# Patient Record
Sex: Female | Born: 1960 | Race: Black or African American | Hispanic: No | Marital: Married | Smoking: Never smoker
Health system: Southern US, Community
[De-identification: ages and names within clinical notes are randomized; demographics above are authoritative.]

## PROBLEM LIST (undated history)

## (undated) DIAGNOSIS — E119 Type 2 diabetes mellitus without complications: Secondary | ICD-10-CM

## (undated) DIAGNOSIS — R001 Bradycardia, unspecified: Secondary | ICD-10-CM

## (undated) DIAGNOSIS — E7800 Pure hypercholesterolemia, unspecified: Secondary | ICD-10-CM

## (undated) DIAGNOSIS — I10 Essential (primary) hypertension: Secondary | ICD-10-CM

## (undated) DIAGNOSIS — R011 Cardiac murmur, unspecified: Secondary | ICD-10-CM

## (undated) HISTORY — PX: CHOLECYSTECTOMY: SHX55

## (undated) HISTORY — PX: FOOT SURGERY: SHX648

## (undated) HISTORY — PX: ABDOMINAL HYSTERECTOMY: SHX81

---

## 2007-04-17 ENCOUNTER — Emergency Department (HOSPITAL_COMMUNITY): Admission: EM | Admit: 2007-04-17 | Discharge: 2007-04-17 | Payer: Self-pay | Admitting: Emergency Medicine

## 2007-09-07 ENCOUNTER — Encounter: Admission: RE | Admit: 2007-09-07 | Discharge: 2007-09-07 | Payer: Self-pay | Admitting: Family Medicine

## 2008-08-04 ENCOUNTER — Emergency Department (HOSPITAL_COMMUNITY): Admission: EM | Admit: 2008-08-04 | Discharge: 2008-08-04 | Payer: Self-pay | Admitting: Emergency Medicine

## 2008-09-11 ENCOUNTER — Encounter: Admission: RE | Admit: 2008-09-11 | Discharge: 2008-09-11 | Payer: Self-pay | Admitting: Family Medicine

## 2009-02-10 ENCOUNTER — Emergency Department (HOSPITAL_COMMUNITY): Admission: EM | Admit: 2009-02-10 | Discharge: 2009-02-10 | Payer: Self-pay | Admitting: Emergency Medicine

## 2009-02-21 ENCOUNTER — Ambulatory Visit (HOSPITAL_COMMUNITY): Admission: RE | Admit: 2009-02-21 | Discharge: 2009-02-21 | Payer: Self-pay | Admitting: Gastroenterology

## 2009-04-18 ENCOUNTER — Emergency Department (HOSPITAL_COMMUNITY): Admission: EM | Admit: 2009-04-18 | Discharge: 2009-04-19 | Payer: Self-pay | Admitting: Emergency Medicine

## 2009-04-30 ENCOUNTER — Ambulatory Visit: Admission: RE | Admit: 2009-04-30 | Discharge: 2009-04-30 | Payer: Self-pay | Admitting: Emergency Medicine

## 2009-04-30 ENCOUNTER — Ambulatory Visit: Payer: Self-pay | Admitting: Surgery

## 2009-04-30 ENCOUNTER — Encounter (INDEPENDENT_AMBULATORY_CARE_PROVIDER_SITE_OTHER): Payer: Self-pay | Admitting: Emergency Medicine

## 2009-11-19 ENCOUNTER — Emergency Department (HOSPITAL_COMMUNITY): Admission: EM | Admit: 2009-11-19 | Discharge: 2009-11-19 | Payer: Self-pay | Admitting: Emergency Medicine

## 2010-01-08 ENCOUNTER — Encounter: Admission: RE | Admit: 2010-01-08 | Discharge: 2010-01-08 | Payer: Self-pay | Admitting: Family Medicine

## 2010-08-13 ENCOUNTER — Inpatient Hospital Stay (HOSPITAL_COMMUNITY)
Admission: AD | Admit: 2010-08-13 | Discharge: 2010-08-15 | Payer: Self-pay | Source: Home / Self Care | Attending: Family Medicine | Admitting: Family Medicine

## 2010-08-13 LAB — CBC
HCT: 35.4 % — ABNORMAL LOW (ref 36.0–46.0)
Hemoglobin: 11.7 g/dL — ABNORMAL LOW (ref 12.0–15.0)
MCH: 30.5 pg (ref 26.0–34.0)
MCHC: 33.1 g/dL (ref 30.0–36.0)
MCV: 92.2 fL (ref 78.0–100.0)
Platelets: 218 10*3/uL (ref 150–400)
RBC: 3.84 MIL/uL — ABNORMAL LOW (ref 3.87–5.11)
RDW: 13 % (ref 11.5–15.5)
WBC: 10.4 10*3/uL (ref 4.0–10.5)

## 2010-08-13 LAB — DIFFERENTIAL
Basophils Absolute: 0 10*3/uL (ref 0.0–0.1)
Basophils Relative: 0 % (ref 0–1)
Eosinophils Absolute: 0.1 10*3/uL (ref 0.0–0.7)
Eosinophils Relative: 1 % (ref 0–5)
Lymphocytes Relative: 22 % (ref 12–46)
Lymphs Abs: 2.3 10*3/uL (ref 0.7–4.0)
Monocytes Absolute: 0.5 10*3/uL (ref 0.1–1.0)
Monocytes Relative: 5 % (ref 3–12)
Neutro Abs: 7.5 10*3/uL (ref 1.7–7.7)
Neutrophils Relative %: 72 % (ref 43–77)

## 2010-08-14 LAB — DIFFERENTIAL
Basophils Absolute: 0 10*3/uL (ref 0.0–0.1)
Basophils Relative: 0 % (ref 0–1)
Eosinophils Absolute: 0.1 10*3/uL (ref 0.0–0.7)
Eosinophils Relative: 2 % (ref 0–5)
Lymphocytes Relative: 24 % (ref 12–46)
Lymphs Abs: 2.2 10*3/uL (ref 0.7–4.0)
Monocytes Absolute: 0.7 10*3/uL (ref 0.1–1.0)
Monocytes Relative: 8 % (ref 3–12)
Neutro Abs: 5.9 10*3/uL (ref 1.7–7.7)
Neutrophils Relative %: 66 % (ref 43–77)

## 2010-08-14 LAB — CBC
HCT: 36.7 % (ref 36.0–46.0)
Hemoglobin: 12 g/dL (ref 12.0–15.0)
MCH: 30.6 pg (ref 26.0–34.0)
MCHC: 32.7 g/dL (ref 30.0–36.0)
MCV: 93.6 fL (ref 78.0–100.0)
Platelets: 219 10*3/uL (ref 150–400)
RBC: 3.92 MIL/uL (ref 3.87–5.11)
RDW: 13.2 % (ref 11.5–15.5)
WBC: 9 10*3/uL (ref 4.0–10.5)

## 2010-08-14 LAB — BASIC METABOLIC PANEL
BUN: 6 mg/dL (ref 6–23)
CO2: 26 mEq/L (ref 19–32)
Calcium: 9 mg/dL (ref 8.4–10.5)
Chloride: 107 mEq/L (ref 96–112)
Creatinine, Ser: 0.68 mg/dL (ref 0.4–1.2)
GFR calc Af Amer: >60 mL/min (ref >60)
GFR calc non Af Amer: >60 mL/min (ref >60)
Glucose, Bld: 113 mg/dL — ABNORMAL HIGH (ref 70–99)
Potassium: 3.8 mEq/L (ref 3.5–5.1)
Sodium: 140 mEq/L (ref 135–145)

## 2010-08-14 LAB — TSH: TSH: 1.328 u[IU]/mL (ref 0.350–4.500)

## 2010-08-15 LAB — DIFFERENTIAL
Basophils Absolute: 0 10*3/uL (ref 0.0–0.1)
Basophils Relative: 0 % (ref 0–1)
Eosinophils Absolute: 0.1 10*3/uL (ref 0.0–0.7)
Eosinophils Relative: 3 % (ref 0–5)
Lymphocytes Relative: 41 % (ref 12–46)
Lymphs Abs: 2.3 10*3/uL (ref 0.7–4.0)
Monocytes Absolute: 0.4 10*3/uL (ref 0.1–1.0)
Monocytes Relative: 7 % (ref 3–12)
Neutro Abs: 2.7 10*3/uL (ref 1.7–7.7)
Neutrophils Relative %: 49 % (ref 43–77)

## 2010-08-15 LAB — CBC
HCT: 36.4 % (ref 36.0–46.0)
Hemoglobin: 11.9 g/dL — ABNORMAL LOW (ref 12.0–15.0)
MCH: 30.8 pg (ref 26.0–34.0)
MCHC: 32.7 g/dL (ref 30.0–36.0)
MCV: 94.3 fL (ref 78.0–100.0)
Platelets: 231 10*3/uL (ref 150–400)
RBC: 3.86 MIL/uL — ABNORMAL LOW (ref 3.87–5.11)
RDW: 13.3 % (ref 11.5–15.5)
WBC: 5.6 10*3/uL (ref 4.0–10.5)

## 2010-08-17 ENCOUNTER — Emergency Department (HOSPITAL_COMMUNITY)
Admission: EM | Admit: 2010-08-17 | Discharge: 2010-08-17 | Payer: Self-pay | Source: Home / Self Care | Admitting: Emergency Medicine

## 2010-08-25 LAB — COMPREHENSIVE METABOLIC PANEL
ALT: 25 U/L (ref 0–35)
AST: 30 U/L (ref 0–37)
Albumin: 3.7 g/dL (ref 3.5–5.2)
Alkaline Phosphatase: 85 U/L (ref 39–117)
BUN: 11 mg/dL (ref 6–23)
CO2: 26 mEq/L (ref 19–32)
Calcium: 9.7 mg/dL (ref 8.4–10.5)
Chloride: 105 mEq/L (ref 96–112)
Creatinine, Ser: 0.84 mg/dL (ref 0.4–1.2)
GFR calc Af Amer: >60 mL/min (ref >60)
GFR calc non Af Amer: >60 mL/min (ref >60)
Glucose, Bld: 141 mg/dL — ABNORMAL HIGH (ref 70–99)
Potassium: 3.4 mEq/L — ABNORMAL LOW (ref 3.5–5.1)
Sodium: 140 mEq/L (ref 135–145)
Total Bilirubin: 0.3 mg/dL (ref 0.3–1.2)
Total Protein: 7.5 g/dL (ref 6.0–8.3)

## 2010-08-25 LAB — DIFFERENTIAL
Basophils Absolute: 0 10*3/uL (ref 0.0–0.1)
Basophils Relative: 1 % (ref 0–1)
Eosinophils Absolute: 0.1 10*3/uL (ref 0.0–0.7)
Eosinophils Relative: 2 % (ref 0–5)
Lymphocytes Relative: 41 % (ref 12–46)
Lymphs Abs: 2.6 10*3/uL (ref 0.7–4.0)
Monocytes Absolute: 0.4 10*3/uL (ref 0.1–1.0)
Monocytes Relative: 6 % (ref 3–12)
Neutro Abs: 3.2 10*3/uL (ref 1.7–7.7)
Neutrophils Relative %: 50 % (ref 43–77)

## 2010-08-25 LAB — POCT CARDIAC MARKERS
CKMB, poc: 1.4 ng/mL (ref 1.0–8.0)
Myoglobin, poc: 63 ng/mL (ref 12–200)
Troponin i, poc: <0.05 ng/mL (ref 0.00–0.09)

## 2010-08-25 LAB — CBC
HCT: 41.2 % (ref 36.0–46.0)
Hemoglobin: 13.7 g/dL (ref 12.0–15.0)
MCH: 31.6 pg (ref 26.0–34.0)
MCHC: 33.3 g/dL (ref 30.0–36.0)
MCV: 94.9 fL (ref 78.0–100.0)
Platelets: 253 10*3/uL (ref 150–400)
RBC: 4.34 MIL/uL (ref 3.87–5.11)
RDW: 12.9 % (ref 11.5–15.5)
WBC: 6.3 10*3/uL (ref 4.0–10.5)

## 2010-08-25 LAB — WOUND CULTURE

## 2010-08-25 LAB — LIPASE, BLOOD: Lipase: 30 U/L (ref 11–59)

## 2010-09-01 ENCOUNTER — Encounter: Payer: Self-pay | Admitting: Family Medicine

## 2010-09-08 NOTE — H&P (Signed)
Erin Craig, Erin Craig NO.:  [id number]  MEDICAL RECORD NO.:  [id number]          PATIENT TYPE:  INP  LOCATION:  4739                         FACILITY:  [facility]  PHYSICIAN:  Erin Compton, MD        DATE OF BIRTH:  1961/05/26  DATE OF ADMISSION:  08/13/2010 DATE OF DISCHARGE:                             HISTORY & PHYSICAL   PCP:  Erin Craig.  CHIEF COMPLAINT:  Cellulitis.  HISTORY OF PRESENT ILLNESS:  Erin Craig is 50 year old with a history of asymptomatic bradycardia who presents from urgent care with 3-day history of unresolving cellulitis with abscess on her right inner thigh and groin.  The patient first noticed small right tender swelling on her right thigh on Monday.  She presented to urgent care where her abscess was I and D, and she was prescribed doxycycline and Vicodin.  She felt like swelling and redness got worse and the redness got larger when she went home.  She started doxycycline and Vicodin last night, and has had 2 doses of doxycycline.  She returned to urgent care today and was then admitted to the hospital.  She has never had cellulitis before.  No other contacts with similar symptoms and no history of diabetes.  She also denied any symptoms of fever or chills.  PAST MEDICAL HISTORY:  Bradycardia - asymptomatic, followed by her cardiologist, Dr. Edwyna Craig.  PAST SURGICAL HISTORY:  Hysterectomy in 1997, emergent cholecystectomy in 1989.  SOCIAL HISTORY:  She lives with her husband and 3 daughters.   Denies tobacco and alcohol use, and other drug use.  FAMILY HISTORY:  Mother had breast cancer, and she endorses multiple other cases of cancer in her family.  ALLERGIES:  No known drug allergies.  MEDICATIONS:  None.  REVIEW OF SYSTEMS:  She denies fevers, chills, and sweats. CARDIOVASCULAR:  She denies chest pain.  RESPIRATORY:  Denies shortness of breath.  GI:  Denies nausea, vomiting, diarrhea, or abdominal pain. GU:  Denies dysuria,  hematuria, or abnormal vaginal discharge.  SKIN: Has tender, swelling, and groin upper thigh as per HPI.  PHYSICAL EXAMINATION:  VITAL SIGNS:  Temperature 98.1 Fahrenheit, pulse 68, respiratory rate 20, blood pressure 132/87, O2 sat 99% on room air, weight 72.4 kg. GENERAL:  A well appearing, in no acute distress. CARDIOVASCULAR:  Regular rate and rhythm.  No rubs, gallops, or murmurs. LUNGS:  Clear to auscultation bilaterally.  Normal workup breathing.  No wheezes, crackles, or rhonchi. ABDOMEN:  Soft, nontender.  Normal bowel sounds in all 4 quadrants. BACK:  No CVA tenderness. GU:  Approximately 5 x 8 cm area of tender induration with central incision that expresses some pus, induration is surrounded the large area of erythema over half of upper thigh.  Area is also very warm.  The incision is dressed and clean. EXTREMITIES:  No lower extremity edema. NEUROLOGIC:  Grossly intact. MSK:  Normal range of motion and strength.  LABS AND STUDIES:  None.  ASSESSMENT AND PLAN:  This is a 50 year old previously healthy female with history of only asymptomatic bradycardia who presents with worsening cellulitis with abscess of 3 days following  I and D at urgent care and 2 doses of doxycycline.  The patient has no systemic symptoms. No history of cellulitis or diabetes, and no other comorbidities. 1. Cellulitis.  Given rapid increase in redness and worsening     induration, the patient will need IV vancomycin to cover from MRSA,     pending culture results.  Incision was sampled for culture and     redressed.  We will check BNP for renal function and glucose to     ensure no diabetes.     a.     Basic metabolic panel, CBC in the morning, wound culture.     b.     IV vancomycin 1 g q.12 hours x7 days.     c.     Tylenol 650 mg p.o. q.6 hours p.r.n. pain. 2. Bradycardia.  The patient is currently asymptomatic.  She is     followed as an outpatient by cardiologist, Dr. Edwyna Craig.     a.      Telemetry and monitor for symptoms.     b.     TSH in the morning.     c.     EKG for baseline. 3. Prophylaxis.  Heparin 500 units subcu q.8 hours.     Erin Albee, MD   ______________________________ Erin Compton, MD    SN/MEDQ  D:  08/13/2010  T:  08/14/2010  Job:  [id number]  Electronically Signed by Erin Craig  on 09/04/2010 06:13:32 PM Electronically Signed by Erin Compton MD on 09/08/2010 08:32:15 AM

## 2010-09-08 NOTE — Discharge Summary (Signed)
Erin Craig, Erin Craig NO.:  [id number]  MEDICAL RECORD NO.:  [id number]          PATIENT TYPE:  INP  LOCATION:  4739                         FACILITY:  [facility]  PHYSICIAN:  Paula Compton, MD        DATE OF BIRTH:  [birth year]  DATE OF ADMISSION:  08/13/2010 DATE OF DISCHARGE:  08/15/2010                              DISCHARGE SUMMARY   DISCHARGE DIAGNOSES: 1. Cellulitis of the right thigh. 2. Asymptomatic bradycardia.  PROCEDURE:  None.  LABORATORY DATA: 1. CBC on August 13, 2010, with a white count 10.4, hemoglobin 11.7,     and platelet count 218. 2. BMET on August 14, 2010, sodium 140, potassium 3.8, chloride 107,     CO2 of 26, BUN 6, creatinine 0.68, glucose 113, and calcium 9.0. 3. TSH with a TSH of 1.23. 4. Wound culture on August 13, 2010, showing MRSA sensitive to     erythromycin, oxacillin, penicillin, and tetracycline.  BRIEF HOSPITAL COURSE: 1. This is a 50 year old female who was seen at urgent care     approximately 3 days prior to admission for a right groin     cellulitis to which the patient received a very short course of     oral doxycycline with subsequent increasing induration, swelling,     subjective fevers and chills as well as white count.  The patient     was admitted in-house for IV antibiotic management of cellulitis     infection.  The patient was continued on IV vancomycin while in-     house with repeat cultures with abscess culture showing MRSA.  The     patient was continued on IV antibiotics depending sensitivities of     abscess culture.  The patient was subsequently switched to     previously dosed oral doxycycline sensitivities as culture     sensitivities shows sensitivity to doxycycline.  At the time of     discharge, the patient was afebrile with a normal white count,     otherwise back at baseline with marked improvement in overall     redness and swelling of the genital area.  1. Bradycardia.  The patient is noted  to have history of asymptomatic     bradycardia being seen in the outpatient cardiologist, Dr. Edwyna Shell.     The patient was placed on telemetry with no noted symptomatic     bradycardic events with TSH also being within normal limits and her     baseline EKG showing sinus bradycardia.  No medications were     administered during the hospitalization for correction of     bradycardia.  The patient was instructed to follow up with     outpatient cardiologist as needed at discharge.  1. Follow up.  The patient instructed to follow up with urgent care in     the next 1-2 weeks for followup of cellulitis.  The patient is also     instructed to follow up with cardiologist as needed for     asymptomatic bradycardia.     Doree Albee, MD   ______________________________ Paula Compton, MD  SN/MEDQ  D:  09/07/2010  T:  09/07/2010  Job:  [id number]  Electronically Signed by Doree Albee  on 09/08/2010 05:34:13 AM Electronically Signed by Paula Compton MD on 09/08/2010 08:32:25 AM

## 2010-10-09 ENCOUNTER — Ambulatory Visit
Admission: RE | Admit: 2010-10-09 | Discharge: 2010-10-09 | Disposition: A | Discharge status: Home / Self Care | Payer: BC Managed Care – PPO | Source: Ambulatory Visit | Attending: Family Medicine | Admitting: Family Medicine

## 2010-10-09 ENCOUNTER — Other Ambulatory Visit: Payer: Self-pay | Admitting: Family Medicine

## 2010-10-09 DIAGNOSIS — Z0000 Encounter for general adult medical examination without abnormal findings: Secondary | ICD-10-CM

## 2010-10-09 DIAGNOSIS — Z1231 Encounter for screening mammogram for malignant neoplasm of breast: Secondary | ICD-10-CM

## 2010-10-15 ENCOUNTER — Emergency Department (HOSPITAL_COMMUNITY): Payer: BC Managed Care – PPO

## 2010-10-15 ENCOUNTER — Other Ambulatory Visit (INDEPENDENT_AMBULATORY_CARE_PROVIDER_SITE_OTHER): Payer: BC Managed Care – PPO

## 2010-10-15 ENCOUNTER — Emergency Department (HOSPITAL_COMMUNITY)
Admission: EM | Admit: 2010-10-15 | Discharge: 2010-10-15 | Disposition: A | Discharge status: Home / Self Care | Payer: BC Managed Care – PPO | Attending: Emergency Medicine | Admitting: Emergency Medicine

## 2010-10-15 DIAGNOSIS — R5383 Other fatigue: Secondary | ICD-10-CM

## 2010-10-15 DIAGNOSIS — R5381 Other malaise: Secondary | ICD-10-CM | POA: Insufficient documentation

## 2010-10-15 DIAGNOSIS — R002 Palpitations: Secondary | ICD-10-CM

## 2010-10-15 DIAGNOSIS — R0789 Other chest pain: Secondary | ICD-10-CM | POA: Insufficient documentation

## 2010-10-15 DIAGNOSIS — I498 Other specified cardiac arrhythmias: Secondary | ICD-10-CM | POA: Insufficient documentation

## 2010-10-15 DIAGNOSIS — R42 Dizziness and giddiness: Secondary | ICD-10-CM | POA: Insufficient documentation

## 2010-10-15 DIAGNOSIS — R110 Nausea: Secondary | ICD-10-CM | POA: Insufficient documentation

## 2010-10-15 DIAGNOSIS — R55 Syncope and collapse: Secondary | ICD-10-CM | POA: Insufficient documentation

## 2010-10-15 DIAGNOSIS — M25519 Pain in unspecified shoulder: Secondary | ICD-10-CM | POA: Insufficient documentation

## 2010-10-15 LAB — POCT I-STAT, CHEM 8
BUN: 17 mg/dL (ref 6–23)
Calcium, Ion: 1.13 mmol/L (ref 1.12–1.32)
Chloride: 104 mEq/L (ref 96–112)
Creatinine, Ser: 0.9 mg/dL (ref 0.4–1.2)
Glucose, Bld: 115 mg/dL — ABNORMAL HIGH (ref 70–99)
HCT: 39 % (ref 36.0–46.0)
Hemoglobin: 13.3 g/dL (ref 12.0–15.0)
Potassium: 3.5 mEq/L (ref 3.5–5.1)
Sodium: 139 mEq/L (ref 135–145)
TCO2: 26 mmol/L (ref 0–100)

## 2010-10-15 LAB — POCT CARDIAC MARKERS
CKMB, poc: 2.2 ng/mL (ref 1.0–8.0)
Myoglobin, poc: 62 ng/mL (ref 12–200)
Troponin i, poc: <0.05 ng/mL (ref 0.00–0.09)

## 2010-10-15 LAB — CBC
HCT: 38.7 % (ref 36.0–46.0)
Hemoglobin: 12.7 g/dL (ref 12.0–15.0)
MCH: 30.3 pg (ref 26.0–34.0)
MCHC: 32.8 g/dL (ref 30.0–36.0)
MCV: 92.4 fL (ref 78.0–100.0)
Platelets: 203 10*3/uL (ref 150–400)
RBC: 4.19 MIL/uL (ref 3.87–5.11)
RDW: 13.3 % (ref 11.5–15.5)
WBC: 7.9 10*3/uL (ref 4.0–10.5)

## 2010-10-15 LAB — DIFFERENTIAL
Basophils Absolute: 0 10*3/uL (ref 0.0–0.1)
Basophils Relative: 0 % (ref 0–1)
Eosinophils Absolute: 0.1 10*3/uL (ref 0.0–0.7)
Eosinophils Relative: 1 % (ref 0–5)
Lymphocytes Relative: 35 % (ref 12–46)
Lymphs Abs: 2.7 10*3/uL (ref 0.7–4.0)
Monocytes Absolute: 0.5 10*3/uL (ref 0.1–1.0)
Monocytes Relative: 7 % (ref 3–12)
Neutro Abs: 4.5 10*3/uL (ref 1.7–7.7)
Neutrophils Relative %: 58 % (ref 43–77)

## 2010-10-22 ENCOUNTER — Ambulatory Visit: Payer: BC Managed Care – PPO | Admitting: Cardiology

## 2010-10-22 ENCOUNTER — Ambulatory Visit (INDEPENDENT_AMBULATORY_CARE_PROVIDER_SITE_OTHER): Payer: BC Managed Care – PPO | Admitting: Nurse Practitioner

## 2010-10-22 DIAGNOSIS — I495 Sick sinus syndrome: Secondary | ICD-10-CM

## 2010-10-22 DIAGNOSIS — R42 Dizziness and giddiness: Secondary | ICD-10-CM

## 2010-10-29 ENCOUNTER — Other Ambulatory Visit (HOSPITAL_COMMUNITY): Payer: Self-pay | Admitting: Radiology

## 2010-10-29 DIAGNOSIS — R001 Bradycardia, unspecified: Secondary | ICD-10-CM

## 2010-10-29 NOTE — Consult Note (Signed)
NAME:  Erin Craig, FASS NO.:  [id number]  MEDICAL RECORD NO.:  [id number]           PATIENT TYPE:  E  LOCATION:  [facility]                         FACILITY:  [facility]  PHYSICIAN:  Colleen Can. Deborah Chalk, M.D.DATE OF BIRTH:  [birth year]  DATE OF CONSULTATION:  10/15/2010 DATE OF DISCHARGE:  10/15/2010                                CONSULTATION   PRIMARY CARDIOLOGIST:  Peter M. Swaziland, MD  PRIMARY CARE PROVIDER:  Cephas Darby. Daphine Deutscher, MD  GASTROENTEROLOGIST:  Jordan Hawks. Elnoria Howard, MD  CHIEF COMPLAINT:  Generalized weakness.  HISTORY OF PRESENT ILLNESS:  This is a 50 year old African American female with history of asymptomatic bradycardia who presented to the emergency department with complaints of generalized weakness.  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  She states that she awoke around 2:00 a.m. this morning and just did not feel right.  She proceeded to work and continued to feel weak.  A coworker stated that the patient looked pale, and the patient stated that she felt lightheaded and nauseated at this time.  The patient denied any associated shortness of breath, syncope, diaphoresis, vomiting, or chest pain.  She did state she had shoulder pain on her left side this morning when she was reaching for boxes above her head. This subsided when her arms were returned to her side position.  The patient's daughter brought her to the emergency department for evaluation.  In the emergency department, she was found to have a rate in the mid 40s to low 50s.  Her pressures have been stable.  She currently states she feels better, and her symptoms had resolved prior to reaching [facility].  Of note, the patient was hospitalized in January 2012 for cellulitis, at this time the patient was on telemetry and was noted to be bradycardic but again asymptomatic.  The only change  in medication has been with the addition of hyoscyamine for GI complaints, this was added by Dr. Elnoria Howard last week.  The patient denies any other change in medication or bowel habits.  She states she is still having cramping.  PAST MEDICAL HISTORY: 1. Asymptomatic bradycardia. 2. Eczema status post steroid injection last week. 3. Status post partial hysterectomy in 1997. 4. Status post emergent cholecystectomy in 1989.  SOCIAL HISTORY:  The patient lives in [place] with her husband.  She has three daughters and a son.  She is a Production designer, theatre/television/film at [employer].  She denies any tobacco, alcohol, or illicit drug use.  She exercises regularly.  She tries to eat diet full of fruits and vegetables and stay away from fried foods.  FAMILY HISTORY:  Her mother is alive at the age of 52 with breast cancer.  Her father passed away in his 4s from cancer.  She has a brother with hypertension.  She has sisters with hypertension and diabetes mellitus.  She denies any early coronary artery disease.  ALLERGIES:  No known drug allergies.  HOME MEDICATIONS: 1. Hyoscyamine 0.125 mg one tablet four times a day. 2. Hydroxyzine 10  mg one to three tablets p.o. q.4-6 h. as needed for     itching.  REVIEW OF SYSTEMS:  All pertinent positives and negatives as stated in HPI.  All other systems have been reviewed and are negative.  PHYSICAL EXAMINATION:  VITAL SIGNS:  Temperature 98.4, pulse 47, respirations 20, blood pressure 139/69, O2 saturation 100% on room air. GENERAL:  This is a pleasant-appearing female, she appears younger than her stated age.  She is in no acute distress. HEENT:  Normal. NECK:  Supple without bruit or JVD. HEART:  Regular rate and rhythm with S1 and S2.  No murmur, rub, or gallop noted.  Pulses are 2+ and equal bilaterally. LUNGS:  Clear to auscultation bilaterally without wheezes, rales, or rhonchi. ABDOMEN:  Soft, nontender. EXTREMITIES:  No clubbing, cyanosis, or  edema. MUSCULOSKELETAL:  No joint deformities or effusions. NEUROLOGIC:  Alert and oriented x3, cranial nerves II through XII grossly intact.  Chest x-ray showing no active cardiopulmonary disease. EKG showing sinus bradycardia at a rate of 44 beats per minute.  There are no acute ST or T-wave changes.  No Q-waves.  LABORATORY DATA:  WBC is 7.9, hemoglobin 13.3, hematocrit 39, platelets 203.  Sodium 139, potassium 3.5, chloride 104, bicarb 26, BUN 17, creatinine 0.9.  Point-of-care markers negative x1.  TSH on August 21, 2010, is 1.23.  ASSESSMENT AND PLAN:  This is a 50 year old female with known history of asymptomatic bradycardia who presents with questionable symptomatic bradycardia.  EKG showing sinus bradycardia without acute changes.  The patient's lab work is unremarkable.  The patient's symptoms are currently resolved.  Question if symptoms are secondary to bradycardia that could be brought on from a vagal response with the patient's cramping versus physiological etiology.  At this time, we recommend that the patient can be discharged from the emergency department and plan for a Holter monitor evaluation.  The patient will pick up Holter monitor today from Dr. Elvis Coil office.  The patient also has an exercise stress test to check for chronotropic competence on October 30, 2010, at 9:45 a.m.  At this time, she will also have an echo to evaluate the structure of her heart at 8:30.  The patient will follow up with Dr. Swaziland on October 22, 2010, at 8:30 for further outpatient monitoring.  Further treatment will be dependent upon the above results.  Thank you for this evaluation.     Leonette Monarch, PA-C   ______________________________ Colleen Can Deborah Chalk, M.D.    NB/MEDQ  D:  10/15/2010  T:  10/16/2010  Job:  [id number]  cc:   Jordan Hawks. Elnoria Howard, MD Cephas Darby. Daphine Deutscher, M.D. Peter M. Swaziland, M.D.  Electronically Signed by Alen Blew P.A. on 10/16/2010 05:57:55  PM Electronically Signed by Roger Shelter M.D. on 10/29/2010 04:16:15 PM

## 2010-10-30 ENCOUNTER — Ambulatory Visit (HOSPITAL_COMMUNITY): Payer: BC Managed Care – PPO | Attending: Cardiology | Admitting: Radiology

## 2010-10-30 ENCOUNTER — Ambulatory Visit (HOSPITAL_BASED_OUTPATIENT_CLINIC_OR_DEPARTMENT_OTHER): Payer: BC Managed Care – PPO | Admitting: Radiology

## 2010-10-30 DIAGNOSIS — R0609 Other forms of dyspnea: Secondary | ICD-10-CM

## 2010-10-30 DIAGNOSIS — I495 Sick sinus syndrome: Secondary | ICD-10-CM

## 2010-10-30 DIAGNOSIS — R0602 Shortness of breath: Secondary | ICD-10-CM

## 2010-10-30 DIAGNOSIS — I498 Other specified cardiac arrhythmias: Secondary | ICD-10-CM | POA: Insufficient documentation

## 2010-10-30 DIAGNOSIS — I4949 Other premature depolarization: Secondary | ICD-10-CM

## 2010-10-30 DIAGNOSIS — I491 Atrial premature depolarization: Secondary | ICD-10-CM

## 2010-10-30 DIAGNOSIS — R0989 Other specified symptoms and signs involving the circulatory and respiratory systems: Secondary | ICD-10-CM

## 2010-10-30 DIAGNOSIS — R001 Bradycardia, unspecified: Secondary | ICD-10-CM

## 2010-10-30 MED ORDER — REGADENOSON 0.4 MG/5ML IV SOLN
0.4000 mg | Freq: Once | INTRAVENOUS | Status: AC
  Administered 2010-10-30: 0.4 mg via INTRAVENOUS

## 2010-10-30 MED ORDER — TECHNETIUM TC 99M TETROFOSMIN IV KIT
11.0000 | PACK | Freq: Once | INTRAVENOUS | Status: AC | PRN
  Administered 2010-10-30: 11 via INTRAVENOUS

## 2010-10-30 MED ORDER — REGADENOSON 0.4 MG/5ML IV SOLN: 0.4000 mg | Freq: Once | INTRAVENOUS | Status: DC

## 2010-10-30 MED ORDER — TECHNETIUM TC 99M TETROFOSMIN IV KIT
33.0000 | PACK | Freq: Once | INTRAVENOUS | Status: AC | PRN
  Administered 2010-10-30: 33 via INTRAVENOUS

## 2010-10-30 NOTE — Progress Notes (Signed)
[facility] SITE 3 NUCLEAR MED [address] [place] [address] [phone]  Cardiology Nuclear Med Erin Craig Cargo female March 07, 1961   Nuclear Med Background Indication for Stress Test:  Evaluation for Ischemia and Post Hospital on 10/15/10 near syncope,and nausea, (-) enzymes. History:  Bradycardia Cardiac Risk Factors: Family History - CAD  Symptoms:  Dizziness, DOE, Fatigue, Fatigue with Exertion, Light-Headedness, Nausea, Near Syncope, Left shoulder and arm pain.   Nuclear Pre-Procedure Caffeine/Decaff Intake:  None NPO After: 8:00pm   Lungs:  Clear IV 0.9% NS with Angio Cath:  22g  IV Site: R Hand  IV Started by:  Irean Hong, RN  Chest Size (in):  38 Cup Size:   C  Height: 5\' 4"  (1.626 m)  Weight:  163 lb (73.936 kg)  BMI:  Body mass index is 27.98 kg/(m^2). Tech Comments:  none    Nuclear Med Study 1 or 2 day study: 1 day  Stress Test Type:  Treadmill/Lexiscan  Reading MD: Olga Millers, MD  Order Authorizing Provider: Roger Shelter, MD  Resting Radionuclide: Technetium 49m Tetrofosmin  Resting Radionuclide Dose: 11 mCi   Stress Radionuclide:  Technetium 23m Tetrofosmin  Stress Radionuclide Dose: 33 mCi           Stress Protocol Rest HR: 38 Stress HR: 142  Rest BP: 99/70 Stress BP: 148/68  Exercise Time:  12:00 min METS: 12.1  Predicted HR: 171 % of Maximum: 83.04    Predicted Max HR: 171 bpm % Max HR: 83.04 bpm Rate Pressure Product: 95621    Dose of Adenosine:  NA mg Dose of Lexiscan:  0.4 mg  Dose of Atropine:  NA mg Dose of Dobutamine:  NA mcg/kg/min (at max HR)  Stress Test Technologist: Irean Hong, RN  Nuclear Technologist:  Domenic Polite, CNMT     Rest Procedure:  Myocardial perfusion imaging was performed at rest 45 minutes following the intravenous administration of Technetium 38m Tetrofosmin. Rest ECG: Marked SB rate 38, reviewed EKG  by [facility] with OK to walk the patient on treadmill, and change to  lexiscan if unable to reach target heart rate.  Stress Procedure:The patient attempted to walk treadmill utilizing the Bruce protocol for 8:30, but was unable to reach her target heart rate.  The patient received IV Lexiscan 0.4 mg over 15-seconds with concurrent low level exercise and then Technetium 62m Tetrofosmin was injected at 30-seconds.  There were nonspecific changes with Lexiscan, rare PVC.PAC.  Quantitative spect images were obtained after a 45-minute delay. Stress ECG: No significant change from baseline ECG  QPS Raw Data Images:  Mild breast attenuation.  Normal left ventricular size. Stress Images:  There is decreased uptake in the anterior wall. Rest Images:  There is decreased uptake in the anterior wall. Subtraction (SDS):  There is a fixed anteriour defect that is most consistent with breast attenuation.  Findings Risk Category:  Normal nuclear study. Clinically Abnormal:  No Ischemia:  No Fixed Defect:  Anterior (septal apical) LV Dysfunction:  No Transient Ischemic Dilatation (Normal <1.22): .94 Lung/Heart Ratio (Normal <0.45):  .33  Quantitative Gated Spect Images QGS EDV:  105 ml QGS ESV:  35 ml QGS cine images:  Normal wall motion. QGS EF:  66 %  Impression Exercise Capacity:  Lexiscan with no exercise. BP Response:  Normal blood pressure response. Clinical Symptoms:  No chest pain. ECG Impression:  No significant ST segment change suggestive of ischemia. Comparison with Prior Nuclear Study: No previous nuclear study performed  Overall Impression:  Normal lexiscan nuclear study with breast attenuation but no ischemia.

## 2010-11-03 ENCOUNTER — Telehealth: Payer: Self-pay | Admitting: *Deleted

## 2010-11-03 NOTE — Telephone Encounter (Signed)
Notified of echo results.

## 2010-11-14 LAB — POCT I-STAT, CHEM 8
BUN: 12 mg/dL (ref 6–23)
Calcium, Ion: 1.15 mmol/L (ref 1.12–1.32)
Chloride: 103 mEq/L (ref 96–112)
Creatinine, Ser: 0.7 mg/dL (ref 0.4–1.2)
Glucose, Bld: 144 mg/dL — ABNORMAL HIGH (ref 70–99)
HCT: 39 % (ref 36.0–46.0)
Hemoglobin: 13.3 g/dL (ref 12.0–15.0)
Potassium: 3.5 mEq/L (ref 3.5–5.1)
Sodium: 138 mEq/L (ref 135–145)
TCO2: 25 mmol/L (ref 0–100)

## 2010-11-14 LAB — D-DIMER, QUANTITATIVE: D-Dimer, Quant: 0.25 ug/mL-FEU (ref 0.00–0.48)

## 2010-11-16 LAB — URINE CULTURE: Colony Count: 4000

## 2010-11-16 LAB — DIFFERENTIAL
Basophils Absolute: 0 10*3/uL (ref 0.0–0.1)
Basophils Relative: 1 % (ref 0–1)
Eosinophils Absolute: 0.1 10*3/uL (ref 0.0–0.7)
Eosinophils Relative: 1 % (ref 0–5)
Lymphocytes Relative: 41 % (ref 12–46)
Lymphs Abs: 2.4 10*3/uL (ref 0.7–4.0)
Monocytes Absolute: 0.4 10*3/uL (ref 0.1–1.0)
Monocytes Relative: 6 % (ref 3–12)
Neutro Abs: 3 10*3/uL (ref 1.7–7.7)
Neutrophils Relative %: 52 % (ref 43–77)

## 2010-11-16 LAB — URINALYSIS, ROUTINE W REFLEX MICROSCOPIC
Bilirubin Urine: NEGATIVE
Glucose, UA: NEGATIVE mg/dL
Ketones, ur: NEGATIVE mg/dL
Nitrite: NEGATIVE
Protein, ur: NEGATIVE mg/dL
Specific Gravity, Urine: 1.01 (ref 1.005–1.030)
Urobilinogen, UA: 0.2 mg/dL (ref 0.0–1.0)
pH: 6 (ref 5.0–8.0)

## 2010-11-16 LAB — CBC
HCT: 38.5 % (ref 36.0–46.0)
Hemoglobin: 12.7 g/dL (ref 12.0–15.0)
MCHC: 33 g/dL (ref 30.0–36.0)
MCV: 94.8 fL (ref 78.0–100.0)
Platelets: 200 10*3/uL (ref 150–400)
RBC: 4.06 MIL/uL (ref 3.87–5.11)
RDW: 13.1 % (ref 11.5–15.5)
WBC: 5.9 10*3/uL (ref 4.0–10.5)

## 2010-11-16 LAB — LACTIC ACID, PLASMA: Lactic Acid, Venous: 0.7 mmol/L (ref 0.5–2.2)

## 2010-11-16 LAB — POCT CARDIAC MARKERS
CKMB, poc: <1 ng/mL — ABNORMAL LOW (ref 1.0–8.0)
CKMB, poc: <1 ng/mL — ABNORMAL LOW (ref 1.0–8.0)
Myoglobin, poc: 58.3 ng/mL (ref 12–200)
Myoglobin, poc: 67.1 ng/mL (ref 12–200)
Troponin i, poc: <0.05 ng/mL (ref 0.00–0.09)
Troponin i, poc: <0.05 ng/mL (ref 0.00–0.09)

## 2010-11-16 LAB — COMPREHENSIVE METABOLIC PANEL
ALT: 16 U/L (ref 0–35)
AST: 16 U/L (ref 0–37)
Albumin: 3.6 g/dL (ref 3.5–5.2)
Alkaline Phosphatase: 65 U/L (ref 39–117)
BUN: 9 mg/dL (ref 6–23)
CO2: 27 mEq/L (ref 19–32)
Calcium: 8.6 mg/dL (ref 8.4–10.5)
Chloride: 108 mEq/L (ref 96–112)
Creatinine, Ser: 0.68 mg/dL (ref 0.4–1.2)
GFR calc Af Amer: >60 mL/min (ref >60)
GFR calc non Af Amer: >60 mL/min (ref >60)
Glucose, Bld: 98 mg/dL (ref 70–99)
Potassium: 3.6 mEq/L (ref 3.5–5.1)
Sodium: 138 mEq/L (ref 135–145)
Total Bilirubin: 0.6 mg/dL (ref 0.3–1.2)
Total Protein: 6.8 g/dL (ref 6.0–8.3)

## 2010-11-16 LAB — POCT I-STAT, CHEM 8
BUN: 10 mg/dL (ref 6–23)
Calcium, Ion: 1.16 mmol/L (ref 1.12–1.32)
Chloride: 106 mEq/L (ref 96–112)
Creatinine, Ser: 0.7 mg/dL (ref 0.4–1.2)
Glucose, Bld: 94 mg/dL (ref 70–99)
HCT: 40 % (ref 36.0–46.0)
Hemoglobin: 13.6 g/dL (ref 12.0–15.0)
Potassium: 3.7 mEq/L (ref 3.5–5.1)
Sodium: 140 mEq/L (ref 135–145)
TCO2: 25 mmol/L (ref 0–100)

## 2010-11-16 LAB — URINE MICROSCOPIC-ADD ON

## 2010-11-16 LAB — LIPASE, BLOOD: Lipase: 22 U/L (ref 11–59)

## 2011-02-06 ENCOUNTER — Encounter: Payer: Self-pay | Admitting: Cardiology

## 2011-02-12 ENCOUNTER — Telehealth: Payer: Self-pay | Admitting: Cardiology

## 2011-02-12 NOTE — Telephone Encounter (Signed)
Called because her foot randomly swells and it only goes down when she elevates her foot. Please call back. I have pulled her chart.

## 2011-02-12 NOTE — Telephone Encounter (Signed)
Called stating for past 2 wks legs have been swelling. They go down at night. Asked if she is watching her salt. She states she does not add salt and watches in foods. Went to urgent care about 4 wks ago w/ same thing. They did CBC which was normal. States she does stand 12-13 hrs a day. Dr. Swaziland advised to watch salt intact and may need to wear support hose. Is hesitant to give Lasix because when came in in March was c/o dizziness,lightheadness.

## 2011-04-08 ENCOUNTER — Emergency Department (HOSPITAL_COMMUNITY)
Admission: EM | Admit: 2011-04-08 | Discharge: 2011-04-09 | Disposition: A | Discharge status: Home / Self Care | Payer: BC Managed Care – PPO | Attending: Emergency Medicine | Admitting: Emergency Medicine

## 2011-04-08 ENCOUNTER — Emergency Department (HOSPITAL_COMMUNITY): Payer: BC Managed Care – PPO

## 2011-04-08 DIAGNOSIS — E876 Hypokalemia: Secondary | ICD-10-CM | POA: Insufficient documentation

## 2011-04-08 DIAGNOSIS — R002 Palpitations: Secondary | ICD-10-CM | POA: Insufficient documentation

## 2011-04-08 DIAGNOSIS — R079 Chest pain, unspecified: Secondary | ICD-10-CM | POA: Insufficient documentation

## 2011-04-09 LAB — DIFFERENTIAL
Basophils Absolute: 0 10*3/uL (ref 0.0–0.1)
Basophils Relative: 0 % (ref 0–1)
Eosinophils Absolute: 0.1 10*3/uL (ref 0.0–0.7)
Eosinophils Relative: 2 % (ref 0–5)
Lymphocytes Relative: 40 % (ref 12–46)
Lymphs Abs: 2.4 10*3/uL (ref 0.7–4.0)
Monocytes Absolute: 0.5 10*3/uL (ref 0.1–1.0)
Monocytes Relative: 9 % (ref 3–12)
Neutro Abs: 3 10*3/uL (ref 1.7–7.7)
Neutrophils Relative %: 49 % (ref 43–77)

## 2011-04-09 LAB — CBC
HCT: 39.5 % (ref 36.0–46.0)
Hemoglobin: 13 g/dL (ref 12.0–15.0)
MCH: 30.7 pg (ref 26.0–34.0)
MCHC: 32.9 g/dL (ref 30.0–36.0)
MCV: 93.2 fL (ref 78.0–100.0)
Platelets: 213 10*3/uL (ref 150–400)
RBC: 4.24 MIL/uL (ref 3.87–5.11)
RDW: 13 % (ref 11.5–15.5)
WBC: 6.1 10*3/uL (ref 4.0–10.5)

## 2011-04-09 LAB — CK TOTAL AND CKMB (NOT AT ARMC)
CK, MB: 3.3 ng/mL (ref 0.3–4.0)
Relative Index: 1.9 (ref 0.0–2.5)
Total CK: 173 U/L (ref 7–177)

## 2011-04-09 LAB — BASIC METABOLIC PANEL
BUN: 13 mg/dL (ref 6–23)
CO2: 26 mEq/L (ref 19–32)
Calcium: 9.4 mg/dL (ref 8.4–10.5)
Chloride: 103 mEq/L (ref 96–112)
Creatinine, Ser: 0.69 mg/dL (ref 0.50–1.10)
GFR calc Af Amer: >60 mL/min (ref >60)
GFR calc non Af Amer: >60 mL/min (ref >60)
Glucose, Bld: 108 mg/dL — ABNORMAL HIGH (ref 70–99)
Potassium: 3.3 mEq/L — ABNORMAL LOW (ref 3.5–5.1)
Sodium: 138 mEq/L (ref 135–145)

## 2011-04-09 LAB — TROPONIN I: Troponin I: <0.3 ng/mL (ref <0.30)

## 2011-05-11 ENCOUNTER — Emergency Department (HOSPITAL_COMMUNITY)
Admission: EM | Admit: 2011-05-11 | Discharge: 2011-05-11 | Disposition: A | Discharge status: Home / Self Care | Payer: BC Managed Care – PPO | Attending: Emergency Medicine | Admitting: Emergency Medicine

## 2011-05-11 DIAGNOSIS — R109 Unspecified abdominal pain: Secondary | ICD-10-CM | POA: Insufficient documentation

## 2011-05-11 DIAGNOSIS — Z8614 Personal history of Methicillin resistant Staphylococcus aureus infection: Secondary | ICD-10-CM | POA: Insufficient documentation

## 2011-05-11 DIAGNOSIS — R110 Nausea: Secondary | ICD-10-CM | POA: Insufficient documentation

## 2011-05-11 DIAGNOSIS — Z90710 Acquired absence of both cervix and uterus: Secondary | ICD-10-CM | POA: Insufficient documentation

## 2011-05-11 DIAGNOSIS — Z9089 Acquired absence of other organs: Secondary | ICD-10-CM | POA: Insufficient documentation

## 2011-05-11 DIAGNOSIS — K922 Gastrointestinal hemorrhage, unspecified: Secondary | ICD-10-CM | POA: Insufficient documentation

## 2011-05-11 LAB — BASIC METABOLIC PANEL
BUN: 12 mg/dL (ref 6–23)
CO2: 26 mEq/L (ref 19–32)
Calcium: 9 mg/dL (ref 8.4–10.5)
Chloride: 105 mEq/L (ref 96–112)
Creatinine, Ser: 0.54 mg/dL (ref 0.50–1.10)
GFR calc Af Amer: >90 mL/min (ref >90)
GFR calc non Af Amer: >90 mL/min (ref >90)
Glucose, Bld: 115 mg/dL — ABNORMAL HIGH (ref 70–99)
Potassium: 3.6 mEq/L (ref 3.5–5.1)
Sodium: 138 mEq/L (ref 135–145)

## 2011-05-11 LAB — CBC
HCT: 37.6 % (ref 36.0–46.0)
Hemoglobin: 12.2 g/dL (ref 12.0–15.0)
MCH: 30.1 pg (ref 26.0–34.0)
MCHC: 32.4 g/dL (ref 30.0–36.0)
MCV: 92.8 fL (ref 78.0–100.0)
Platelets: 208 10*3/uL (ref 150–400)
RBC: 4.05 MIL/uL (ref 3.87–5.11)
RDW: 13 % (ref 11.5–15.5)
WBC: 5.5 10*3/uL (ref 4.0–10.5)

## 2011-05-11 LAB — OCCULT BLOOD, POC DEVICE: Fecal Occult Bld: NEGATIVE

## 2011-05-15 LAB — DIFFERENTIAL
Basophils Absolute: 0 10*3/uL (ref 0.0–0.1)
Basophils Relative: 0 % (ref 0–1)
Eosinophils Absolute: 0.1 10*3/uL (ref 0.0–0.7)
Eosinophils Relative: 2 % (ref 0–5)
Lymphocytes Relative: 36 % (ref 12–46)
Lymphs Abs: 2.1 10*3/uL (ref 0.7–4.0)
Monocytes Absolute: 0.4 10*3/uL (ref 0.1–1.0)
Monocytes Relative: 7 % (ref 3–12)
Neutro Abs: 3.2 10*3/uL (ref 1.7–7.7)
Neutrophils Relative %: 55 % (ref 43–77)

## 2011-05-15 LAB — COMPREHENSIVE METABOLIC PANEL
ALT: 18 U/L (ref 0–35)
AST: 19 U/L (ref 0–37)
Albumin: 3.7 g/dL (ref 3.5–5.2)
Alkaline Phosphatase: 63 U/L (ref 39–117)
BUN: 9 mg/dL (ref 6–23)
CO2: 26 mEq/L (ref 19–32)
Calcium: 9.2 mg/dL (ref 8.4–10.5)
Chloride: 105 mEq/L (ref 96–112)
Creatinine, Ser: 0.68 mg/dL (ref 0.4–1.2)
GFR calc Af Amer: >60 mL/min (ref >60)
GFR calc non Af Amer: >60 mL/min (ref >60)
Glucose, Bld: 122 mg/dL — ABNORMAL HIGH (ref 70–99)
Potassium: 4 mEq/L (ref 3.5–5.1)
Sodium: 140 mEq/L (ref 135–145)
Total Bilirubin: 0.7 mg/dL (ref 0.3–1.2)
Total Protein: 7.1 g/dL (ref 6.0–8.3)

## 2011-05-15 LAB — CBC
HCT: 39.2 % (ref 36.0–46.0)
Hemoglobin: 13.1 g/dL (ref 12.0–15.0)
MCHC: 33.5 g/dL (ref 30.0–36.0)
MCV: 93 fL (ref 78.0–100.0)
Platelets: 218 10*3/uL (ref 150–400)
RBC: 4.21 MIL/uL (ref 3.87–5.11)
RDW: 13.3 % (ref 11.5–15.5)
WBC: 5.8 10*3/uL (ref 4.0–10.5)

## 2011-05-15 LAB — LIPASE, BLOOD: Lipase: 28 U/L (ref 11–59)

## 2011-05-22 LAB — URINALYSIS, ROUTINE W REFLEX MICROSCOPIC
Bilirubin Urine: NEGATIVE
Glucose, UA: NEGATIVE
Ketones, ur: NEGATIVE
Nitrite: POSITIVE — AB
Protein, ur: 100 — AB
Specific Gravity, Urine: 1.023
Urobilinogen, UA: 0.2
pH: 5.5

## 2011-05-22 LAB — URINE MICROSCOPIC-ADD ON

## 2011-07-19 ENCOUNTER — Emergency Department (HOSPITAL_COMMUNITY)
Admission: EM | Admit: 2011-07-19 | Discharge: 2011-07-19 | Disposition: A | Discharge status: Home / Self Care | Payer: BC Managed Care – PPO | Attending: Emergency Medicine | Admitting: Emergency Medicine

## 2011-07-19 ENCOUNTER — Other Ambulatory Visit: Payer: Self-pay

## 2011-07-19 ENCOUNTER — Encounter: Payer: Self-pay | Admitting: Cardiology

## 2011-07-19 ENCOUNTER — Emergency Department (HOSPITAL_COMMUNITY): Payer: BC Managed Care – PPO

## 2011-07-19 DIAGNOSIS — R079 Chest pain, unspecified: Secondary | ICD-10-CM | POA: Insufficient documentation

## 2011-07-19 DIAGNOSIS — R55 Syncope and collapse: Secondary | ICD-10-CM | POA: Insufficient documentation

## 2011-07-19 DIAGNOSIS — R42 Dizziness and giddiness: Secondary | ICD-10-CM | POA: Insufficient documentation

## 2011-07-19 HISTORY — DX: Cardiac murmur, unspecified: R01.1

## 2011-07-19 HISTORY — DX: Bradycardia, unspecified: R00.1

## 2011-07-19 LAB — BASIC METABOLIC PANEL
BUN: 11 mg/dL (ref 6–23)
CO2: 25 mEq/L (ref 19–32)
Calcium: 9.2 mg/dL (ref 8.4–10.5)
Chloride: 104 mEq/L (ref 96–112)
Creatinine, Ser: 0.61 mg/dL (ref 0.50–1.10)
GFR calc Af Amer: >90 mL/min (ref >90)
GFR calc non Af Amer: >90 mL/min (ref >90)
Glucose, Bld: 137 mg/dL — ABNORMAL HIGH (ref 70–99)
Potassium: 3.2 mEq/L — ABNORMAL LOW (ref 3.5–5.1)
Sodium: 137 mEq/L (ref 135–145)

## 2011-07-19 LAB — CBC
HCT: 37.3 % (ref 36.0–46.0)
Hemoglobin: 12.6 g/dL (ref 12.0–15.0)
MCH: 30.9 pg (ref 26.0–34.0)
MCHC: 33.8 g/dL (ref 30.0–36.0)
MCV: 91.4 fL (ref 78.0–100.0)
Platelets: 203 10*3/uL (ref 150–400)
RBC: 4.08 MIL/uL (ref 3.87–5.11)
RDW: 13 % (ref 11.5–15.5)
WBC: 5.6 10*3/uL (ref 4.0–10.5)

## 2011-07-19 LAB — URINALYSIS, ROUTINE W REFLEX MICROSCOPIC
Bilirubin Urine: NEGATIVE
Glucose, UA: NEGATIVE mg/dL
Hgb urine dipstick: NEGATIVE
Ketones, ur: NEGATIVE mg/dL
Leukocytes, UA: NEGATIVE
Nitrite: NEGATIVE
Protein, ur: NEGATIVE mg/dL
Specific Gravity, Urine: 1.009 (ref 1.005–1.030)
Urobilinogen, UA: 0.2 mg/dL (ref 0.0–1.0)
pH: 7.5 (ref 5.0–8.0)

## 2011-07-19 LAB — D-DIMER, QUANTITATIVE: D-Dimer, Quant: 0.56 ug/mL-FEU — ABNORMAL HIGH (ref 0.00–0.48)

## 2011-07-19 LAB — CARDIAC PANEL(CRET KIN+CKTOT+MB+TROPI)
CK, MB: 3.1 ng/mL (ref 0.3–4.0)
Relative Index: 2 (ref 0.0–2.5)
Total CK: 155 U/L (ref 7–177)
Troponin I: <0.3 ng/mL (ref <0.30)

## 2011-07-19 MED ORDER — ACETAMINOPHEN 500 MG PO TABS
1000.0000 mg | ORAL_TABLET | ORAL | Status: AC
  Administered 2011-07-19: 975 mg via ORAL
  Filled 2011-07-19: qty 2

## 2011-07-19 MED ORDER — ACETAMINOPHEN 325 MG PO TABS
ORAL_TABLET | ORAL | Status: AC
  Filled 2011-07-19: qty 3

## 2011-07-19 MED ORDER — IBUPROFEN 800 MG PO TABS: 800.0000 mg | ORAL_TABLET | Freq: Three times a day (TID) | ORAL | Status: AC | PRN

## 2011-07-19 MED ORDER — SODIUM CHLORIDE 0.9 % IV BOLUS (SEPSIS)
1000.0000 mL | Freq: Once | INTRAVENOUS | Status: AC
  Administered 2011-07-19: 1000 mL via INTRAVENOUS

## 2011-07-19 MED ORDER — IOHEXOL 300 MG/ML  SOLN
80.0000 mL | Freq: Once | INTRAMUSCULAR | Status: AC | PRN
  Administered 2011-07-19: 80 mL via INTRAVENOUS

## 2011-07-19 MED ORDER — POTASSIUM CHLORIDE CRYS ER 20 MEQ PO TBCR
40.0000 meq | EXTENDED_RELEASE_TABLET | Freq: Once | ORAL | Status: AC
  Administered 2011-07-19: 40 meq via ORAL
  Filled 2011-07-19: qty 2

## 2011-07-19 NOTE — ED Provider Notes (Signed)
Medical screening examination/treatment/procedure(s) were performed by non-physician practitioner and as supervising physician I was immediately available for consultation/collaboration. Devoria Albe, MD, Armando Gang   Ward Givens, MD 07/19/11 [phone]

## 2011-07-19 NOTE — ED Notes (Signed)
Pt reports that she was at work when she had a near syncopal episode, also had some chest discomfort and weakness. Pt is bradycardiac normally. Bp- 122/70 Hr- 50. CBG-144

## 2011-07-19 NOTE — ED Provider Notes (Signed)
History     CSN: [id number] Arrival date & time: 07/19/2011 10:30 AM   First MD Initiated Contact with Patient 07/19/11 1042      Chief Complaint  Patient presents with  . Near Syncope  . Chest Pain    (Consider location/radiation/quality/duration/timing/severity/associated sxs/prior treatment) HPI The patient works at [employer] and today she stated that she was working and got lightheaded and dizzy feeling.  Patient had some chest discomfort and remained constant along with some weakness.  Patient states that she is seen by Dr. Peter Swaziland for bradycardia that does not cause her symptoms.  Patient states that she is not short of breath, no abdominal pain, nausea, vomiting, diarrhea, headache, fever or numbness.  She states that she has been seen for this similar symptoms in the past.  Patient states that she did not try any treatments to help alleviate her condition.        Past Medical History  Diagnosis Date  . Bradycardia   . Heart murmur     History reviewed. No pertinent past surgical history.  History reviewed. No pertinent family history.  History  Substance Use Topics  . Smoking status: Not on file  . Smokeless tobacco: Not on file  . Alcohol Use:     OB History    Grav Para Term Preterm Abortions TAB SAB Ect Mult Living                  Review of Systems All pertinent positives and negatives in the history of present illness  Allergies  Review of patient's allergies indicates no known allergies.  Home Medications   Current Outpatient Rx  Name Route Sig Dispense Refill  . ACETAMINOPHEN 325 MG PO TABS Oral Take 325-650 mg by mouth every 6 (six) hours as needed. Headache/pain       BP 120/78  Pulse 45  Temp(Src) 98.4 F (36.9 C) (Oral)  Resp 17  SpO2 100%  Physical Exam  Constitutional: She is oriented to person, place, and time. She appears well-developed and well-nourished. No distress.  HENT:  Head: Normocephalic and atraumatic.  Eyes:  Pupils are equal, round, and reactive to light.  Neck: Normal range of motion. Neck supple.  Cardiovascular: Normal rate, regular rhythm and normal heart sounds.   Pulmonary/Chest: Effort normal and breath sounds normal. No respiratory distress. She has no wheezes. She has no rales.  Abdominal: Soft. Bowel sounds are normal. She exhibits no distension. There is no tenderness. There is no rebound and no guarding.  Neurological: She is alert and oriented to person, place, and time. Coordination normal.  Skin: Skin is warm and dry. No rash noted.    ED Course  Procedures (including critical care time)  Labs Reviewed  BASIC METABOLIC PANEL - Abnormal; Notable for the following:    Potassium 3.2 (*)    Glucose, Bld 137 (*)    All other components within normal limits  D-DIMER, QUANTITATIVE - Abnormal; Notable for the following:    D-Dimer, Quant 0.56 (*)    All other components within normal limits  CARDIAC PANEL(CRET KIN+CKTOT+MB+TROPI)  CBC  URINALYSIS, ROUTINE W REFLEX MICROSCOPIC     In review of her previous charting from her doctor and here in the emergency department as well as admissions to the hospital patient appears to have dizziness and lightheadedness at times and it has been also reported while at work in the past.  The patient is low risk for PE based on Wells criteria is perc  negative but based on her complaints And a positive d-dimer get a CT scan of her chest to rule out PE or other etiologies.  This discomfort has been constant in her left chest.  She is feeling better.  Patient's chest pain has been constant thus seems unlikely to be cardiac in nature.     MDM    ` Date: 07/19/2011  Rate: 55   Rhythm: normal sinus rhythm  QRS Axis: normal  Intervals: normal  ST/T Wave abnormalities: nonspecific T wave changes  Conduction Disutrbances:none  Narrative Interpretation:   Old EKG Reviewed: unchanged        Carlyle Dolly, PA-C 07/19/11  1447  Carlyle Dolly, PA-C 07/19/11 1523

## 2011-07-19 NOTE — ED Notes (Signed)
IV team paged for second IV for testing.

## 2011-08-17 ENCOUNTER — Encounter (HOSPITAL_COMMUNITY): Payer: Self-pay

## 2011-08-17 ENCOUNTER — Emergency Department (HOSPITAL_COMMUNITY)
Admission: EM | Admit: 2011-08-17 | Discharge: 2011-08-17 | Disposition: A | Discharge status: Home / Self Care | Payer: BC Managed Care – PPO | Attending: Emergency Medicine | Admitting: Emergency Medicine

## 2011-08-17 DIAGNOSIS — R10816 Epigastric abdominal tenderness: Secondary | ICD-10-CM | POA: Insufficient documentation

## 2011-08-17 DIAGNOSIS — R110 Nausea: Secondary | ICD-10-CM | POA: Insufficient documentation

## 2011-08-17 DIAGNOSIS — R1013 Epigastric pain: Secondary | ICD-10-CM | POA: Insufficient documentation

## 2011-08-17 LAB — CBC
HCT: 38.4 % (ref 36.0–46.0)
Hemoglobin: 12.7 g/dL (ref 12.0–15.0)
MCH: 30.2 pg (ref 26.0–34.0)
MCHC: 33.1 g/dL (ref 30.0–36.0)
MCV: 91.4 fL (ref 78.0–100.0)
Platelets: 211 10*3/uL (ref 150–400)
RBC: 4.2 MIL/uL (ref 3.87–5.11)
RDW: 13.1 % (ref 11.5–15.5)
WBC: 7.1 10*3/uL (ref 4.0–10.5)

## 2011-08-17 LAB — COMPREHENSIVE METABOLIC PANEL
ALT: 14 U/L (ref 0–35)
AST: 20 U/L (ref 0–37)
Albumin: 3.8 g/dL (ref 3.5–5.2)
Alkaline Phosphatase: 72 U/L (ref 39–117)
BUN: 10 mg/dL (ref 6–23)
CO2: 26 mEq/L (ref 19–32)
Calcium: 9.4 mg/dL (ref 8.4–10.5)
Chloride: 103 mEq/L (ref 96–112)
Creatinine, Ser: 0.55 mg/dL (ref 0.50–1.10)
GFR calc Af Amer: >90 mL/min (ref >90)
GFR calc non Af Amer: >90 mL/min (ref >90)
Glucose, Bld: 104 mg/dL — ABNORMAL HIGH (ref 70–99)
Potassium: 4.6 mEq/L (ref 3.5–5.1)
Sodium: 136 mEq/L (ref 135–145)
Total Bilirubin: 0.4 mg/dL (ref 0.3–1.2)
Total Protein: 7.9 g/dL (ref 6.0–8.3)

## 2011-08-17 LAB — URINALYSIS, ROUTINE W REFLEX MICROSCOPIC
Bilirubin Urine: NEGATIVE
Glucose, UA: NEGATIVE mg/dL
Ketones, ur: NEGATIVE mg/dL
Nitrite: NEGATIVE
Protein, ur: NEGATIVE mg/dL
Specific Gravity, Urine: 1.013 (ref 1.005–1.030)
Urobilinogen, UA: 0.2 mg/dL (ref 0.0–1.0)
pH: 6.5 (ref 5.0–8.0)

## 2011-08-17 LAB — URINE MICROSCOPIC-ADD ON

## 2011-08-17 LAB — DIFFERENTIAL
Basophils Absolute: 0 10*3/uL (ref 0.0–0.1)
Basophils Relative: 0 % (ref 0–1)
Eosinophils Absolute: 0.1 10*3/uL (ref 0.0–0.7)
Eosinophils Relative: 1 % (ref 0–5)
Lymphocytes Relative: 32 % (ref 12–46)
Lymphs Abs: 2.2 10*3/uL (ref 0.7–4.0)
Monocytes Absolute: 0.4 10*3/uL (ref 0.1–1.0)
Monocytes Relative: 5 % (ref 3–12)
Neutro Abs: 4.4 10*3/uL (ref 1.7–7.7)
Neutrophils Relative %: 62 % (ref 43–77)

## 2011-08-17 MED ORDER — ONDANSETRON 4 MG PO TBDP
4.0000 mg | ORAL_TABLET | Freq: Once | ORAL | Status: AC
  Administered 2011-08-17: 4 mg via ORAL
  Filled 2011-08-17: qty 1

## 2011-08-17 MED ORDER — METOCLOPRAMIDE HCL 10 MG PO TABS: 10.0000 mg | ORAL_TABLET | Freq: Four times a day (QID) | ORAL | Status: AC

## 2011-08-17 MED ORDER — ONDANSETRON 4 MG PO TBDP: 4.0000 mg | ORAL_TABLET | Freq: Three times a day (TID) | ORAL | Status: AC | PRN

## 2011-08-17 MED ORDER — METOCLOPRAMIDE HCL 10 MG PO TABS
10.0000 mg | ORAL_TABLET | Freq: Once | ORAL | Status: AC
  Administered 2011-08-17: 10 mg via ORAL
  Filled 2011-08-17: qty 1

## 2011-08-17 NOTE — ED Provider Notes (Signed)
History     CSN: [id number]  Arrival date & time 08/17/11  1809   First MD Initiated Contact with Patient 08/17/11 2016      No chief complaint on file.   (Consider location/radiation/quality/duration/timing/severity/associated sxs/prior treatment) HPI Comments: Patient states that for years.  She has the feeling that her food is not passing through adequately.  She has seen numerous physicians for this and has had numerous tests without any results.  She has as appointment with Dr. Elnoria Howard in 2 days, but today is having increased nausea and pain with food consumption.  She has lost no weight in the last year.  She has not taken any over-the-counter medication, although this does help.she states she wants to know why she is having pain.   The history is provided by the patient.    Past Medical History  Diagnosis Date  . Bradycardia   . Heart murmur     History reviewed. No pertinent past surgical history.  History reviewed. No pertinent family history.  History  Substance Use Topics  . Smoking status: Never Smoker   . Smokeless tobacco: Not on file  . Alcohol Use: No    OB History    Grav Para Term Preterm Abortions TAB SAB Ect Mult Living                  Review of Systems  Constitutional: Positive for appetite change. Negative for fever, chills and activity change.  Respiratory: Negative for cough, chest tightness and shortness of breath.   Cardiovascular: Negative for chest pain.  Gastrointestinal: Positive for nausea and abdominal pain. Negative for vomiting.  Musculoskeletal: Negative.   Skin: Negative.   Neurological: Negative for dizziness and weakness.  Psychiatric/Behavioral: Negative.     Allergies  Review of patient's allergies indicates no known allergies.  Home Medications   Current Outpatient Rx  Name Route Sig Dispense Refill  . ACETAMINOPHEN 325 MG PO TABS Oral Take 325 mg by mouth every 6 (six) hours as needed. Headache/pain    . ESOMEPRAZOLE  MAGNESIUM 20 MG PO CPDR Oral Take 20 mg by mouth daily before breakfast.        BP 145/68  Pulse 49  Temp(Src) 98 F (36.7 C) (Oral)  Resp 18  Ht 5\' 4"  (1.626 m)  Wt 162 lb (73.483 kg)  BMI 27.81 kg/m2  SpO2 100%  Physical Exam  Constitutional: She is oriented to person, place, and time. She appears well-developed and well-nourished.  HENT:  Head: Normocephalic.  Eyes: Pupils are equal, round, and reactive to light.  Neck: Normal range of motion.  Cardiovascular: Normal rate.   Pulmonary/Chest: Effort normal and breath sounds normal.  Abdominal: Soft. Bowel sounds are normal. She exhibits no distension. There is tenderness in the epigastric area. There is negative Murphy's sign.  Musculoskeletal: Normal range of motion.  Neurological: She is alert and oriented to person, place, and time.  Skin: Skin is warm and dry.  Psychiatric: She has a normal mood and affect.    ED Course  Procedures (including critical care time)   Labs Reviewed  URINALYSIS, ROUTINE W REFLEX MICROSCOPIC  CBC  DIFFERENTIAL  COMPREHENSIVE METABOLIC PANEL   No results found.   No diagnosis found.  Labs reviewed and are normal.  Discussed this with patient.  She did see a great deal of relief from her nausea and her discomfort after the Zofran and Reglan.  Will discharge her home with prescriptions for both and allow her to follow  up with Dr. Elnoria Howard is scheduled in 2 days  MDM  Discussed at length.  The limitations of the emergency department in diagnosing chronic problem.  We will check CBC [facility] acute abdomen series provide antiemetic and pain control.  If these are all normal.  Will allow patient to followup with Dr. Elnoria Howard as scheduled in 2 days        Arman Filter, NP 08/17/11 2037  Arman Filter, NP 08/17/11 4098  Arman Filter, NP 08/17/11 2147

## 2011-08-17 NOTE — Discharge Instructions (Signed)
As discussed follow up with Dr. Elnoria Howard is, paramount.  You've been given prescriptions for Zofran and Reglan to help your symptoms

## 2011-08-17 NOTE — ED Notes (Signed)
Pt c/o intermittent upper abd pain and vomiting "for months." Per spouse they have been to the dr several times for the same w/o any answers. Denies eating any fatty food, last meal this am, last bm this am, vomited x2 today. Reports nothing relieves the pain, eating makes the pain worse, pt describes the pain as a sharp pain 10/10

## 2011-08-19 NOTE — ED Provider Notes (Signed)
Medical screening examination/treatment/procedure(s) were performed by non-physician practitioner and as supervising physician I was immediately available for consultation/collaboration.  Flint Melter, MD 08/19/11 1050

## 2011-09-15 ENCOUNTER — Other Ambulatory Visit: Payer: Self-pay | Admitting: Family Medicine

## 2011-09-15 DIAGNOSIS — Z1231 Encounter for screening mammogram for malignant neoplasm of breast: Secondary | ICD-10-CM

## 2011-10-12 ENCOUNTER — Ambulatory Visit
Admission: RE | Admit: 2011-10-12 | Discharge: 2011-10-12 | Disposition: A | Discharge status: Home / Self Care | Payer: BC Managed Care – PPO | Source: Ambulatory Visit | Attending: Family Medicine | Admitting: Family Medicine

## 2011-10-12 DIAGNOSIS — Z1231 Encounter for screening mammogram for malignant neoplasm of breast: Secondary | ICD-10-CM

## 2011-11-12 ENCOUNTER — Encounter: Payer: Self-pay | Admitting: *Deleted

## 2012-01-19 ENCOUNTER — Encounter: Payer: Self-pay | Admitting: Cardiology

## 2012-01-26 ENCOUNTER — Ambulatory Visit (INDEPENDENT_AMBULATORY_CARE_PROVIDER_SITE_OTHER): Payer: BC Managed Care – PPO | Admitting: Internal Medicine

## 2012-01-26 ENCOUNTER — Encounter: Payer: Self-pay | Admitting: Internal Medicine

## 2012-01-26 VITALS — BP 109/69 | HR 50 | Temp 98.0°F | Resp 16 | Ht 63.5 in | Wt 182.0 lb

## 2012-01-26 DIAGNOSIS — L03119 Cellulitis of unspecified part of limb: Secondary | ICD-10-CM

## 2012-01-26 DIAGNOSIS — L02619 Cutaneous abscess of unspecified foot: Secondary | ICD-10-CM

## 2012-01-26 DIAGNOSIS — R059 Cough, unspecified: Secondary | ICD-10-CM

## 2012-01-26 DIAGNOSIS — R05 Cough: Secondary | ICD-10-CM

## 2012-01-26 MED ORDER — HYDROCODONE-HOMATROPINE 5-1.5 MG/5ML PO SYRP: 5.0000 mL | ORAL_SOLUTION | Freq: Three times a day (TID) | ORAL | Status: AC | PRN

## 2012-01-26 MED ORDER — DOXYCYCLINE HYCLATE 100 MG PO TABS: 100.0000 mg | ORAL_TABLET | Freq: Two times a day (BID) | ORAL | Status: AC

## 2012-01-26 NOTE — Progress Notes (Signed)
  Subjective:    Patient ID: Erin Craig, female    DOB: 1961/04/25, 51 y.o.   MRN: [id number]  HPI Just back from trip to [place]//swollen and painful around the first MTP/had bunion surgery there 3 weeks ago and now the area is very swollen and tender/no fever or other systemic symptoms  While in [place] caught a bad cold and is still coughing and can't sleep/cough nonproductive/no fever   Review of Systems     Objective:   Physical Exam Vital signs stable Lungs clear to auscultation Right foot with cellulitis around the first MTP where there is a surgical wound Tender-Swollen/chronic scaling also Slight pus expressed and cultured First MTP motion intact without significant tenderness to suggest bony infection       Assessment & Plan:  Problem #1 Cellulitis foot status post surgery Culture Doxycycline 100 twice a day #20 Followup the [facility] in 3-5 days  Problem #2  coughHycodan

## 2012-01-28 ENCOUNTER — Telehealth: Payer: Self-pay

## 2012-01-28 NOTE — Telephone Encounter (Signed)
PT WOULD LIKE TO KNOW HER LAB RESULTS. PLEASE CALL IF THEY ARE READY.

## 2012-01-29 LAB — WOUND CULTURE
Gram Stain: NONE SEEN
Gram Stain: NONE SEEN

## 2012-01-29 NOTE — Telephone Encounter (Signed)
It looks like lab results are ready, but Dr Merla Riches has not reviewed them yet. Can someone review and comment so that we may call the Erin Craig?

## 2012-01-30 NOTE — Telephone Encounter (Signed)
Called pt, advised labs. Pt understood

## 2012-01-30 NOTE — Telephone Encounter (Signed)
The culture shows Staph but not MRSA at this time and she is on the correct abx

## 2012-02-03 ENCOUNTER — Encounter: Payer: Self-pay | Admitting: Internal Medicine

## 2012-02-03 ENCOUNTER — Telehealth: Payer: Self-pay

## 2012-02-03 NOTE — Telephone Encounter (Signed)
Brandy from [facility] is needing pt lab results on pt left foot she is in office now, Gearldine Bienenstock is faxing med release form to our office with pt signature please fax info back to [phone].Marland Kitchen __PT IN OFFICE NOW_ thanks:)

## 2012-02-03 NOTE — Telephone Encounter (Signed)
Office notes including labs sent thru [facility]

## 2012-11-10 DIAGNOSIS — Z800 Family history of malignant neoplasm of digestive organs: Secondary | ICD-10-CM | POA: Insufficient documentation

## 2012-11-10 DIAGNOSIS — K529 Noninfective gastroenteritis and colitis, unspecified: Secondary | ICD-10-CM | POA: Insufficient documentation

## 2012-11-10 DIAGNOSIS — Z1211 Encounter for screening for malignant neoplasm of colon: Secondary | ICD-10-CM | POA: Insufficient documentation

## 2014-06-27 ENCOUNTER — Encounter: Payer: Self-pay | Admitting: Cardiology

## 2016-02-17 ENCOUNTER — Emergency Department (HOSPITAL_COMMUNITY): Payer: PRIVATE HEALTH INSURANCE

## 2016-02-17 ENCOUNTER — Encounter (HOSPITAL_COMMUNITY): Payer: Self-pay | Admitting: *Deleted

## 2016-02-17 ENCOUNTER — Emergency Department (HOSPITAL_COMMUNITY)
Admission: EM | Admit: 2016-02-17 | Discharge: 2016-02-17 | Disposition: A | Discharge status: Home / Self Care | Payer: PRIVATE HEALTH INSURANCE | Attending: Emergency Medicine | Admitting: Emergency Medicine

## 2016-02-17 DIAGNOSIS — E119 Type 2 diabetes mellitus without complications: Secondary | ICD-10-CM | POA: Diagnosis not present

## 2016-02-17 DIAGNOSIS — R0789 Other chest pain: Secondary | ICD-10-CM | POA: Insufficient documentation

## 2016-02-17 DIAGNOSIS — R079 Chest pain, unspecified: Secondary | ICD-10-CM | POA: Diagnosis present

## 2016-02-17 HISTORY — DX: Type 2 diabetes mellitus without complications: E11.9

## 2016-02-17 LAB — BASIC METABOLIC PANEL
Anion gap: 7 (ref 5–15)
BUN: 14 mg/dL (ref 6–20)
CO2: 23 mmol/L (ref 22–32)
Calcium: 9 mg/dL (ref 8.9–10.3)
Chloride: 105 mmol/L (ref 101–111)
Creatinine, Ser: 0.71 mg/dL (ref 0.44–1.00)
GFR calc Af Amer: >60 mL/min (ref >60)
GFR calc non Af Amer: >60 mL/min (ref >60)
Glucose, Bld: 161 mg/dL — ABNORMAL HIGH (ref 65–99)
Potassium: 3.6 mmol/L (ref 3.5–5.1)
Sodium: 135 mmol/L (ref 135–145)

## 2016-02-17 LAB — CBC
HCT: 38.9 % (ref 36.0–46.0)
Hemoglobin: 13.2 g/dL (ref 12.0–15.0)
MCH: 31.4 pg (ref 26.0–34.0)
MCHC: 33.9 g/dL (ref 30.0–36.0)
MCV: 92.4 fL (ref 78.0–100.0)
Platelets: 219 10*3/uL (ref 150–400)
RBC: 4.21 MIL/uL (ref 3.87–5.11)
RDW: 13.5 % (ref 11.5–15.5)
WBC: 6.7 10*3/uL (ref 4.0–10.5)

## 2016-02-17 LAB — I-STAT TROPONIN, ED: Troponin i, poc: 0 ng/mL (ref 0.00–0.08)

## 2016-02-17 MED ORDER — NAPROXEN 500 MG PO TABS: 500.0000 mg | ORAL_TABLET | Freq: Two times a day (BID) | ORAL | Status: DC

## 2016-02-17 MED ORDER — KETOROLAC TROMETHAMINE 30 MG/ML IJ SOLN
30.0000 mg | Freq: Once | INTRAMUSCULAR | Status: AC
  Administered 2016-02-17: 30 mg via INTRAVENOUS
  Filled 2016-02-17: qty 1

## 2016-02-17 MED ORDER — TRAMADOL HCL 50 MG PO TABS: 50.0000 mg | ORAL_TABLET | Freq: Four times a day (QID) | ORAL | Status: DC | PRN

## 2016-02-17 MED ORDER — ASPIRIN 81 MG PO CHEW
324.0000 mg | CHEWABLE_TABLET | Freq: Once | ORAL | Status: AC
  Administered 2016-02-17: 324 mg via ORAL
  Filled 2016-02-17: qty 4

## 2016-02-17 MED ORDER — IOPAMIDOL (ISOVUE-370) INJECTION 76%
100.0000 mL | Freq: Once | INTRAVENOUS | Status: AC | PRN
  Administered 2016-02-17: 100 mL via INTRAVENOUS

## 2016-02-17 MED ORDER — MORPHINE SULFATE (PF) 4 MG/ML IV SOLN
4.0000 mg | Freq: Once | INTRAVENOUS | Status: AC
  Administered 2016-02-17: 4 mg via INTRAVENOUS
  Filled 2016-02-17: qty 1

## 2016-02-17 NOTE — ED Provider Notes (Signed)
CSN: [id number]     Arrival date & time 02/17/16  0449 History   First MD Initiated Contact with Patient 02/17/16 [phone]     Chief Complaint  Patient presents with  . Chest Pain     (Consider location/radiation/quality/duration/timing/severity/associated sxs/prior Treatment) Patient is a 55 y.o. female presenting with chest pain. The history is provided by the patient.  Chest Pain She complains of waking up with tingling and achy pain in the left side of her chest. She is somewhat inconsistent historian. She denied.nausea to me but told triage nurse that she was nauseous. She rated the pain at 10/10. Nothing made it better nothing made it worse. She says that she didn't take anything for it because she took a dose of metformin last night. She denies dyspnea and diaphoresis. She thinks she had a similar episode of pain several years ago but cannot remember what was diagnosed. She does have history of diabetes but does not have any history of hypertension or hyperlipidemia. No history of heart problems. Of note, she did drive up here from [place] last month. She has noted that her left leg is more swollen than the right.  Past Medical History  Diagnosis Date  . Bradycardia   . Heart murmur   . Diabetes mellitus without complication [facility])    Past Surgical History  Procedure Laterality Date  . Cholecystectomy    . Cesarean section    . Foot surgery    . Abdominal hysterectomy     Family History  Problem Relation Age of Onset  . Alzheimer's disease    . Coronary artery disease    . Breast cancer    . Arthritis    . Hypertension    . Diabetes     Social History  Substance Use Topics  . Smoking status: Never Smoker   . Smokeless tobacco: None  . Alcohol Use: No   OB History    No data available     Review of Systems  Cardiovascular: Positive for chest pain.  All other systems reviewed and are negative.     Allergies  Review of patient's allergies indicates no known  allergies.  Home Medications   Prior to Admission medications   Medication Sig Start Date End Date Taking? Authorizing Provider  acetaminophen (TYLENOL) 325 MG tablet Take 325 mg by mouth every 6 (six) hours as needed. Headache/pain    Historical Provider, MD  esomeprazole (NEXIUM) 20 MG capsule Take 20 mg by mouth daily before breakfast.      Historical Provider, MD   BP 129/77 mmHg  Pulse 69  Temp(Src) 98.4 F (36.9 C) (Oral)  Resp 14  Ht  (1.626 m)  Wt 175 lb (79.379 kg)  BMI 30.02 kg/m2  SpO2 100% Physical Exam  Nursing note and vitals reviewed.  55 year old female, resting comfortably and in no acute distress. Vital signs are normal. Oxygen saturation is 100%, which is normal. Head is normocephalic and atraumatic. PERRLA, EOMI. Oropharynx is clear. Neck is nontender and supple without adenopathy or JVD. Back is nontender and there is no CVA tenderness. Lungs are clear without rales, wheezes, or rhonchi. Chest is moderately tender in the left parasternal area. Heart has regular rate and rhythm without murmur. Abdomen is soft, flat, nontender without masses or hepatosplenomegaly and peristalsis is normoactive. Extremities have trace edema, full range of motion is present. Left calf circumferences 1 cm greater than right calf circumference. There is no erythema or warmth. There is no  palpable cord. Homans sign is negative. Skin is warm and dry without rash. Neurologic: Mental status is normal, cranial nerves are intact, there are no motor or sensory deficits.  ED Course  Procedures (including critical care time) Labs Review Results for orders placed or performed during the hospital encounter of 02/17/16  Basic metabolic panel  Result Value Ref Range   Sodium 135 135 - 145 mmol/L   Potassium 3.6 3.5 - 5.1 mmol/L   Chloride 105 101 - 111 mmol/L   CO2 23 22 - 32 mmol/L   Glucose, Bld 161 (H) 65 - 99 mg/dL   BUN 14 6 - 20 mg/dL   Creatinine, Ser 3.240.71 0.44 - 1.00 mg/dL    Calcium 9.0 8.9 - 40.110.3 mg/dL   GFR calc non Af Amer >60 >60 mL/min   GFR calc Af Amer >60 >60 mL/min   Anion gap 7 5 - 15  CBC  Result Value Ref Range   WBC 6.7 4.0 - 10.5 K/uL   RBC 4.21 3.87 - 5.11 MIL/uL   Hemoglobin 13.2 12.0 - 15.0 g/dL   HCT 02.738.9 25.336.0 - 66.446.0 %   MCV 92.4 78.0 - 100.0 fL   MCH 31.4 26.0 - 34.0 pg   MCHC 33.9 30.0 - 36.0 g/dL   RDW 40.313.5 47.411.5 - 25.915.5 %   Platelets 219 150 - 400 K/uL  I-stat troponin, ED  Result Value Ref Range   Troponin i, poc 0.00 0.00 - 0.08 ng/mL   Comment 3           Imaging Review Dg Chest 2 View  02/17/2016  CLINICAL DATA:  LEFT chest pain radiating to LEFT arm. History of heart murmur, diabetes. EXAM: CHEST  2 VIEW COMPARISON:  Chest radiograph April 08, 2011 FINDINGS: Cardiomediastinal silhouette is normal. The lungs are clear without pleural effusions or focal consolidations. Bibasilar strandy densities. Trachea projects midline and there is no pneumothorax. Soft tissue planes and included osseous structures are non-suspicious. Surgical clips RIGHT upper quadrant. IMPRESSION: Bibasilar atelectasis. Electronically Signed   By: Awilda Metroourtnay  Bloomer M.D.   On: 02/17/2016 05:17   Ct Angio Chest Pe W/cm &/or Wo Cm  02/17/2016  CLINICAL DATA:  Awoke with left-sided chest pain and tingling in the left chest and left leg. Personal history of diabetes and heart murmur. EXAM: CT ANGIOGRAPHY CHEST WITH CONTRAST TECHNIQUE: Multidetector CT imaging of the chest was performed using the standard protocol during bolus administration of intravenous contrast. Multiplanar CT image reconstructions and MIPs were obtained to evaluate the vascular anatomy. CONTRAST:  100 cc Isovue 370 COMPARISON:  07/19/2011 FINDINGS: Pulmonary arterial opacification is excellent.  No pulmonary emboli. There is aortic atherosclerosis. There is mild wasting of the aorta at the ductus level with minimal dilatation at the arch just pass that. Maximal diameter measures 3.3 cm. No evidence  of dissection. No coronary artery calcification is seen. No pleural or pericardial fluid. The lung parenchyma is clear. No enlarged hilar or mediastinal lymph nodes. Ordinary mild degenerative change affects the spine. Scans in the upper abdomen are unremarkable. Review of the MIP images confirms the above findings. IMPRESSION: No pulmonary emboli.  No active chest disease. Aortic atherosclerosis, mild. Mild dilatation of the arch with maximal diameter of 3.3 cm. Electronically Signed   By: Paulina FusiMark  Shogry M.D.   On: 02/17/2016 07:20   I have personally reviewed and evaluated these images and lab results as part of my medical decision-making.   EKG Interpretation   Date/Time:  Monday  February 17 2016 04:57:22 EDT Ventricular Rate:  61 PR Interval:    QRS Duration: 93 QT Interval:  413 QTC Calculation: 416 R Axis:   77 Text Interpretation:  Sinus rhythm Borderline T wave abnormalities  Baseline wander in lead(s) V2 When compared with ECG of 07/19/2011, No  significant change was found Confirmed by [facility]  MD, Christyn Gutkowski ([id number]) on  02/17/2016 5:01:24 AM      MDM   Final diagnoses:  Chest wall pain    Chest pain of uncertain cause. She clearly has chest wall tenderness but she is also at risk for pulmonary embolus and given her long distance car ride. ECG shows no acute changes and troponin is normal. She will be sent for CT angiogram. She's given a dose of aspirin. Old records are reviewed and she does have a prior ED evaluation for chest pain in 2012.  ED workup is negative - including negative CT angiogram. She is discharged with a prescription for naproxen and tramadol.  Dione Booze, MD 02/17/16 [phone]

## 2016-02-17 NOTE — Discharge Instructions (Signed)
Chest Wall Pain Chest wall pain is pain in or around the bones and muscles of your chest. Sometimes, an injury causes this pain. Sometimes, the cause may not be known. This pain may take several weeks or longer to get better. HOME CARE INSTRUCTIONS  Pay attention to any changes in your symptoms. Take these actions to help with your pain:   Rest as told by your health care provider.   Avoid activities that cause pain. These include any activities that use your chest muscles or your abdominal and side muscles to lift heavy items.   If directed, apply ice to the painful area:  Put ice in a plastic bag.  Place a towel between your skin and the bag.  Leave the ice on for 20 minutes, 2-3 times per day.  Take over-the-counter and prescription medicines only as told by your health care provider.  Do not use tobacco products, including cigarettes, chewing tobacco, and e-cigarettes. If you need help quitting, ask your health care provider.  Keep all follow-up visits as told by your health care provider. This is important. SEEK MEDICAL CARE IF:  You have a fever.  Your chest pain becomes worse.  You have new symptoms. SEEK IMMEDIATE MEDICAL CARE IF:  You have nausea or vomiting.  You feel sweaty or light-headed.  You have a cough with phlegm (sputum) or you cough up blood.  You develop shortness of breath.   This information is not intended to replace advice given to you by your health care provider. Make sure you discuss any questions you have with your health care provider.   Document Released: 07/27/2005 Document Revised: 04/17/2015 Document Reviewed: 10/22/2014 Elsevier Interactive Patient Education 2016 [employer]  Naproxen and naproxen sodium oral immediate-release tablets What is this medicine? NAPROXEN (na PROX en) is a non-steroidal anti-inflammatory drug (NSAID). It is used to reduce swelling and to treat pain. This medicine may be used for dental pain, headache,  or painful monthly periods. It is also used for painful joint and muscular problems such as arthritis, tendinitis, bursitis, and gout. This medicine may be used for other purposes; ask your health care provider or pharmacist if you have questions. What should I tell my health care provider before I take this medicine? They need to know if you have any of these conditions: -asthma -cigarette smoker -drink more than 3 alcohol containing drinks a day -heart disease or circulation problems such as heart failure or leg edema (fluid retention) -high blood pressure -kidney disease -liver disease -stomach bleeding or ulcers -an unusual or allergic reaction to naproxen, aspirin, other NSAIDs, other medicines, foods, dyes, or preservatives -pregnant or trying to get pregnant -breast-feeding How should I use this medicine? Take this medicine by mouth with a glass of water. Follow the directions on the prescription label. Take it with food if your stomach gets upset. Try to not lie down for at least 10 minutes after you take it. Take your medicine at regular intervals. Do not take your medicine more often than directed. Long-term, continuous use may increase the risk of heart attack or stroke. A special MedGuide will be given to you by the pharmacist with each prescription and refill. Be sure to read this information carefully each time. Talk to your pediatrician regarding the use of this medicine in children. Special care may be needed. Overdosage: If you think you have taken too much of this medicine contact a poison control center or emergency room at once. NOTE: This medicine is  only for you. Do not share this medicine with others. What if I miss a dose? If you miss a dose, take it as soon as you can. If it is almost time for your next dose, take only that dose. Do not take double or extra doses. What may interact with this  medicine? -alcohol -aspirin -cidofovir -diuretics -lithium -methotrexate -other drugs for inflammation like ketorolac or prednisone -pemetrexed -probenecid -warfarin This list may not describe all possible interactions. Give your health care provider a list of all the medicines, herbs, non-prescription drugs, or dietary supplements you use. Also tell them if you smoke, drink alcohol, or use illegal drugs. Some items may interact with your medicine. What should I watch for while using this medicine? Tell your doctor or health care professional if your pain does not get better. Talk to your doctor before taking another medicine for pain. Do not treat yourself. This medicine does not prevent heart attack or stroke. In fact, this medicine may increase the chance of a heart attack or stroke. The chance may increase with longer use of this medicine and in people who have heart disease. If you take aspirin to prevent heart attack or stroke, talk with your doctor or health care professional. Do not take other medicines that contain aspirin, ibuprofen, or naproxen with this medicine. Side effects such as stomach upset, nausea, or ulcers may be more likely to occur. Many medicines available without a prescription should not be taken with this medicine. This medicine can cause ulcers and bleeding in the stomach and intestines at any time during treatment. Do not smoke cigarettes or drink alcohol. These increase irritation to your stomach and can make it more susceptible to damage from this medicine. Ulcers and bleeding can happen without warning symptoms and can cause death. You may get drowsy or dizzy. Do not drive, use machinery, or do anything that needs mental alertness until you know how this medicine affects you. Do not stand or sit up quickly, especially if you are an older patient. This reduces the risk of dizzy or fainting spells. This medicine can cause you to bleed more easily. Try to avoid damage  to your teeth and gums when you brush or floss your teeth. What side effects may I notice from receiving this medicine? Side effects that you should report to your doctor or health care professional as soon as possible: -black or bloody stools, blood in the urine or vomit -blurred vision -chest pain -difficulty breathing or wheezing -nausea or vomiting -severe stomach pain -skin rash, skin redness, blistering or peeling skin, hives, or itching -slurred speech or weakness on one side of the body -swelling of eyelids, throat, lips -unexplained weight gain or swelling -unusually weak or tired -yellowing of eyes or skin Side effects that usually do not require medical attention (report to your doctor or health care professional if they continue or are bothersome): -constipation -headache -heartburn This list may not describe all possible side effects. Call your doctor for medical advice about side effects. You may report side effects to FDA at 1-800-FDA-1088. Where should I keep my medicine? Keep out of the reach of children. Store at room temperature between 15 and 30 degrees C (59 and 86 degrees F). Keep container tightly closed. Throw away any unused medicine after the expiration date. NOTE: This sheet is a summary. It may not cover all possible information. If you have questions about this medicine, talk to your doctor, pharmacist, or health care provider.  2016, Elsevier/Gold Standard. (2009-07-29 20:10:16)  Tramadol tablets What is this medicine? TRAMADOL (TRA ma dole) is a pain reliever. It is used to treat moderate to severe pain in adults. This medicine may be used for other purposes; ask your health care provider or pharmacist if you have questions. What should I tell my health care provider before I take this medicine? They need to know if you have any of these conditions: -brain tumor -depression -drug abuse or addiction -head injury -if you frequently drink alcohol  containing drinks -kidney disease or trouble passing urine -liver disease -lung disease, asthma, or breathing problems -seizures or epilepsy -suicidal thoughts, plans, or attempt; a previous suicide attempt by you or a family member -an unusual or allergic reaction to tramadol, codeine, other medicines, foods, dyes, or preservatives -pregnant or trying to get pregnant -breast-feeding How should I use this medicine? Take this medicine by mouth with a full glass of water. Follow the directions on the prescription label. If the medicine upsets your stomach, take it with food or milk. Do not take more medicine than you are told to take. Talk to your pediatrician regarding the use of this medicine in children. Special care may be needed. Overdosage: If you think you have taken too much of this medicine contact a poison control center or emergency room at once. NOTE: This medicine is only for you. Do not share this medicine with others. What if I miss a dose? If you miss a dose, take it as soon as you can. If it is almost time for your next dose, take only that dose. Do not take double or extra doses. What may interact with this medicine? Do not take this medicine with any of the following medications: -MAOIs like Carbex, Eldepryl, Marplan, Nardil, and Parnate This medicine may also interact with the following medications: -alcohol or medicines that contain alcohol -antihistamines -benzodiazepines -bupropion -carbamazepine or oxcarbazepine -clozapine -cyclobenzaprine -digoxin -furazolidone -linezolid -medicines for depression, anxiety, or psychotic disturbances -medicines for migraine headache like almotriptan, eletriptan, frovatriptan, naratriptan, rizatriptan, sumatriptan, zolmitriptan -medicines for pain like pentazocine, buprenorphine, butorphanol, meperidine, nalbuphine, and propoxyphene -medicines for sleep -muscle relaxants -naltrexone -phenobarbital -phenothiazines like  perphenazine, thioridazine, chlorpromazine, mesoridazine, fluphenazine, prochlorperazine, promazine, and trifluoperazine -procarbazine -warfarin This list may not describe all possible interactions. Give your health care provider a list of all the medicines, herbs, non-prescription drugs, or dietary supplements you use. Also tell them if you smoke, drink alcohol, or use illegal drugs. Some items may interact with your medicine. What should I watch for while using this medicine? Tell your doctor or health care professional if your pain does not go away, if it gets worse, or if you have new or a different type of pain. You may develop tolerance to the medicine. Tolerance means that you will need a higher dose of the medicine for pain relief. Tolerance is normal and is expected if you take this medicine for a long time. Do not suddenly stop taking your medicine because you may develop a severe reaction. Your body becomes used to the medicine. This does NOT mean you are addicted. Addiction is a behavior related to getting and using a drug for a non-medical reason. If you have pain, you have a medical reason to take pain medicine. Your doctor will tell you how much medicine to take. If your doctor wants you to stop the medicine, the dose will be slowly lowered over time to avoid any side effects. You may get drowsy or dizzy. Do  not drive, use machinery, or do anything that needs mental alertness until you know how this medicine affects you. Do not stand or sit up quickly, especially if you are an older patient. This reduces the risk of dizzy or fainting spells. Alcohol can increase or decrease the effects of this medicine. Avoid alcoholic drinks. You may have constipation. Try to have a bowel movement at least every 2 to 3 days. If you do not have a bowel movement for 3 days, call your doctor or health care professional. Your mouth may get dry. Chewing sugarless gum or sucking hard candy, and drinking plenty of  water may help. Contact your doctor if the problem does not go away or is severe. What side effects may I notice from receiving this medicine? Side effects that you should report to your doctor or health care professional as soon as possible: -allergic reactions like skin rash, itching or hives, swelling of the face, lips, or tongue -breathing difficulties, wheezing -confusion -itching -light headedness or fainting spells -redness, blistering, peeling or loosening of the skin, including inside the mouth -seizures Side effects that usually do not require medical attention (report to your doctor or health care professional if they continue or are bothersome): -constipation -dizziness -drowsiness -headache -nausea, vomiting This list may not describe all possible side effects. Call your doctor for medical advice about side effects. You may report side effects to FDA at 1-800-FDA-1088. Where should I keep my medicine? Keep out of the reach of children. This medicine may cause accidental overdose and death if it taken by other adults, children, or pets. Mix any unused medicine with a substance like cat litter or coffee grounds. Then throw the medicine away in a sealed container like a sealed bag or a coffee can with a lid. Do not use the medicine after the expiration date. Store at room temperature between 15 and 30 degrees C (59 and 86 degrees F). NOTE: This sheet is a summary. It may not cover all possible information. If you have questions about this medicine, talk to your doctor, pharmacist, or health care provider.    2016, Elsevier/Gold Standard. (2013-09-22 15:42:09)

## 2016-02-17 NOTE — ED Notes (Signed)
Pt states that she began to have left sided chest pain that woke her from sleep; pt states that she is nauseous and feels lightheaded; pt denies vomiting; pt c/o tingling to left chest and some to left leg

## 2016-03-11 DIAGNOSIS — E119 Type 2 diabetes mellitus without complications: Secondary | ICD-10-CM | POA: Insufficient documentation

## 2016-03-11 DIAGNOSIS — L301 Dyshidrosis [pompholyx]: Secondary | ICD-10-CM | POA: Insufficient documentation

## 2016-04-22 DIAGNOSIS — E538 Deficiency of other specified B group vitamins: Secondary | ICD-10-CM | POA: Insufficient documentation

## 2016-06-05 DIAGNOSIS — Z23 Encounter for immunization: Secondary | ICD-10-CM | POA: Insufficient documentation

## 2016-07-27 ENCOUNTER — Emergency Department (HOSPITAL_BASED_OUTPATIENT_CLINIC_OR_DEPARTMENT_OTHER)
Admission: EM | Admit: 2016-07-27 | Discharge: 2016-07-27 | Disposition: A | Discharge status: Home / Self Care | Payer: No Typology Code available for payment source | Attending: Emergency Medicine | Admitting: Emergency Medicine

## 2016-07-27 ENCOUNTER — Encounter (HOSPITAL_BASED_OUTPATIENT_CLINIC_OR_DEPARTMENT_OTHER): Payer: Self-pay | Admitting: Respiratory Therapy

## 2016-07-27 DIAGNOSIS — Z79899 Other long term (current) drug therapy: Secondary | ICD-10-CM | POA: Diagnosis not present

## 2016-07-27 DIAGNOSIS — Z7984 Long term (current) use of oral hypoglycemic drugs: Secondary | ICD-10-CM | POA: Diagnosis not present

## 2016-07-27 DIAGNOSIS — J069 Acute upper respiratory infection, unspecified: Secondary | ICD-10-CM | POA: Insufficient documentation

## 2016-07-27 DIAGNOSIS — E119 Type 2 diabetes mellitus without complications: Secondary | ICD-10-CM | POA: Diagnosis not present

## 2016-07-27 DIAGNOSIS — R05 Cough: Secondary | ICD-10-CM | POA: Diagnosis present

## 2016-07-27 DIAGNOSIS — B9789 Other viral agents as the cause of diseases classified elsewhere: Secondary | ICD-10-CM

## 2016-07-27 LAB — CBG MONITORING, ED: Glucose-Capillary: 120 mg/dL — ABNORMAL HIGH (ref 65–99)

## 2016-07-27 MED ORDER — ALBUTEROL SULFATE HFA 108 (90 BASE) MCG/ACT IN AERS
2.0000 | INHALATION_SPRAY | RESPIRATORY_TRACT | Status: DC | PRN
  Administered 2016-07-27: 2 via RESPIRATORY_TRACT
  Filled 2016-07-27: qty 6.7

## 2016-07-27 MED ORDER — HYDROCODONE-HOMATROPINE 5-1.5 MG/5ML PO SYRP: 5.0000 mL | ORAL_SOLUTION | Freq: Four times a day (QID) | ORAL | 0 refills | Status: DC | PRN

## 2016-07-27 MED ORDER — DEXAMETHASONE SODIUM PHOSPHATE 10 MG/ML IJ SOLN
5.0000 mg | Freq: Once | INTRAMUSCULAR | Status: AC
  Administered 2016-07-27: 5 mg via INTRAMUSCULAR
  Filled 2016-07-27: qty 1

## 2016-07-27 NOTE — ED Provider Notes (Signed)
[facility]   CSN: [id number] Arrival date & time: 07/27/16  0138     History   Chief Complaint Chief Complaint  Patient presents with  . Cough    HPI Erin Craig is a 55 y.o. female.  Patient presents with complaints of 2 day history of nasal congestion, sore throat, cough. Patient reports that the cough is nonproductive but persistent and keeps her up at night. She has not had nausea, vomiting or diarrhea. No fever.      Past Medical History:  Diagnosis Date  . Bradycardia   . Diabetes mellitus without complication (HCC)   . Heart murmur     There are no active problems to display for this patient.   Past Surgical History:  Procedure Laterality Date  . ABDOMINAL HYSTERECTOMY    . CESAREAN SECTION    . CHOLECYSTECTOMY    . FOOT SURGERY      OB History    No data available       Home Medications    Prior to Admission medications   Medication Sig Start Date End Date Taking? Authorizing Provider  metFORMIN (GLUCOPHAGE) 1000 MG tablet Take 1,000 mg by mouth 1 day or 1 dose.   Yes Historical Provider, MD  acetaminophen (TYLENOL) 325 MG tablet Take 325 mg by mouth every 6 (six) hours as needed. Headache/pain    Historical Provider, MD  esomeprazole (NEXIUM) 20 MG capsule Take 20 mg by mouth daily before breakfast.      Historical Provider, MD  HYDROcodone-homatropine (HYCODAN) 5-1.5 MG/5ML syrup Take 5 mLs by mouth every 6 (six) hours as needed for cough. 07/27/16   Gilda Creasehristopher J Trinten Boudoin, MD  naproxen (NAPROSYN) 500 MG tablet Take 1 tablet (500 mg total) by mouth 2 (two) times daily. 02/17/16   Dione Boozeavid Glick, MD  traMADol (ULTRAM) 50 MG tablet Take 1 tablet (50 mg total) by mouth every 6 (six) hours as needed. 02/17/16   Dione Boozeavid Glick, MD    Family History Family History  Problem Relation Age of Onset  . Alzheimer's disease    . Coronary artery disease    . Breast cancer    . Arthritis    . Hypertension    . Diabetes       Social History Social History  Substance Use Topics  . Smoking status: Never Smoker  . Smokeless tobacco: Never Used  . Alcohol use No     Allergies   Patient has no known allergies.   Review of Systems Review of Systems  Constitutional: Negative for fever.  HENT: Positive for congestion and sore throat.   Respiratory: Positive for cough.   All other systems reviewed and are negative.    Physical Exam Updated Vital Signs BP 139/74 (BP Location: Left Arm)   Pulse 68   Temp 98.5 F (36.9 C) (Oral)   Resp 20   Ht 5\' 4"  (1.626 m)   Wt 175 lb (79.4 kg)   SpO2 98%   BMI 30.04 kg/m   Physical Exam  Constitutional: She is oriented to person, place, and time. She appears well-developed and well-nourished. No distress.  HENT:  Head: Normocephalic and atraumatic.  Right Ear: Hearing normal.  Left Ear: Hearing normal.  Nose: Nose normal.  Mouth/Throat: Oropharynx is clear and moist and mucous membranes are normal.  Eyes: Conjunctivae and EOM are normal. Pupils are equal, round, and reactive to light.  Neck: Normal range of motion. Neck supple.  Cardiovascular: Regular rhythm, S1 normal and  S2 normal.  Exam reveals no gallop and no friction rub.   No murmur heard. Pulmonary/Chest: Effort normal and breath sounds normal. No respiratory distress. She exhibits no tenderness.  Abdominal: Soft. Normal appearance and bowel sounds are normal. There is no hepatosplenomegaly. There is no tenderness. There is no rebound, no guarding, no tenderness at McBurney's point and negative Murphy's sign. No hernia.  Musculoskeletal: Normal range of motion.  Neurological: She is alert and oriented to person, place, and time. She has normal strength. No cranial nerve deficit or sensory deficit. Coordination normal. GCS eye subscore is 4. GCS verbal subscore is 5. GCS motor subscore is 6.  Skin: Skin is warm, dry and intact. No rash noted. No cyanosis.  Psychiatric: She has a normal mood and  affect. Her speech is normal and behavior is normal. Thought content normal.  Nursing note and vitals reviewed.    ED Treatments / Results  Labs (all labs ordered are listed, but only abnormal results are displayed) Labs Reviewed  CBG MONITORING, ED - Abnormal; Notable for the following:       Result Value   Glucose-Capillary 120 (*)    All other components within normal limits    EKG  EKG Interpretation None       Radiology No results found.  Procedures Procedures (including critical care time)  Medications Ordered in ED Medications  dexamethasone (DECADRON) injection 5 mg (not administered)  albuterol (PROVENTIL HFA;VENTOLIN HFA) 108 (90 Base) MCG/ACT inhaler 2 puff (not administered)     Initial Impression / Assessment and Plan / ED Course  I have reviewed the triage vital signs and the nursing notes.  Pertinent labs & imaging results that were available during my care of the patient were reviewed by me and considered in my medical decision making (see chart for details).  Clinical Course   Patient presents with upper respiratory infection symptoms. Lungs are clear, no clinical concern for pneumonia. Oxygenation is normal as are her vital signs. With history of diabetes which is well controlled, able to tolerate low-dose Decadron. Patient will be prescribed albuterol and cough syrup.  Final Clinical Impressions(s) / ED Diagnoses   Final diagnoses:  Viral URI with cough    New Prescriptions New Prescriptions   HYDROCODONE-HOMATROPINE (HYCODAN) 5-1.5 MG/5ML SYRUP    Take 5 mLs by mouth every 6 (six) hours as needed for cough.     Gilda Creasehristopher J Callan Yontz, MD 07/27/16 [phone]

## 2016-07-27 NOTE — ED Triage Notes (Signed)
Pt c/o NP cough, congestion, body aches x 2 days.

## 2016-08-20 ENCOUNTER — Emergency Department (HOSPITAL_BASED_OUTPATIENT_CLINIC_OR_DEPARTMENT_OTHER): Payer: No Typology Code available for payment source

## 2016-08-20 ENCOUNTER — Emergency Department (HOSPITAL_BASED_OUTPATIENT_CLINIC_OR_DEPARTMENT_OTHER)
Admission: EM | Admit: 2016-08-20 | Discharge: 2016-08-20 | Disposition: A | Discharge status: Home / Self Care | Payer: No Typology Code available for payment source | Attending: Emergency Medicine | Admitting: Emergency Medicine

## 2016-08-20 ENCOUNTER — Encounter (HOSPITAL_BASED_OUTPATIENT_CLINIC_OR_DEPARTMENT_OTHER): Payer: Self-pay | Admitting: Emergency Medicine

## 2016-08-20 DIAGNOSIS — J0110 Acute frontal sinusitis, unspecified: Secondary | ICD-10-CM | POA: Diagnosis not present

## 2016-08-20 DIAGNOSIS — Z79899 Other long term (current) drug therapy: Secondary | ICD-10-CM | POA: Insufficient documentation

## 2016-08-20 DIAGNOSIS — E119 Type 2 diabetes mellitus without complications: Secondary | ICD-10-CM | POA: Diagnosis not present

## 2016-08-20 DIAGNOSIS — M791 Myalgia: Secondary | ICD-10-CM | POA: Insufficient documentation

## 2016-08-20 DIAGNOSIS — R059 Cough, unspecified: Secondary | ICD-10-CM

## 2016-08-20 DIAGNOSIS — R05 Cough: Secondary | ICD-10-CM | POA: Diagnosis present

## 2016-08-20 DIAGNOSIS — Z7984 Long term (current) use of oral hypoglycemic drugs: Secondary | ICD-10-CM | POA: Insufficient documentation

## 2016-08-20 LAB — RAPID STREP SCREEN (MED CTR MEBANE ONLY): Streptococcus, Group A Screen (Direct): NEGATIVE

## 2016-08-20 MED ORDER — IPRATROPIUM-ALBUTEROL 0.5-2.5 (3) MG/3ML IN SOLN
3.0000 mL | Freq: Once | RESPIRATORY_TRACT | Status: AC
  Administered 2016-08-20: 3 mL via RESPIRATORY_TRACT
  Filled 2016-08-20: qty 3

## 2016-08-20 MED ORDER — AMOXICILLIN-POT CLAVULANATE 875-125 MG PO TABS: 1.0000 | ORAL_TABLET | Freq: Two times a day (BID) | ORAL | 0 refills | Status: DC

## 2016-08-20 MED ORDER — GUAIFENESIN-CODEINE 100-10 MG/5ML PO SOLN: 5.0000 mL | ORAL | 0 refills | Status: DC | PRN

## 2016-08-20 NOTE — ED Triage Notes (Signed)
Sore throat and  Dry cough x 1 month. Pt has been seen multiple times, has been tx with abx and steroids and symptoms persist.

## 2016-08-20 NOTE — Discharge Instructions (Signed)
Her chest x-ray was negative for pneumonia. Her strep test was negative. I have given you cough syrup use as prescribe. I have also given you an antibiotic given that your sinus pressure has been present for longer than 10 days. If you are not feeling better after this treatment please follow back up with your primary doctor. Use your albuterol inhaler as needed. Return to the ED if he develops any worsening symptoms.

## 2016-08-20 NOTE — ED Notes (Signed)
Pt verbalizes understanding of d/c instructions and denies any further needs at this time. 

## 2016-08-20 NOTE — ED Provider Notes (Signed)
[facility] Provider Note   CSN: [id number] Arrival date & time: 08/20/16  1644  By signing my name below, I, Linna Darner, attest that this documentation has been prepared under the direction and in the presence of Demetrios Loll, PA-C. Electronically Signed: Linna Darner, Scribe. 08/20/2016. 6:05 PM.  History   Chief Complaint Chief Complaint  Patient presents with  . Cough    The history is provided by the patient. No language interpreter was used.     HPI Comments: Erin Craig is a 56 y.o. female who presents to the Emergency Department complaining of a persistent non-productive cough for one month. She reports associated sore throat, body aches, rhinorrhea, sinus pain, and occasional wheezing. She notes her nasal mucus is yellow. She has been here once since onset prior to today and has also been to her PCP twice for the same; she has tried antibiotics, steroids, and an albuterol inhaler with only minimal, temporary improvement of her symptoms. She finished a course of azithromycin last week. Pt has had several strep tests since onset, all of which were negative. No known sick contacts with similar symptoms. No h/o COPD or asthma. She is a non-smoker. She denies fever, chills, cp, abd pain, n/v/d, urinary symptoms or any other associated symptoms.  Past Medical History:  Diagnosis Date  . Bradycardia   . Diabetes mellitus without complication (HCC)   . Heart murmur     There are no active problems to display for this patient.   Past Surgical History:  Procedure Laterality Date  . ABDOMINAL HYSTERECTOMY    . CESAREAN SECTION    . CHOLECYSTECTOMY    . FOOT SURGERY      OB History    No data available       Home Medications    Prior to Admission medications   Medication Sig Start Date End Date Taking? Authorizing Provider  acetaminophen (TYLENOL) 325 MG tablet Take 325 mg by mouth every 6 (six) hours as needed. Headache/pain    Historical  Provider, MD  esomeprazole (NEXIUM) 20 MG capsule Take 20 mg by mouth daily before breakfast.      Historical Provider, MD  HYDROcodone-homatropine (HYCODAN) 5-1.5 MG/5ML syrup Take 5 mLs by mouth every 6 (six) hours as needed for cough. 07/27/16   Gilda Crease, MD  metFORMIN (GLUCOPHAGE) 1000 MG tablet Take 1,000 mg by mouth 1 day or 1 dose.    Historical Provider, MD  naproxen (NAPROSYN) 500 MG tablet Take 1 tablet (500 mg total) by mouth 2 (two) times daily. 02/17/16   Dione Booze, MD  traMADol (ULTRAM) 50 MG tablet Take 1 tablet (50 mg total) by mouth every 6 (six) hours as needed. 02/17/16   Dione Booze, MD    Family History Family History  Problem Relation Age of Onset  . Alzheimer's disease    . Coronary artery disease    . Breast cancer    . Arthritis    . Hypertension    . Diabetes      Social History Social History  Substance Use Topics  . Smoking status: Never Smoker  . Smokeless tobacco: Never Used  . Alcohol use No     Allergies   Patient has no known allergies.   Review of Systems Review of Systems  Constitutional: Negative for chills and fever.  HENT: Positive for rhinorrhea, sinus pain and sore throat.   Respiratory: Positive for cough and wheezing.   Musculoskeletal: Positive for myalgias (generalized).  All other  systems reviewed and are negative.    Physical Exam Updated Vital Signs BP 141/67 (BP Location: Right Arm)   Pulse 63   Temp 98.4 F (36.9 C) (Oral)   Resp 18   Wt 175 lb (79.4 kg)   SpO2 100%   BMI 30.04 kg/m   Physical Exam  Constitutional: She is oriented to person, place, and time. She appears well-developed and well-nourished. No distress.  HENT:  Head: Normocephalic and atraumatic.  Right Ear: Tympanic membrane, external ear and ear canal normal.  Left Ear: Tympanic membrane, external ear and ear canal normal.  Nose: Mucosal edema and rhinorrhea present. Right sinus exhibits maxillary sinus tenderness and frontal  sinus tenderness. Left sinus exhibits maxillary sinus tenderness and frontal sinus tenderness.  Mouth/Throat: Uvula is midline, oropharynx is clear and moist and mucous membranes are normal.  Eyes: Conjunctivae and EOM are normal. Right eye exhibits no discharge. Left eye exhibits no discharge.  Neck: Normal range of motion. Neck supple. No tracheal deviation present.  Cardiovascular: Normal rate, regular rhythm, normal heart sounds and intact distal pulses.   Pulmonary/Chest: Effort normal and breath sounds normal. No respiratory distress. She has no wheezes. She exhibits no tenderness.  Musculoskeletal: Normal range of motion.  Lymphadenopathy:    She has no cervical adenopathy.  Neurological: She is alert and oriented to person, place, and time.  Skin: Skin is warm and dry. Capillary refill takes less than 2 seconds.  Psychiatric: She has a normal mood and affect. Her behavior is normal.  Nursing note and vitals reviewed.    ED Treatments / Results  Labs (all labs ordered are listed, but only abnormal results are displayed) Labs Reviewed  RAPID STREP SCREEN (NOT [facility])  CULTURE, GROUP A STREP [facility])    EKG  EKG Interpretation None       Radiology Dg Chest 2 View  Result Date: 08/20/2016 CLINICAL DATA:  Cough for 1 month, history diabetes mellitus EXAM: CHEST  2 VIEW COMPARISON:  02/17/2016 FINDINGS: Upper normal size of cardiac silhouette. Tortuous aorta. Mediastinal contours and pulmonary vascularity normal. Lungs clear. No pleural effusion or pneumothorax. Bones unremarkable. IMPRESSION: No acute abnormalities. Electronically Signed   By: Ulyses SouthwardMark  Boles M.D.   On: 08/20/2016 17:28    Procedures Procedures (including critical care time)  DIAGNOSTIC STUDIES: Oxygen Saturation is 100% on RA, normal by my interpretation.    COORDINATION OF CARE: 6:10 PM Discussed treatment plan with pt at bedside and pt agreed to plan.  Medications Ordered in ED Medications    ipratropium-albuterol (DUONEB) 0.5-2.5 (3) MG/3ML nebulizer solution 3 mL (not administered)     Initial Impression / Assessment and Plan / ED Course  I have reviewed the triage vital signs and the nursing notes.  Pertinent labs & imaging results that were available during my care of the patient were reviewed by me and considered in my medical decision making (see chart for details).  Clinical Course   Patient presents to the ED with complaint of sore throat and dry cough 1 month. She does see multiple times and given antibiotics and started her symptoms have persisted. Patient denies any fevers or productive cough. She endorses generalized muscle aches. Chest x-ray was unremarkable. Strep test was negative. Lungs are clear to auscultation bilaterally. Patient given DuoNeb with improvement in her symptoms. Patient does have significant sinus pressure and tenderness to palpation of the maxillary and frontal sinus. Patient with yellow discharge. Her symptoms have been persistent for the past month. She  was treated with azithromycin approximately 2 weeks ago. No change in symptoms. Severe symptoms have been present for greater than 10 days with purulent nasal discharge and maxillary sinus pain.  Concern for acute bacterial rhinosinusitis.  Patient discharged with Augmentin.  Instructions given for warm saline nasal wash and recommendations for follow-up with primary care physician.  Pt is hemodynamically stable, in NAD, & able to ambulate in the ED. Pain has been managed & has no complaints prior to dc. Pt is comfortable with above plan and is stable for discharge at this time. All questions were answered prior to disposition. Strict return precautions for f/u to the ED were discussed.     Final Clinical Impressions(s) / ED Diagnoses   Final diagnoses:  Cough  Acute non-recurrent frontal sinusitis    New Prescriptions Discharge Medication List as of 08/20/2016  6:58 PM    START taking these  medications   Details  amoxicillin-clavulanate (AUGMENTIN) 875-125 MG tablet Take 1 tablet by mouth every 12 (twelve) hours., Starting Thu 08/20/2016, Print    guaiFENesin-codeine 100-10 MG/5ML syrup Take 5 mLs by mouth every 4 (four) hours as needed for cough. orally every 4 hours; MAX 6 doses/24 hours, Starting Thu 08/20/2016, Print       I personally performed the services described in this documentation, which was scribed in my presence. The recorded information has been reviewed and is accurate.    Rise Mu, PA-C 08/21/16 0100    Jacalyn Lefevre, MD 08/25/16 [phone]

## 2016-08-23 LAB — CULTURE, GROUP A STREP (THRC)

## 2016-11-18 DIAGNOSIS — N3000 Acute cystitis without hematuria: Secondary | ICD-10-CM | POA: Insufficient documentation

## 2016-11-20 ENCOUNTER — Emergency Department (HOSPITAL_BASED_OUTPATIENT_CLINIC_OR_DEPARTMENT_OTHER)
Admission: EM | Admit: 2016-11-20 | Discharge: 2016-11-20 | Disposition: A | Discharge status: Home / Self Care | Payer: No Typology Code available for payment source | Attending: Emergency Medicine | Admitting: Emergency Medicine

## 2016-11-20 ENCOUNTER — Encounter (HOSPITAL_BASED_OUTPATIENT_CLINIC_OR_DEPARTMENT_OTHER): Payer: Self-pay | Admitting: *Deleted

## 2016-11-20 DIAGNOSIS — Z7984 Long term (current) use of oral hypoglycemic drugs: Secondary | ICD-10-CM | POA: Diagnosis not present

## 2016-11-20 DIAGNOSIS — N3000 Acute cystitis without hematuria: Secondary | ICD-10-CM | POA: Diagnosis not present

## 2016-11-20 DIAGNOSIS — E119 Type 2 diabetes mellitus without complications: Secondary | ICD-10-CM | POA: Diagnosis not present

## 2016-11-20 DIAGNOSIS — Z79899 Other long term (current) drug therapy: Secondary | ICD-10-CM | POA: Insufficient documentation

## 2016-11-20 DIAGNOSIS — R109 Unspecified abdominal pain: Secondary | ICD-10-CM

## 2016-11-20 LAB — URINALYSIS, ROUTINE W REFLEX MICROSCOPIC
Bilirubin Urine: NEGATIVE
Glucose, UA: NEGATIVE mg/dL
Hgb urine dipstick: NEGATIVE
Ketones, ur: NEGATIVE mg/dL
Nitrite: POSITIVE — AB
Protein, ur: NEGATIVE mg/dL
Specific Gravity, Urine: 1.003 — ABNORMAL LOW (ref 1.005–1.030)
pH: 6 (ref 5.0–8.0)

## 2016-11-20 LAB — URINALYSIS, MICROSCOPIC (REFLEX): RBC / HPF: NONE SEEN RBC/hpf (ref 0–5)

## 2016-11-20 LAB — CBG MONITORING, ED: Glucose-Capillary: 130 mg/dL — ABNORMAL HIGH (ref 65–99)

## 2016-11-20 MED ORDER — CEPHALEXIN 500 MG PO CAPS: 500.0000 mg | ORAL_CAPSULE | Freq: Two times a day (BID) | ORAL | 0 refills | Status: DC

## 2016-11-20 NOTE — ED Provider Notes (Signed)
[facility]   CSN: [id number] Arrival date & time: 11/20/16  1912  By signing my name below, I, Erin Craig, attest that this documentation has been prepared under the direction and in the presence of non-physician practitioner, Glenford Bayley, PA-C. Electronically Signed: Modena Craig, Scribe. 11/20/2016. 8:19 PM.  History   Chief Complaint Chief Complaint  Patient presents with  . Back Pain   The history is provided by the patient. No language interpreter was used.   HPI Comments: Erin Craig is a 56 y.o. female with a PMHx of DM who presents to the Emergency Department complaining of constant moderate left flank pain that started today. She had an epidural done last month. She did not have any pain until today, when she had sudden onset of pain without any known trauma. She also had low blood sugar today. No treatment PTA. She reports associated urinary frequency that started 2 days ago. She was seen for frequency and given Ciprofloxacin and Pyridium without urine culture. Denies any hx of kidey stones, hx of cancer, hx of IV drug use, numbness/tingling, saddle paresthesias, bowel/bladder incontinence, abdominal pain, pelvic pain, vaginal bleeding, or other complaints at this time.    PCP: Erin Dials, MD  Past Medical History:  Diagnosis Date  . Bradycardia   . Diabetes mellitus without complication (HCC)   . Heart murmur     There are no active problems to display for this patient.   Past Surgical History:  Procedure Laterality Date  . ABDOMINAL HYSTERECTOMY    . CESAREAN SECTION    . CHOLECYSTECTOMY    . FOOT SURGERY      OB History    No data available       Home Medications    Prior to Admission medications   Medication Sig Start Date End Date Taking? Authorizing Provider  atorvastatin (LIPITOR) 10 MG tablet Take 10 mg by mouth daily.   Yes Historical Provider, MD  ciprofloxacin (CIPRO) 500 MG tablet Take 500 mg by mouth 2 (two)  times daily.   Yes Historical Provider, MD  glipiZIDE (GLUCOTROL) 5 MG tablet Take 10 mg by mouth daily before breakfast.   Yes Historical Provider, MD  lisinopril (PRINIVIL,ZESTRIL) 5 MG tablet Take 5 mg by mouth daily.   Yes Historical Provider, MD  phenazopyridine (PYRIDIUM) 100 MG tablet Take 100 mg by mouth 3 (three) times daily as needed for pain.   Yes Historical Provider, MD  acetaminophen (TYLENOL) 325 MG tablet Take 325 mg by mouth every 6 (six) hours as needed. Headache/pain    Historical Provider, MD  cephALEXin (KEFLEX) 500 MG capsule Take 1 capsule (500 mg total) by mouth 2 (two) times daily. 11/20/16   Emi Holes, PA-C  esomeprazole (NEXIUM) 20 MG capsule Take 20 mg by mouth daily before breakfast.      Historical Provider, MD  guaiFENesin-codeine 100-10 MG/5ML syrup Take 5 mLs by mouth every 4 (four) hours as needed for cough. orally every 4 hours; MAX 6 doses/24 hours 08/20/16   Rise Mu, PA-C  HYDROcodone-homatropine (HYCODAN) 5-1.5 MG/5ML syrup Take 5 mLs by mouth every 6 (six) hours as needed for cough. 07/27/16   Gilda Crease, MD  metFORMIN (GLUCOPHAGE) 1000 MG tablet Take 1,000 mg by mouth 1 day or 1 dose.    Historical Provider, MD  naproxen (NAPROSYN) 500 MG tablet Take 1 tablet (500 mg total) by mouth 2 (two) times daily. 02/17/16   Dione Booze, MD  traMADol Janean Sark)  50 MG tablet Take 1 tablet (50 mg total) by mouth every 6 (six) hours as needed. 02/17/16   Dione Booze, MD    Family History Family History  Problem Relation Age of Onset  . Alzheimer's disease    . Coronary artery disease    . Breast cancer    . Arthritis    . Hypertension    . Diabetes      Social History Social History  Substance Use Topics  . Smoking status: Never Smoker  . Smokeless tobacco: Never Used  . Alcohol use No     Allergies   Patient has no known allergies.   Review of Systems Review of Systems  Constitutional: Negative for chills and fever.  HENT:  Negative for facial swelling and sore throat.   Respiratory: Negative for shortness of breath.   Cardiovascular: Negative for chest pain.  Gastrointestinal: Negative for abdominal pain, nausea and vomiting.  Genitourinary: Positive for frequency. Negative for dysuria, pelvic pain and vaginal pain.  Musculoskeletal: Positive for back pain (Left lower).  Skin: Negative for rash and wound.  Neurological: Negative for numbness and headaches.  Psychiatric/Behavioral: The patient is not nervous/anxious.      Physical Exam Updated Vital Signs BP 126/64   Pulse (!) 58   Temp 98.1 F (36.7 C)   Resp 16   Ht  (1.626 m)   Wt 170 lb (77.1 kg)   SpO2 100%   BMI 29.18 kg/m   Physical Exam  Constitutional: She appears well-developed and well-nourished. No distress.  HENT:  Head: Normocephalic and atraumatic.  Mouth/Throat: Oropharynx is clear and moist. No oropharyngeal exudate.  Eyes: Conjunctivae are normal. Pupils are equal, round, and reactive to light. Right eye exhibits no discharge. Left eye exhibits no discharge. No scleral icterus.  Neck: Normal range of motion. Neck supple. No thyromegaly present.  Cardiovascular: Normal rate, regular rhythm, normal heart sounds and intact distal pulses.  Exam reveals no gallop and no friction rub.   No murmur heard. Pulmonary/Chest: Effort normal and breath sounds normal. No stridor. No respiratory distress. She has no wheezes. She has no rales.  Abdominal: Soft. Bowel sounds are normal. She exhibits no distension. There is tenderness in the suprapubic area. There is no rebound and no guarding.    Genitourinary:  Genitourinary Comments: No CVA tenderness.   Musculoskeletal: She exhibits no edema.  No midline lumbar or muscular tenderness. 5/5 strength and sensation to BLE.   Lymphadenopathy:    She has no cervical adenopathy.  Neurological: She is alert. Coordination normal.  Skin: Skin is warm and dry. No rash noted. She is not  diaphoretic. No pallor.  Psychiatric: She has a normal mood and affect.  Nursing note and vitals reviewed.    ED Treatments / Results  DIAGNOSTIC STUDIES: Oxygen Saturation is 100% on RA, normal by my interpretation.    COORDINATION OF CARE: 8:23 PM- Pt advised of plan for treatment and pt agrees.  Labs (all labs ordered are listed, but only abnormal results are displayed) Labs Reviewed  URINALYSIS, ROUTINE W REFLEX MICROSCOPIC - Abnormal; Notable for the following:       Result Value   Color, Urine ORANGE (*)    Specific Gravity, Urine 1.003 (*)    Nitrite POSITIVE (*)    Leukocytes, UA SMALL (*)    All other components within normal limits  URINALYSIS, MICROSCOPIC (REFLEX) - Abnormal; Notable for the following:    Bacteria, UA RARE (*)    Squamous  Epithelial / LPF 0-5 (*)    All other components within normal limits  CBG MONITORING, ED - Abnormal; Notable for the following:    Glucose-Capillary 130 (*)    All other components within normal limits  URINE CULTURE  CBG MONITORING, ED    EKG  EKG Interpretation None       Radiology No results found.  Procedures Procedures (including critical care time)  Medications Ordered in ED Medications - No data to display   Initial Impression / Assessment and Plan / ED Course  I have reviewed the triage vital signs and the nursing notes.  Pertinent labs & imaging results that were available during my care of the patient were reviewed by me and considered in my medical decision making (see chart for details).     Patient with probable incompletely treated UTI. No muscular tenderness. Mild left abdominal/flank tenderness. No CVA tenderness. UA shows small leukocytes. Urine culture sent. Will change Cipro to Keflex. Follow-up to PCP if symptoms are not improving. Return precautions discussed. Patient understands and agrees with plan. Patient vitals stable throughout ED course and discharged in satisfactory condition. I  discussed patient case with Dr. Fredderick Phenix who guided the patient's management and agrees with plan.   Final Clinical Impressions(s) / ED Diagnoses   Final diagnoses:  Left flank pain  Acute cystitis without hematuria    New Prescriptions Discharge Medication List as of 11/20/2016  8:36 PM     I personally performed the services described in this documentation, which was scribed in my presence. The recorded information has been reviewed and is accurate.     [address], PA-C 11/23/16 4098    Rolan Bucco, MD 12/01/16 1201

## 2016-11-20 NOTE — ED Triage Notes (Signed)
Pt c/o left lower back pain and "low blood sugar" x 1 day

## 2016-11-20 NOTE — Discharge Instructions (Signed)
Medications: Keflex  Treatment: Take Keflex twice daily for 5 days. Make sure to finish all this medication. Make sure to drink plenty of fluids. He can take ibuprofen and/or Tylenol as prescribed over-the-counter for your pain.  Follow-up: Please follow-up with your primary care provider on Monday for symptoms are not improving. Please return to emergency department if you develop any new or worsening symptoms including fever, intractable vomiting, worsening pain, or any other new or concerning symptoms.

## 2016-11-20 NOTE — ED Notes (Signed)
BS 130 in triage

## 2016-11-22 LAB — URINE CULTURE
Culture: NO GROWTH
Special Requests: NORMAL

## 2016-11-28 ENCOUNTER — Encounter (HOSPITAL_BASED_OUTPATIENT_CLINIC_OR_DEPARTMENT_OTHER): Payer: Self-pay | Admitting: *Deleted

## 2016-11-28 ENCOUNTER — Emergency Department (HOSPITAL_BASED_OUTPATIENT_CLINIC_OR_DEPARTMENT_OTHER)
Admission: EM | Admit: 2016-11-28 | Discharge: 2016-11-28 | Disposition: A | Discharge status: Home / Self Care | Payer: No Typology Code available for payment source | Attending: Emergency Medicine | Admitting: Emergency Medicine

## 2016-11-28 DIAGNOSIS — E119 Type 2 diabetes mellitus without complications: Secondary | ICD-10-CM | POA: Insufficient documentation

## 2016-11-28 DIAGNOSIS — H6122 Impacted cerumen, left ear: Secondary | ICD-10-CM | POA: Diagnosis not present

## 2016-11-28 DIAGNOSIS — H60392 Other infective otitis externa, left ear: Secondary | ICD-10-CM | POA: Diagnosis not present

## 2016-11-28 DIAGNOSIS — H9202 Otalgia, left ear: Secondary | ICD-10-CM | POA: Diagnosis present

## 2016-11-28 DIAGNOSIS — Z7984 Long term (current) use of oral hypoglycemic drugs: Secondary | ICD-10-CM | POA: Diagnosis not present

## 2016-11-28 MED ORDER — CIPROFLOXACIN-DEXAMETHASONE 0.3-0.1 % OT SUSP
4.0000 [drp] | Freq: Two times a day (BID) | OTIC | Status: DC
  Administered 2016-11-28: 4 [drp] via OTIC
  Filled 2016-11-28: qty 7.5

## 2016-11-28 NOTE — ED Triage Notes (Signed)
c/o diff hearing out of left ea onset yesterday w left facial tightness,  Denies drainage from ear

## 2016-11-28 NOTE — ED Provider Notes (Signed)
[facility] Provider Note: Erin Dell, MD, FACEP  CSN: [id number] MRN: [id number] ARRIVAL: 11/28/16 at 0321 ROOM: [facility]   CHIEF COMPLAINT  Hearing Problem   HISTORY OF PRESENT ILLNESS  Erin Craig is a 56 y.o. female who complains of pain in her left ear that began about 24 hours ago. She describes the pain as a tightness that radiates to the left side of her face. She rates it as a 7 out of 10. She also has the sensation of her left external auditory canal being plugged and limiting her ability to hear with her left ear. She denies drainage. She denies fever. Her only other symptom is dysphonia which developed later yesterday. She denies sore throat. Pain is not worse with movement of the external ear but is worse when the ear canal is instrumented.   Past Medical History:  Diagnosis Date  . Bradycardia   . Diabetes mellitus without complication (HCC)   . Heart murmur     Past Surgical History:  Procedure Laterality Date  . ABDOMINAL HYSTERECTOMY    . CESAREAN SECTION    . CHOLECYSTECTOMY    . FOOT SURGERY      Family History  Problem Relation Age of Onset  . Alzheimer's disease    . Coronary artery disease    . Breast cancer    . Arthritis    . Hypertension    . Diabetes      Social History  Substance Use Topics  . Smoking status: Never Smoker  . Smokeless tobacco: Never Used  . Alcohol use No    Prior to Admission medications   Medication Sig Start Date End Date Taking? Authorizing Provider  acetaminophen (TYLENOL) 325 MG tablet Take 325 mg by mouth every 6 (six) hours as needed. Headache/pain    Historical Provider, MD  atorvastatin (LIPITOR) 10 MG tablet Take 10 mg by mouth daily.    Historical Provider, MD  cephALEXin (KEFLEX) 500 MG capsule Take 1 capsule (500 mg total) by mouth 2 (two) times daily. 11/20/16   Emi Holes, PA-C  ciprofloxacin (CIPRO) 500 MG tablet Take 500 mg by mouth 2 (two) times daily.    Historical Provider, MD   esomeprazole (NEXIUM) 20 MG capsule Take 20 mg by mouth daily before breakfast.      Historical Provider, MD  glipiZIDE (GLUCOTROL) 5 MG tablet Take 10 mg by mouth daily before breakfast.    Historical Provider, MD  guaiFENesin-codeine 100-10 MG/5ML syrup Take 5 mLs by mouth every 4 (four) hours as needed for cough. orally every 4 hours; MAX 6 doses/24 hours 08/20/16   Rise Mu, PA-C  HYDROcodone-homatropine (HYCODAN) 5-1.5 MG/5ML syrup Take 5 mLs by mouth every 6 (six) hours as needed for cough. 07/27/16   Gilda Crease, MD  lisinopril (PRINIVIL,ZESTRIL) 5 MG tablet Take 5 mg by mouth daily.    Historical Provider, MD  metFORMIN (GLUCOPHAGE) 1000 MG tablet Take 1,000 mg by mouth 1 day or 1 dose.    Historical Provider, MD  naproxen (NAPROSYN) 500 MG tablet Take 1 tablet (500 mg total) by mouth 2 (two) times daily. 02/17/16   Dione Booze, MD  phenazopyridine (PYRIDIUM) 100 MG tablet Take 100 mg by mouth 3 (three) times daily as needed for pain.    Historical Provider, MD  traMADol (ULTRAM) 50 MG tablet Take 1 tablet (50 mg total) by mouth every 6 (six) hours as needed. 02/17/16   Dione Booze, MD    Allergies  Patient has no known allergies.   REVIEW OF SYSTEMS  Negative except as noted here or in the History of Present Illness.   PHYSICAL EXAMINATION  Initial Vital Signs Blood pressure 134/64, pulse 60, temperature 98.4 F (36.9 C), temperature source Oral, resp. rate 18, height  (1.626 m), weight 170 lb (77.1 kg), SpO2 100 %.  Examination General: Well-developed, well-nourished female in no acute distress; appearance consistent with age of record HENT: normocephalic; atraumatic; right TM normal; left TM obscured by cerumen, no drainage seen; no pain on movement of left external ear; pain on placement of otoscope speculum in left ear; no tenderness, erythema or warmth of left side of face; dysphonia; pharynx poorly visualized due to patient's intolerance of a tongue  depressor Eyes: pupils equal, round and reactive to light; extraocular muscles intact Neck: supple Heart: regular rate and rhythm Lungs: clear to auscultation bilaterally Abdomen: soft; nondistended; nontender Extremities: No deformity; full range of motion Neurologic: Awake, alert and oriented; motor function intact in all extremities and symmetric; no facial droop Skin: Warm and dry Psychiatric: Flat affect   RESULTS  Summary of this visit's results, reviewed by myself:   EKG Interpretation  Date/Time:    Ventricular Rate:    PR Interval:    QRS Duration:   QT Interval:    QTC Calculation:   R Axis:     Text Interpretation:        Laboratory Studies: No results found for this or any previous visit (from the past 24 hour(s)). Imaging Studies: No results found.  ED COURSE  Nursing notes and initial vitals signs, including pulse oximetry, reviewed.  Vitals:   11/28/16 0340 11/28/16 0342  BP: 134/64   Pulse: 60   Resp: 18   Temp: 98.4 F (36.9 C)   TempSrc: Oral   SpO2: 100%   Weight:  170 lb (77.1 kg)  Height:   (1.626 m)   5:15 AM The patient's discomfort is such that I do not believe earwax removal would be tolerated at this time. She is diabetic so we will treat with Ciprodex drops and refer to ENT for definitive cerumen removal once any acute infection is cleared up.  PROCEDURES    ED DIAGNOSES     ICD-9-CM ICD-10-CM   1. Other infective acute otitis externa of left ear 380.10 H60.392   2. Excessive cerumen in ear canal, left 380.4 H61.22        Paula Libra, MD 11/28/16 [phone]

## 2016-12-03 ENCOUNTER — Ambulatory Visit (INDEPENDENT_AMBULATORY_CARE_PROVIDER_SITE_OTHER): Payer: No Typology Code available for payment source | Admitting: Otolaryngology

## 2016-12-03 DIAGNOSIS — H900 Conductive hearing loss, bilateral: Secondary | ICD-10-CM | POA: Diagnosis not present

## 2016-12-03 DIAGNOSIS — H6123 Impacted cerumen, bilateral: Secondary | ICD-10-CM

## 2017-08-06 ENCOUNTER — Encounter (HOSPITAL_BASED_OUTPATIENT_CLINIC_OR_DEPARTMENT_OTHER): Payer: Self-pay | Admitting: *Deleted

## 2017-08-06 ENCOUNTER — Emergency Department (HOSPITAL_BASED_OUTPATIENT_CLINIC_OR_DEPARTMENT_OTHER)
Admission: EM | Admit: 2017-08-06 | Discharge: 2017-08-06 | Disposition: A | Discharge status: Home / Self Care | Payer: PRIVATE HEALTH INSURANCE | Attending: Emergency Medicine | Admitting: Emergency Medicine

## 2017-08-06 ENCOUNTER — Other Ambulatory Visit: Payer: Self-pay

## 2017-08-06 DIAGNOSIS — R0981 Nasal congestion: Secondary | ICD-10-CM | POA: Diagnosis not present

## 2017-08-06 DIAGNOSIS — J029 Acute pharyngitis, unspecified: Secondary | ICD-10-CM | POA: Insufficient documentation

## 2017-08-06 DIAGNOSIS — M791 Myalgia, unspecified site: Secondary | ICD-10-CM | POA: Insufficient documentation

## 2017-08-06 DIAGNOSIS — R6883 Chills (without fever): Secondary | ICD-10-CM | POA: Insufficient documentation

## 2017-08-06 LAB — RAPID STREP SCREEN (MED CTR MEBANE ONLY): Streptococcus, Group A Screen (Direct): NEGATIVE

## 2017-08-06 MED ORDER — LIDOCAINE VISCOUS 2 % MT SOLN: 20.0000 mL | OROMUCOSAL | 0 refills | Status: DC | PRN

## 2017-08-06 MED ORDER — DEXAMETHASONE SODIUM PHOSPHATE 10 MG/ML IJ SOLN
10.0000 mg | Freq: Once | INTRAMUSCULAR | Status: AC
  Administered 2017-08-06: 10 mg
  Filled 2017-08-06: qty 1

## 2017-08-06 MED FILL — LIDOCAINE 2% VISCOUS SOLN: 2 | 2 days supply | Qty: 100 | Fill #0

## 2017-08-06 NOTE — ED Provider Notes (Signed)
[facility] EMERGENCY DEPARTMENT Provider Note   CSN: [id number] Arrival date & time: 08/06/17  [id number]     History   Chief Complaint Chief Complaint  Patient presents with  . Sore Throat    HPI Erin Craig is a 56 y.o. female.  HPI 56 year old African-American female past medical history significant for diabetes presents to the emergency department today for evaluation of URI symptoms with associated sore throat.  Patient states that for the past 2 days she started developing sore throat.  States that it is painful to swallow.  States that it progressed to her losing her voice.  Patient states that she has 3 grandchildren with URI symptoms over the past week that have been treated in the ED.  Patient reports rhinorrhea, nasal congestion with yellow-green discharge.  Denies any associated productive cough.  Patient denies any associated otalgia or fevers but does report chills and body aches.  Patient has not taking for her symptoms prior to arrival.  Nothing makes better or worse. Past Medical History:  Diagnosis Date  . Bradycardia   . Diabetes mellitus without complication (HCC)   . Heart murmur     There are no active problems to display for this patient.   Past Surgical History:  Procedure Laterality Date  . ABDOMINAL HYSTERECTOMY    . CESAREAN SECTION    . CHOLECYSTECTOMY    . FOOT SURGERY      OB History    No data available       Home Medications    Prior to Admission medications   Medication Sig Start Date End Date Taking? Authorizing Provider  atorvastatin (LIPITOR) 10 MG tablet Take 10 mg by mouth daily.   Yes [provider]  cephALEXin (KEFLEX) 500 MG capsule Take 1 capsule (500 mg total) by mouth 2 (two) times daily. 11/20/16  Yes Law, Waylan BogaAlexandra M, PA-C  ciprofloxacin (CIPRO) 500 MG tablet Take 500 mg by mouth 2 (two) times daily.   Yes [provider]  glipiZIDE (GLUCOTROL) 5 MG tablet Take 10 mg by mouth daily before  breakfast.   Yes [provider]  guaiFENesin-codeine 100-10 MG/5ML syrup Take 5 mLs by mouth every 4 (four) hours as needed for cough. orally every 4 hours; MAX 6 doses/24 hours 08/20/16  Yes Leaphart, Lynann BeaverKenneth T, PA-C  HYDROcodone-homatropine (HYCODAN) 5-1.5 MG/5ML syrup Take 5 mLs by mouth every 6 (six) hours as needed for cough. 07/27/16  Yes Pollina, Canary Brimhristopher J, MD  lisinopril (PRINIVIL,ZESTRIL) 5 MG tablet Take 5 mg by mouth daily.   Yes [provider]  metFORMIN (GLUCOPHAGE) 1000 MG tablet Take 1,000 mg by mouth 1 day or 1 dose.   Yes [provider]  acetaminophen (TYLENOL) 325 MG tablet Take 325 mg by mouth every 6 (six) hours as needed. Headache/pain    [provider]  esomeprazole (NEXIUM) 20 MG capsule Take 20 mg by mouth daily before breakfast.      [provider]  naproxen (NAPROSYN) 500 MG tablet Take 1 tablet (500 mg total) by mouth 2 (two) times daily. 02/17/16   Dione BoozeGlick, David, MD  phenazopyridine (PYRIDIUM) 100 MG tablet Take 100 mg by mouth 3 (three) times daily as needed for pain.    [provider]  traMADol (ULTRAM) 50 MG tablet Take 1 tablet (50 mg total) by mouth every 6 (six) hours as needed. 02/17/16   Dione BoozeGlick, David, MD    Family History Family History  Problem Relation Age of Onset  .  Alzheimer's disease Unknown   . Coronary artery disease Unknown   . Breast cancer Unknown   . Arthritis Unknown   . Hypertension Unknown   . Diabetes Unknown     Social History Social History   Tobacco Use  . Smoking status: Never Smoker  . Smokeless tobacco: Never Used  Substance Use Topics  . Alcohol use: No  . Drug use: No     Allergies   Patient has no known allergies.   Review of Systems Review of Systems  Constitutional: Positive for chills. Negative for fever.  HENT: Positive for congestion, postnasal drip, rhinorrhea, sore throat, trouble swallowing and voice change.   Eyes: Negative for visual  disturbance.  Respiratory: Negative for cough, shortness of breath and wheezing.   Gastrointestinal: Negative for vomiting.  Musculoskeletal: Positive for myalgias.  Skin: Negative for rash.     Physical Exam Updated Vital Signs BP 101/73 (BP Location: Right Arm)   Pulse 64   Temp 98.4 F (36.9 C) (Oral)   Resp 18   Ht 5\' 4"  (1.626 m)   Wt 74.8 kg (165 lb)   SpO2 100%   BMI 28.32 kg/m   Physical Exam  Constitutional: She appears well-developed and well-nourished. No distress.  HENT:  Head: Normocephalic and atraumatic.  Right Ear: Tympanic membrane and ear canal normal.  Left Ear: Tympanic membrane and ear canal normal.  Mouth/Throat: Uvula is midline and mucous membranes are normal. No trismus in the jaw. No uvula swelling. Posterior oropharyngeal edema and posterior oropharyngeal erythema present. No oropharyngeal exudate or tonsillar abscesses. Tonsils are 2+ on the right. Tonsils are 2+ on the left. No tonsillar exudate.  She has a hoarse voice  Eyes: Right eye exhibits no discharge. Left eye exhibits no discharge. No scleral icterus.  Neck: Normal range of motion. Neck supple.  Pulmonary/Chest: Effort normal and breath sounds normal. No stridor. No respiratory distress. She has no wheezes. She has no rhonchi. She has no rales. She exhibits no tenderness.  Musculoskeletal: Normal range of motion.  Lymphadenopathy:    She has no cervical adenopathy.  Neurological: She is alert.  Skin: Skin is warm and dry. Capillary refill takes less than 2 seconds. No pallor.  Psychiatric: Her behavior is normal. Judgment and thought content normal.  Nursing note and vitals reviewed.    ED Treatments / Results  Labs (all labs ordered are listed, but only abnormal results are displayed) Labs Reviewed  RAPID STREP SCREEN (NOT AT [facility])    EKG  EKG Interpretation None       Radiology No results found.  Procedures Procedures (including critical care time)  Medications  Ordered in ED Medications  dexamethasone (DECADRON) injection 10 mg (not administered)     Initial Impression / Assessment and Plan / ED Course  I have reviewed the triage vital signs and the nursing notes.  Pertinent labs & imaging results that were available during my care of the patient were reviewed by me and considered in my medical decision making (see chart for details).     Pt afebrile without tonsillar exudate, negative strep. Presents with mild cervical lymphadenopathy, & dysphagia; diagnosis of viral pharyngitis.  Possibly laryngitis.  No abx indicated. DC w symptomatic tx for pain  Pt does not appear dehydrated, but did discuss importance of water rehydration. Presentation non concerning for PTA or infxn spread to soft tissue. No trismus or uvula deviation. Specific return precautions discussed. Pt able to drink water in ED without difficulty with intact  air way.  Steroids given in the ED encourage patient to closely monitor blood sugars  Pt is hemodynamically stable, in NAD, & able to ambulate in the ED. Evaluation does not show pathology that would require ongoing emergent intervention or inpatient treatment. I explained the diagnosis to the patient. Pain has been managed & has no complaints prior to dc. Pt is comfortable with above plan and is stable for discharge at this time. All questions were answered prior to disposition. Strict return precautions for f/u to the ED were discussed. Encouraged follow up with PCP.    Final Clinical Impressions(s) / ED Diagnoses   Final diagnoses:  Sore throat    ED Discharge Orders        Ordered    lidocaine (XYLOCAINE) 2 % solution  As needed     08/06/17 1005       Wallace Keller 08/06/17 1006    Raeford Razor, MD 08/06/17 1539

## 2017-08-06 NOTE — ED Triage Notes (Signed)
Pt ha  Had a aching and sore throat X 2 days, Pt has lost her voice.

## 2017-08-06 NOTE — Discharge Instructions (Signed)
Your strep test was negative.  This is likely a viral illness.  Motrin and Tylenol at home for pain and fevers.  Hot tea and honey to help soothe your throat.  Have given you lidocaine rinse to help numb the throat if the pain persist.  Have been given steroids in the ED for your sore throat.  Make sure that you monitor your blood sugars very closely.  Follow-up with your primary care doctor if symptoms not improving or return to the ED with any worsening symptoms.

## 2017-08-08 ENCOUNTER — Emergency Department (HOSPITAL_BASED_OUTPATIENT_CLINIC_OR_DEPARTMENT_OTHER): Payer: PRIVATE HEALTH INSURANCE

## 2017-08-08 ENCOUNTER — Other Ambulatory Visit: Payer: Self-pay

## 2017-08-08 ENCOUNTER — Encounter (HOSPITAL_BASED_OUTPATIENT_CLINIC_OR_DEPARTMENT_OTHER): Payer: Self-pay | Admitting: *Deleted

## 2017-08-08 ENCOUNTER — Emergency Department (HOSPITAL_BASED_OUTPATIENT_CLINIC_OR_DEPARTMENT_OTHER)
Admission: EM | Admit: 2017-08-08 | Discharge: 2017-08-08 | Disposition: A | Discharge status: Home / Self Care | Payer: PRIVATE HEALTH INSURANCE | Attending: Emergency Medicine | Admitting: Emergency Medicine

## 2017-08-08 DIAGNOSIS — J050 Acute obstructive laryngitis [croup]: Secondary | ICD-10-CM | POA: Insufficient documentation

## 2017-08-08 DIAGNOSIS — E119 Type 2 diabetes mellitus without complications: Secondary | ICD-10-CM | POA: Diagnosis not present

## 2017-08-08 DIAGNOSIS — J040 Acute laryngitis: Secondary | ICD-10-CM

## 2017-08-08 DIAGNOSIS — J069 Acute upper respiratory infection, unspecified: Secondary | ICD-10-CM | POA: Diagnosis not present

## 2017-08-08 DIAGNOSIS — Z7984 Long term (current) use of oral hypoglycemic drugs: Secondary | ICD-10-CM | POA: Insufficient documentation

## 2017-08-08 DIAGNOSIS — J029 Acute pharyngitis, unspecified: Secondary | ICD-10-CM | POA: Diagnosis present

## 2017-08-08 DIAGNOSIS — Z79899 Other long term (current) drug therapy: Secondary | ICD-10-CM | POA: Diagnosis not present

## 2017-08-08 DIAGNOSIS — B9789 Other viral agents as the cause of diseases classified elsewhere: Secondary | ICD-10-CM

## 2017-08-08 LAB — CULTURE, GROUP A STREP (THRC)

## 2017-08-08 MED ORDER — GUAIFENESIN ER 1200 MG PO TB12: 1200.0000 mg | ORAL_TABLET | Freq: Two times a day (BID) | ORAL | 0 refills | Status: DC

## 2017-08-08 MED ORDER — BENZONATATE 100 MG PO CAPS: 100.0000 mg | ORAL_CAPSULE | Freq: Three times a day (TID) | ORAL | 0 refills | Status: DC

## 2017-08-08 MED ORDER — FLUTICASONE PROPIONATE 50 MCG/ACT NA SUSP: 2.0000 | Freq: Every day | NASAL | 0 refills | Status: DC

## 2017-08-08 NOTE — ED Triage Notes (Signed)
Patient states she has a four day history of fever up to 102 and sore throat.  States she has a non productive cough.

## 2017-08-08 NOTE — Discharge Instructions (Signed)
Please read and follow all provided instructions.  Your diagnoses today include:  1. Viral URI with cough   2. Laryngitis     Tests performed today include: Vital signs. See below for your results today.   Medications prescribed/advised:  1. Musinex [Guaifenesin] as a decongestant [thin mucus - you have to be well hydrated when taking this for it to work],  2. Tylenol for fever/pain and Motrin/Ibuprofen for muscle aches 3. Flonase Steroid Nasal Spray. This does not work to maximum capability unless used daily >1-2 weeks.  4. Cough Suppressant: Take as directed.    Home care instructions:  An upper respiratory infection (URI) is also sometimes known as the common cold. Most people improve within 1 week, but symptoms can last up to 2 weeks. A residual cough may last even longer.   URI is most commonly caused by a virus. Viruses are NOT treated with antibiotics. You can easily spread the virus to others by oral contact. This includes kissing, sharing a glass, coughing, or sneezing. Touching your mouth or nose and then touching a surface, which is then touched by another person, can also spread the virus.   TREATMENT  Treatment is directed at relieving symptoms. There is no cure. Antibiotics are not effective, because the infection is caused by a virus, not by bacteria. Treatment may include:  Increased fluid intake. Sports drinks offer valuable electrolytes, sugars, and fluids.  Breathing heated mist or steam (vaporizer or shower).  Eating chicken soup or other clear broths, and maintaining good nutrition.  Getting plenty of rest.  Using gargles or lozenges for comfort.  Controlling fevers with ibuprofen or acetaminophen as directed by your caregiver.  Increasing usage of your inhaler if you have asthma.  Return to work when your temperature has returned to normal.   Follow-up instructions: Followup with your primary care doctor in 4 days if your symptoms persist.  Your more than  welcome to return to the emergency department if symptoms worsen or become concerning.  Return instructions:  Please return to the Emergency Department if you do not get better, if you get worse, or new symptoms OR  - Fever (temperature greater than 101.5F)  - Bleeding that does not stop with holding pressure to the area    -Severe pain (please note that you may be more sore the day after your accident)  - Chest Pain  - Difficulty breathing (worsening shortness of breath with sputum production may  be a sign of pneumonia.   - Severe nausea or vomiting  - Inability to tolerate food and liquids  - Passing out  - Skin becoming red around your wounds  - Change in mental status (confusion or lethargy)  - New numbness or weakness     -You develop fever, swollen neck glands, pain with swallowing or white areas on  the back of your throat. This may be a sign of strep throat.  Please return if you have any other emergent concerns.  Additional Information:  Your vital signs today were: BP (!) 100/59    Pulse 86    Temp 99.4 F (37.4 C) (Oral)    Resp (!) 22    Ht 5\' 4"  (1.626 m)    Wt 77.1 kg (170 lb)    SpO2 100%    BMI 29.18 kg/m  If your blood pressure (BP) was elevated above 135/85 this visit, please have this repeated by your doctor within one month.

## 2017-08-08 NOTE — ED Notes (Signed)
Patient refused to have throat swap.

## 2017-08-08 NOTE — ED Provider Notes (Signed)
[facility] EMERGENCY DEPARTMENT Provider Note   CSN: [id number] Arrival date & time: 08/08/17  1803     History   Chief Complaint Chief Complaint  Patient presents with  . Sore Throat    HPI Erin Craig is a 56 y.o. female with a history of diabetes who presents the emerge department today for continued URI symptoms.  Patient was here on 08/06/2017 for sore throat where she had a negative strep test done.  She states that over the last 4-5 days she has been having nasal congestion, rhinorrhea, postnasal drip, scratchy sore throat, nonproductive cough and intermittent fevers with chills and body aches..  She notes her T-max was 101 yesterday.  She states that over the holidays she had 3 grandchildren with URI symptoms which she was exposed to.  Patient has not been taking anything for her symptoms prior to arrival.  She denies any chest pain, shortness of breath or recent travel. She has obvious laryngitis.   HPI  Past Medical History:  Diagnosis Date  . Bradycardia   . Diabetes mellitus without complication (HCC)   . Heart murmur     There are no active problems to display for this patient.   Past Surgical History:  Procedure Laterality Date  . ABDOMINAL HYSTERECTOMY    . CESAREAN SECTION    . CHOLECYSTECTOMY    . FOOT SURGERY      OB History    No data available       Home Medications    Prior to Admission medications   Medication Sig Start Date End Date Taking? Authorizing Provider  acetaminophen (TYLENOL) 325 MG tablet Take 325 mg by mouth every 6 (six) hours as needed. Headache/pain   Yes [provider]  atorvastatin (LIPITOR) 10 MG tablet Take 10 mg by mouth daily.   Yes [provider]  glipiZIDE (GLUCOTROL) 5 MG tablet Take 10 mg by mouth daily before breakfast.   Yes [provider]  lisinopril (PRINIVIL,ZESTRIL) 5 MG tablet Take 5 mg by mouth daily.   Yes [provider]  metFORMIN (GLUCOPHAGE) 1000 MG  tablet Take 1,000 mg by mouth 1 day or 1 dose.   Yes [provider]  cephALEXin (KEFLEX) 500 MG capsule Take 1 capsule (500 mg total) by mouth 2 (two) times daily. 11/20/16   Law, Waylan BogaAlexandra M, PA-C  ciprofloxacin (CIPRO) 500 MG tablet Take 500 mg by mouth 2 (two) times daily.    [provider]  esomeprazole (NEXIUM) 20 MG capsule Take 20 mg by mouth daily before breakfast.      [provider]  guaiFENesin-codeine 100-10 MG/5ML syrup Take 5 mLs by mouth every 4 (four) hours as needed for cough. orally every 4 hours; MAX 6 doses/24 hours 08/20/16   Leaphart, Iantha FallenKenneth T, PA-C  HYDROcodone-homatropine (HYCODAN) 5-1.5 MG/5ML syrup Take 5 mLs by mouth every 6 (six) hours as needed for cough. 07/27/16   Gilda CreasePollina, Christopher J, MD  lidocaine (XYLOCAINE) 2 % solution Use as directed 20 mLs in the mouth or throat as needed for mouth pain. 08/06/17   Rise MuLeaphart, Kenneth T, PA-C  naproxen (NAPROSYN) 500 MG tablet Take 1 tablet (500 mg total) by mouth 2 (two) times daily. 02/17/16   Dione BoozeGlick, David, MD  phenazopyridine (PYRIDIUM) 100 MG tablet Take 100 mg by mouth 3 (three) times daily as needed for pain.    [provider]  traMADol (ULTRAM) 50 MG tablet Take 1 tablet (50 mg total) by mouth every 6 (six)  hours as needed. 02/17/16   Dione BoozeGlick, David, MD    Family History Family History  Problem Relation Age of Onset  . Alzheimer's disease Unknown   . Coronary artery disease Unknown   . Breast cancer Unknown   . Arthritis Unknown   . Hypertension Unknown   . Diabetes Unknown     Social History Social History   Tobacco Use  . Smoking status: Never Smoker  . Smokeless tobacco: Never Used  Substance Use Topics  . Alcohol use: No  . Drug use: No     Allergies   Patient has no known allergies.   Review of Systems Review of Systems  All other systems reviewed and are negative.    Physical Exam Updated Vital Signs BP (!) 100/59   Pulse 86   Temp 99.4 F (37.4 C)  (Oral)   Resp (!) 22   Ht 5\' 4"  (1.626 m)   Wt 77.1 kg (170 lb)   SpO2 100%   BMI 29.18 kg/m   Physical Exam  Constitutional: She appears well-developed and well-nourished.  HENT:  Head: Normocephalic and atraumatic.  Right Ear: Hearing, tympanic membrane, external ear and ear canal normal.  Left Ear: Hearing, tympanic membrane, external ear and ear canal normal.  Nose: Mucosal edema and rhinorrhea present. Right sinus exhibits no maxillary sinus tenderness and no frontal sinus tenderness. Left sinus exhibits no maxillary sinus tenderness and no frontal sinus tenderness.  Mouth/Throat: Uvula is midline, oropharynx is clear and moist and mucous membranes are normal. No tonsillar exudate.  The patient has obvious laryngitis. She is in control of secretions. No stridor.  Midline uvula without edema. Soft palate rises symmetrically. There is tonsillar erythema without exudates. No PTA. Tongue protrusion is normal. No trismus. No creptius on neck palpation and patient has good dentition. No gingival erythema or fluctuance noted. Mucus membranes moist.   Eyes: Pupils are equal, round, and reactive to light. Right eye exhibits no discharge. Left eye exhibits no discharge. No scleral icterus.  Neck: Trachea normal. Neck supple. No spinous process tenderness present. No neck rigidity. Normal range of motion present.  No meningismus  Cardiovascular: Normal rate, regular rhythm and intact distal pulses.  No murmur heard. Pulses:      Radial pulses are 2+ on the right side, and 2+ on the left side.       Dorsalis pedis pulses are 2+ on the right side, and 2+ on the left side.       Posterior tibial pulses are 2+ on the right side, and 2+ on the left side.  No lower extremity swelling or edema. Calves symmetric in size bilaterally.  Pulmonary/Chest: Effort normal and breath sounds normal. She exhibits no tenderness.  Abdominal: Soft. Bowel sounds are normal. There is no tenderness. There is no rebound  and no guarding.  Musculoskeletal: She exhibits no edema.  Lymphadenopathy:    She has no cervical adenopathy.  Neurological: She is alert.  Skin: Skin is warm and dry. Capillary refill takes less than 2 seconds. No rash noted. She is not diaphoretic.  Psychiatric: She has a normal mood and affect.  Nursing note and vitals reviewed.   ED Treatments / Results  Labs (all labs ordered are listed, but only abnormal results are displayed) Labs Reviewed - No data to display  EKG  EKG Interpretation None       Radiology Dg Chest 2 View  Result Date: 08/08/2017 CLINICAL DATA:  Patient complains of sore throat, fever, cough and  congestion x several days EXAM: CHEST  2 VIEW COMPARISON:  08/20/2016 FINDINGS: Slight linear atelectasis in the lung bases. Normal heart size and pulmonary vascularity. No focal airspace disease or consolidation in the lungs. No blunting of costophrenic angles. No pneumothorax. Mediastinal contours appear intact. Surgical clips in the upper abdomen. IMPRESSION: No active cardiopulmonary disease. Electronically Signed   By: Burman Nieves M.D.   On: 08/08/2017 19:10    Procedures Procedures (including critical care time)  Medications Ordered in ED Medications - No data to display   Initial Impression / Assessment and Plan / ED Course  I have reviewed the triage vital signs and the nursing notes.  Pertinent labs & imaging results that were available during my care of the patient were reviewed by me and considered in my medical decision making (see chart for details).     56 year old female presenting with 4-5 days of nasal congestion, rhinorrhea, postnasal drip, scratchy sore throat, nonproductive cough and intermittent fevers with chills and body aches.  She was seen here on 12/28 with a negative strep test.  She is presenting for continued symptoms.  She has not been taking anything for this.  She does have sick contacts over the holidays with  grandchildren with URI symptoms.  Not complaining of any chest pain or shortness of breath.  Her vital signs without any fever, tachycardia, tachypnea, hypoxia or hypotension.  She is non-ill and nonseptic appearing.  On exam the patient does have some tonsillar erythema without exudates.  There is no evidence of PTA.  Do not suspect RPA at this time.  She is without any neck stiffness. Lungs are CTA b/l. Pt CXR negative for acute infiltrate. Patients symptoms are consistent with URI, likely viral etiology. Discussed that antibiotics are not indicated for viral infections. Pt will be discharged with symptomatic treatment.  Verbalizes understanding and is agreeable with plan. Pt is hemodynamically stable & in NAD prior to dc.   Final Clinical Impressions(s) / ED Diagnoses   Final diagnoses:  Viral URI with cough  Laryngitis    ED Discharge Orders        Ordered    fluticasone (FLONASE) 50 MCG/ACT nasal spray  Daily     08/08/17 2201    benzonatate (TESSALON) 100 MG capsule  Every 8 hours     08/08/17 2201    Guaifenesin (MUCINEX MAXIMUM STRENGTH) 1200 MG TB12  2 times daily     08/08/17 2201       Princella Pellegrini 08/09/17 0138    Linwood Dibbles, MD 08/11/17 [phone]

## 2017-08-13 ENCOUNTER — Other Ambulatory Visit: Payer: Self-pay

## 2017-08-13 ENCOUNTER — Encounter (HOSPITAL_BASED_OUTPATIENT_CLINIC_OR_DEPARTMENT_OTHER): Payer: Self-pay

## 2017-08-13 ENCOUNTER — Emergency Department (HOSPITAL_BASED_OUTPATIENT_CLINIC_OR_DEPARTMENT_OTHER)
Admission: EM | Admit: 2017-08-13 | Discharge: 2017-08-13 | Disposition: A | Discharge status: Home / Self Care | Payer: No Typology Code available for payment source | Attending: Emergency Medicine | Admitting: Emergency Medicine

## 2017-08-13 DIAGNOSIS — R059 Cough, unspecified: Secondary | ICD-10-CM

## 2017-08-13 DIAGNOSIS — E119 Type 2 diabetes mellitus without complications: Secondary | ICD-10-CM | POA: Insufficient documentation

## 2017-08-13 DIAGNOSIS — R21 Rash and other nonspecific skin eruption: Secondary | ICD-10-CM | POA: Insufficient documentation

## 2017-08-13 DIAGNOSIS — R05 Cough: Secondary | ICD-10-CM | POA: Insufficient documentation

## 2017-08-13 DIAGNOSIS — Z79899 Other long term (current) drug therapy: Secondary | ICD-10-CM | POA: Insufficient documentation

## 2017-08-13 MED ORDER — HYDROCOD POLST-CPM POLST ER 10-8 MG/5ML PO SUER: 5.0000 mL | Freq: Two times a day (BID) | ORAL | 0 refills | Status: DC | PRN

## 2017-08-13 MED ORDER — OXYMETAZOLINE HCL 0.05 % NA SOLN
2.0000 | Freq: Two times a day (BID) | NASAL | Status: DC | PRN
Start: 2017-08-13 — End: 2017-08-13
  Administered 2017-08-13: 2 via NASAL
  Filled 2017-08-13: qty 15

## 2017-08-13 MED ORDER — HYDROCORTISONE 1 % EX CREA
TOPICAL_CREAM | Freq: Two times a day (BID) | CUTANEOUS | Status: DC
  Administered 2017-08-13: 03:00:00 via TOPICAL
  Filled 2017-08-13: qty 28

## 2017-08-13 NOTE — ED Triage Notes (Signed)
Pt reports nasal congestion and cough, denies fevers. Pt seen here for same. Pt reports taking meds as prescribed.

## 2017-08-13 NOTE — ED Notes (Signed)
Pt verbalizes understanding of d/c instructions and denies any further needs at this time. 

## 2017-08-13 NOTE — ED Provider Notes (Addendum)
[facility] Provider Note: [name], MD, FACEP  CSN: [id number] MRN: [id number] ARRIVAL: 08/13/17 at 0234 ROOM: MH10/MH10   CHIEF COMPLAINT  URI   HISTORY OF PRESENT ILLNESS  08/13/17 3:07 AM Erin Craig is a 57 y.o. female with about a 1 week history of an upper respiratory infection.  This is her third visit to the ED.  Most of her symptoms have resolved but she continues to have a persistent non-productive cough.  She is no longer having fevers, chills, body aches or sore throat.  She continues to have nasal congestion.  She denies shortness of breath, wheezing, vomiting or diarrhea.  She does have a rash to her anterior neck which is itchy but not severe; this rash is maculopapular.  She is not aware of anything that may have contacted her neck to cause this.  Consultation with the [facility] state controlled substances database reveals the patient has received 1 prescription for hydrocodone in the past year..   Past Medical History:  Diagnosis Date  . Bradycardia   . Diabetes mellitus without complication (HCC)   . Heart murmur     Past Surgical History:  Procedure Laterality Date  . ABDOMINAL HYSTERECTOMY    . CESAREAN SECTION    . CHOLECYSTECTOMY    . FOOT SURGERY      Family History  Problem Relation Age of Onset  . Alzheimer's disease Unknown   . Coronary artery disease Unknown   . Breast cancer Unknown   . Arthritis Unknown   . Hypertension Unknown   . Diabetes Unknown     Social History   Tobacco Use  . Smoking status: Never Smoker  . Smokeless tobacco: Never Used  Substance Use Topics  . Alcohol use: No  . Drug use: No    Prior to Admission medications   Medication Sig Start Date End Date Taking? Authorizing Provider  acetaminophen (TYLENOL) 325 MG tablet Take 325 mg by mouth every 6 (six) hours as needed. Headache/pain   Yes [provider]  atorvastatin (LIPITOR) 10 MG tablet Take 10 mg by mouth daily.   Yes  [provider]  benzonatate (TESSALON) 100 MG capsule Take 1 capsule (100 mg total) by mouth every 8 (eight) hours. 08/08/17  Yes Maczis, Elmer Sow, PA-C  fluticasone (FLONASE) 50 MCG/ACT nasal spray Place 2 sprays into both nostrils daily. 08/08/17  Yes Maczis, Elmer Sow, PA-C  glipiZIDE (GLUCOTROL) 5 MG tablet Take 10 mg by mouth daily before breakfast.   Yes [provider]  Guaifenesin (MUCINEX MAXIMUM STRENGTH) 1200 MG TB12 Take 1 tablet (1,200 mg total) by mouth 2 (two) times daily. 08/08/17  Yes Maczis, Elmer Sow, PA-C  lisinopril (PRINIVIL,ZESTRIL) 5 MG tablet Take 5 mg by mouth daily.   Yes [provider]  metFORMIN (GLUCOPHAGE) 1000 MG tablet Take 1,000 mg by mouth 1 day or 1 dose.   Yes [provider]  chlorpheniramine-HYDROcodone (TUSSIONEX PENNKINETIC ER) 10-8 MG/5ML SUER Take 5 mLs by mouth every 12 (twelve) hours as needed. 08/13/17   Kain Milosevic, MD  esomeprazole (NEXIUM) 20 MG capsule Take 20 mg by mouth daily before breakfast.      [provider]  lidocaine (XYLOCAINE) 2 % solution Use as directed 20 mLs in the mouth or throat as needed for mouth pain. 08/06/17   Rise Mu, PA-C    Allergies Patient has no known allergies.   REVIEW OF SYSTEMS  Negative except as noted here or in the  History of Present Illness.   PHYSICAL EXAMINATION  Initial Vital Signs Blood pressure 119/67, pulse 65, temperature 98.1 F (36.7 C), temperature source Oral, resp. rate 18, height 5\' 4"  (1.626 m), weight 77.1 kg (170 lb), SpO2 99 %.  Examination General: Well-developed, well-nourished female in no acute distress; appearance consistent with age of record HENT: normocephalic; atraumatic; nasal congestion; pharynx normal Eyes: pupils equal, round and reactive to light; extraocular muscles intact Neck: supple Heart: regular rate and rhythm Lungs: clear to auscultation bilaterally Abdomen: soft; nondistended; nontender; bowel sounds  present Extremities: No deformity; full range of motion; pulses normal Neurologic: Awake, alert and oriented; motor function intact in all extremities and symmetric; no facial droop Skin: Warm and dry; mildly erythematous maculopapular rash of anterior neck and upper chest above the neckline of her shirt Psychiatric: Normal mood and affect   RESULTS  Summary of this visit's results, reviewed by myself:   EKG Interpretation  Date/Time:    Ventricular Rate:    PR Interval:    QRS Duration:   QT Interval:    QTC Calculation:   R Axis:     Text Interpretation:        Laboratory Studies: No results found for this or any previous visit (from the past 24 hour(s)). Imaging Studies: No results found.  ED COURSE  Nursing notes and initial vitals signs, including pulse oximetry, reviewed.  Vitals:   08/13/17 0242 08/13/17 0243  BP: 119/67   Pulse: 65   Resp: 18   Temp: 98.1 F (36.7 C)   TempSrc: Oral   SpO2: 99%   Weight:  77.1 kg (170 lb)  Height:  5\' 4"  (1.626 m)    PROCEDURES    ED DIAGNOSES     ICD-10-CM   1. Cough R05   2. Rash of neck R21        Suprina Mandeville, MD 08/13/17 0321    Paula LibraMolpus, Parveen Freehling, MD 08/13/17 [phone]

## 2017-08-22 ENCOUNTER — Encounter (HOSPITAL_BASED_OUTPATIENT_CLINIC_OR_DEPARTMENT_OTHER): Payer: Self-pay | Admitting: *Deleted

## 2017-08-22 ENCOUNTER — Emergency Department (HOSPITAL_BASED_OUTPATIENT_CLINIC_OR_DEPARTMENT_OTHER)
Admission: EM | Admit: 2017-08-22 | Discharge: 2017-08-22 | Disposition: A | Discharge status: Home / Self Care | Payer: PRIVATE HEALTH INSURANCE | Attending: Emergency Medicine | Admitting: Emergency Medicine

## 2017-08-22 ENCOUNTER — Other Ambulatory Visit: Payer: Self-pay

## 2017-08-22 DIAGNOSIS — R053 Chronic cough: Secondary | ICD-10-CM

## 2017-08-22 DIAGNOSIS — E119 Type 2 diabetes mellitus without complications: Secondary | ICD-10-CM | POA: Insufficient documentation

## 2017-08-22 DIAGNOSIS — Z79899 Other long term (current) drug therapy: Secondary | ICD-10-CM | POA: Diagnosis not present

## 2017-08-22 DIAGNOSIS — R350 Frequency of micturition: Secondary | ICD-10-CM | POA: Diagnosis not present

## 2017-08-22 DIAGNOSIS — N10 Acute tubulo-interstitial nephritis: Secondary | ICD-10-CM | POA: Diagnosis not present

## 2017-08-22 DIAGNOSIS — Z7984 Long term (current) use of oral hypoglycemic drugs: Secondary | ICD-10-CM | POA: Insufficient documentation

## 2017-08-22 DIAGNOSIS — R300 Dysuria: Secondary | ICD-10-CM | POA: Diagnosis not present

## 2017-08-22 DIAGNOSIS — I10 Essential (primary) hypertension: Secondary | ICD-10-CM | POA: Insufficient documentation

## 2017-08-22 DIAGNOSIS — R05 Cough: Secondary | ICD-10-CM | POA: Insufficient documentation

## 2017-08-22 DIAGNOSIS — R109 Unspecified abdominal pain: Secondary | ICD-10-CM | POA: Diagnosis present

## 2017-08-22 HISTORY — DX: Essential (primary) hypertension: I10

## 2017-08-22 HISTORY — DX: Pure hypercholesterolemia, unspecified: E78.00

## 2017-08-22 LAB — URINALYSIS, MICROSCOPIC (REFLEX)

## 2017-08-22 LAB — URINALYSIS, ROUTINE W REFLEX MICROSCOPIC
Bilirubin Urine: NEGATIVE
Glucose, UA: NEGATIVE mg/dL
Ketones, ur: NEGATIVE mg/dL
Nitrite: NEGATIVE
Protein, ur: NEGATIVE mg/dL
Specific Gravity, Urine: >1.03 — ABNORMAL HIGH (ref 1.005–1.030)
pH: 5.5 (ref 5.0–8.0)

## 2017-08-22 MED ORDER — CEPHALEXIN 500 MG PO CAPS: 500.0000 mg | ORAL_CAPSULE | Freq: Four times a day (QID) | ORAL | 0 refills | Status: DC

## 2017-08-22 MED ORDER — CEPHALEXIN 250 MG PO CAPS
1000.0000 mg | ORAL_CAPSULE | Freq: Once | ORAL | Status: AC
  Administered 2017-08-22: 1000 mg via ORAL
  Filled 2017-08-22: qty 4

## 2017-08-22 MED ORDER — ALBUTEROL SULFATE HFA 108 (90 BASE) MCG/ACT IN AERS
2.0000 | INHALATION_SPRAY | RESPIRATORY_TRACT | Status: DC | PRN
  Administered 2017-08-22: 2 via RESPIRATORY_TRACT
  Filled 2017-08-22: qty 6.7

## 2017-08-22 NOTE — ED Triage Notes (Signed)
C/o UTI sx

## 2017-08-22 NOTE — ED Provider Notes (Signed)
[facility] Provider Note: Erin Dell, MD, FACEP  CSN: [id number] MRN: [id number] ARRIVAL: 08/22/17 at 0334 ROOM: MH05/MH05   CHIEF COMPLAINT  Back Pain   HISTORY OF PRESENT ILLNESS  08/22/17 3:54 AM Erin Craig is a 57 y.o. female the 2-day history of urinary symptoms.  Specifically she is having left flank pain which she rates as a 6 out of 10, worse with movement.  She is also having urinary frequency and mild discomfort with urination.  She denies fever, chills, nausea or vomiting.  She states symptoms are consistent with previous urinary tract infections.  She is also having a persistent nonproductive cough which has been lingering since her respiratory illness couple weeks ago.  The rash on her anterior neck has cleared with topical hydrocortisone cream.   Past Medical History:  Diagnosis Date  . Bradycardia   . Diabetes mellitus without complication (HCC)   . Heart murmur   . Hypercholesterolemia   . Hypertension     Past Surgical History:  Procedure Laterality Date  . ABDOMINAL HYSTERECTOMY    . CESAREAN SECTION    . CHOLECYSTECTOMY    . FOOT SURGERY      Family History  Problem Relation Age of Onset  . Alzheimer's disease Unknown   . Coronary artery disease Unknown   . Breast cancer Unknown   . Arthritis Unknown   . Hypertension Unknown   . Diabetes Unknown     Social History   Tobacco Use  . Smoking status: Never Smoker  . Smokeless tobacco: Never Used  Substance Use Topics  . Alcohol use: No  . Drug use: No    Prior to Admission medications   Medication Sig Start Date End Date Taking? Authorizing Provider  acetaminophen (TYLENOL) 325 MG tablet Take 325 mg by mouth every 6 (six) hours as needed. Headache/pain    [provider]  atorvastatin (LIPITOR) 10 MG tablet Take 10 mg by mouth daily.    [provider]  benzonatate (TESSALON) 100 MG capsule Take 1 capsule (100 mg total) by mouth every 8 (eight) hours.  08/08/17   Maczis, Elmer Sow, PA-C  chlorpheniramine-HYDROcodone (TUSSIONEX PENNKINETIC ER) 10-8 MG/5ML SUER Take 5 mLs by mouth every 12 (twelve) hours as needed for cough. 08/13/17   Grigor Lipschutz, MD  esomeprazole (NEXIUM) 20 MG capsule Take 20 mg by mouth daily before breakfast.      [provider]  fluticasone (FLONASE) 50 MCG/ACT nasal spray Place 2 sprays into both nostrils daily. 08/08/17   Maczis, Elmer Sow, PA-C  glipiZIDE (GLUCOTROL) 5 MG tablet Take 10 mg by mouth daily before breakfast.    [provider]  Guaifenesin (MUCINEX MAXIMUM STRENGTH) 1200 MG TB12 Take 1 tablet (1,200 mg total) by mouth 2 (two) times daily. 08/08/17   Maczis, Elmer Sow, PA-C  lidocaine (XYLOCAINE) 2 % solution Use as directed 20 mLs in the mouth or throat as needed for mouth pain. 08/06/17   Rise Mu, PA-C  lisinopril (PRINIVIL,ZESTRIL) 5 MG tablet Take 5 mg by mouth daily.    [provider]  metFORMIN (GLUCOPHAGE) 1000 MG tablet Take 1,000 mg by mouth 1 day or 1 dose.    [provider]    Allergies Patient has no known allergies.   REVIEW OF SYSTEMS  Negative except as noted here or in the History of Present Illness.   PHYSICAL EXAMINATION  Initial Vital Signs Blood pressure 110/66, pulse 60, temperature 99 F (37.2 C), temperature source  Oral, resp. rate 18, SpO2 98 %.  Examination General: Well-developed, well-nourished female in no acute distress; appearance consistent with age of record HENT: normocephalic; atraumatic Eyes: pupils equal, round and reactive to light; extraocular muscles intact Neck: supple Heart: regular rate and rhythm Lungs: clear to auscultation bilaterally Abdomen: soft; nondistended; nontender; bowel sounds present GU: Left CVA tenderness Extremities: No deformity; full range of motion; pulses normal Neurologic: Awake, alert and oriented; motor function intact in all extremities and symmetric; no facial droop Skin: Warm  and dry Psychiatric: Normal mood and affect   RESULTS  Summary of this visit's results, reviewed by myself:   EKG Interpretation  Date/Time:    Ventricular Rate:    PR Interval:    QRS Duration:   QT Interval:    QTC Calculation:   R Axis:     Text Interpretation:        Laboratory Studies: Results for orders placed or performed during the hospital encounter of 08/22/17 (from the past 24 hour(s))  Urinalysis, Routine w reflex microscopic     Status: Abnormal   Collection Time: 08/22/17  3:35 AM  Result Value Ref Range   Color, Urine YELLOW YELLOW   APPearance HAZY (A) CLEAR   Specific Gravity, Urine >1.030 (H) 1.005 - 1.030   pH 5.5 5.0 - 8.0   Glucose, UA NEGATIVE NEGATIVE mg/dL   Hgb urine dipstick SMALL (A) NEGATIVE   Bilirubin Urine NEGATIVE NEGATIVE   Ketones, ur NEGATIVE NEGATIVE mg/dL   Protein, ur NEGATIVE NEGATIVE mg/dL   Nitrite NEGATIVE NEGATIVE   Leukocytes, UA SMALL (A) NEGATIVE  Urinalysis, Microscopic (reflex)     Status: Abnormal   Collection Time: 08/22/17  3:35 AM  Result Value Ref Range   RBC / HPF 0-5 0 - 5 RBC/hpf   WBC, UA 6-30 0 - 5 WBC/hpf   Bacteria, UA MANY (A) NONE SEEN   Squamous Epithelial / LPF 0-5 (A) NONE SEEN   Mucus PRESENT    Ca Oxalate Crys, UA PRESENT    Imaging Studies: No results found.  ED COURSE  Nursing notes and initial vitals signs, including pulse oximetry, reviewed.  Vitals:   08/22/17 0347  BP: 110/66  Pulse: 60  Resp: 18  Temp: 99 F (37.2 C)  TempSrc: Oral  SpO2: 98%   We will treat for early pyelonephritis given patient's flank pain and tenderness.  PROCEDURES    ED DIAGNOSES     ICD-10-CM   1. Acute pyelonephritis N10   2. Persistent cough R05        Makarios Madlock, Jonny RuizJohn, MD 08/22/17 [id number]

## 2017-08-22 NOTE — ED Notes (Addendum)
C/o L lower back pain, also frequency, urgency, dysuria, change in stream, inability to empty bladder, sx onset yesterday,  (denies: fever, bleeding, NVD, dizziness, leg sx, vaginal d/c, itching, rash, loss of control of bowel or bladder), no meds PTA. H/o UTI and DM. PT of Dr. Arma HeadingBuska.  Alert, NAD, calm, interactive, resps e/u, speaking in clear complete sentences, no dyspnea noted, skin W&D, VSS. Steady on feet/gait. Pending urine results.

## 2017-08-22 NOTE — ED Notes (Signed)
Patient given instruction on use of albuterol mdi.  Patient with good understanding and return demonstration of proper technique.

## 2017-08-22 NOTE — ED Notes (Signed)
EDP into room 

## 2017-08-23 LAB — URINE CULTURE: Culture: <10000 — AB

## 2017-08-31 ENCOUNTER — Emergency Department (HOSPITAL_BASED_OUTPATIENT_CLINIC_OR_DEPARTMENT_OTHER)
Admission: EM | Admit: 2017-08-31 | Discharge: 2017-08-31 | Disposition: A | Discharge status: Home / Self Care | Payer: No Typology Code available for payment source | Attending: Emergency Medicine | Admitting: Emergency Medicine

## 2017-08-31 ENCOUNTER — Encounter (HOSPITAL_BASED_OUTPATIENT_CLINIC_OR_DEPARTMENT_OTHER): Payer: Self-pay

## 2017-08-31 ENCOUNTER — Other Ambulatory Visit: Payer: Self-pay

## 2017-08-31 DIAGNOSIS — Z79899 Other long term (current) drug therapy: Secondary | ICD-10-CM | POA: Diagnosis not present

## 2017-08-31 DIAGNOSIS — Z9049 Acquired absence of other specified parts of digestive tract: Secondary | ICD-10-CM | POA: Insufficient documentation

## 2017-08-31 DIAGNOSIS — I10 Essential (primary) hypertension: Secondary | ICD-10-CM | POA: Insufficient documentation

## 2017-08-31 DIAGNOSIS — E119 Type 2 diabetes mellitus without complications: Secondary | ICD-10-CM | POA: Diagnosis not present

## 2017-08-31 DIAGNOSIS — Z7984 Long term (current) use of oral hypoglycemic drugs: Secondary | ICD-10-CM | POA: Insufficient documentation

## 2017-08-31 DIAGNOSIS — R350 Frequency of micturition: Secondary | ICD-10-CM | POA: Diagnosis present

## 2017-08-31 DIAGNOSIS — R059 Cough, unspecified: Secondary | ICD-10-CM

## 2017-08-31 DIAGNOSIS — R05 Cough: Secondary | ICD-10-CM | POA: Insufficient documentation

## 2017-08-31 DIAGNOSIS — M545 Low back pain: Secondary | ICD-10-CM | POA: Insufficient documentation

## 2017-08-31 DIAGNOSIS — M7918 Myalgia, other site: Secondary | ICD-10-CM

## 2017-08-31 LAB — URINALYSIS, ROUTINE W REFLEX MICROSCOPIC
Bilirubin Urine: NEGATIVE
Glucose, UA: NEGATIVE mg/dL
Ketones, ur: NEGATIVE mg/dL
Leukocytes, UA: NEGATIVE
Nitrite: NEGATIVE
Protein, ur: NEGATIVE mg/dL
Specific Gravity, Urine: 1.015 (ref 1.005–1.030)
pH: 5.5 (ref 5.0–8.0)

## 2017-08-31 LAB — URINALYSIS, MICROSCOPIC (REFLEX)
Bacteria, UA: NONE SEEN
WBC, UA: NONE SEEN WBC/hpf (ref 0–5)

## 2017-08-31 LAB — CBG MONITORING, ED: Glucose-Capillary: 97 mg/dL (ref 65–99)

## 2017-08-31 MED ORDER — DEXAMETHASONE SODIUM PHOSPHATE 10 MG/ML IJ SOLN
10.0000 mg | Freq: Once | INTRAMUSCULAR | Status: AC
  Administered 2017-08-31: 10 mg via INTRAMUSCULAR
  Filled 2017-08-31: qty 1

## 2017-08-31 NOTE — ED Triage Notes (Signed)
Pt c/o cont'd urinary freq-states she was seen here for same and no better-NAD-steady gait

## 2017-08-31 NOTE — ED Provider Notes (Signed)
[facility] EMERGENCY DEPARTMENT Provider Note   CSN: [id number] Arrival date & time: 08/31/17  1222     History   Chief Complaint Chief Complaint  Patient presents with  . Urinary Frequency    HPI Erin Craig is a 57 y.o. female who has been to the emergency department with recurrent complaint of URI symptoms, frequent urination and left flank pain.  Patient developed a cold about 1 month ago and has a persistent cough.  Her symptoms have improved in terms of the initial URI however she continues to have cough which is worse at night.  She states that no over-the-counter medications have worked well for her and her at her last visit she was given Tessalon which did not pain.  She states that she is urinating frequently but also admits to drinking copious amounts of water.  She complains of pain in her left lower back which is worse with standing.  It does not seem to be associated with urination.  She denies foul odor of urine, hematuria or other urinary symptoms.  She denies that her blood sugar has been elevated. HPI  Past Medical History:  Diagnosis Date  . Bradycardia   . Diabetes mellitus without complication (HCC)   . Heart murmur   . Hypercholesterolemia   . Hypertension     There are no active problems to display for this patient.   Past Surgical History:  Procedure Laterality Date  . ABDOMINAL HYSTERECTOMY    . CESAREAN SECTION    . CHOLECYSTECTOMY    . FOOT SURGERY      OB History    No data available       Home Medications    Prior to Admission medications   Medication Sig Start Date End Date Taking? Authorizing Provider  acetaminophen (TYLENOL) 325 MG tablet Take 325 mg by mouth every 6 (six) hours as needed. Headache/pain    [provider]  atorvastatin (LIPITOR) 10 MG tablet Take 10 mg by mouth daily.    [provider]  benzonatate (TESSALON) 100 MG capsule Take 1 capsule (100 mg total) by mouth every 8 (eight) hours.  08/08/17   Maczis, Elmer Sow, PA-C  cephALEXin (KEFLEX) 500 MG capsule Take 1 capsule (500 mg total) by mouth 4 (four) times daily. 08/22/17   Molpus, John, MD  chlorpheniramine-HYDROcodone (TUSSIONEX PENNKINETIC ER) 10-8 MG/5ML SUER Take 5 mLs by mouth every 12 (twelve) hours as needed for cough. 08/13/17   Molpus, John, MD  esomeprazole (NEXIUM) 20 MG capsule Take 20 mg by mouth daily before breakfast.      [provider]  fluticasone (FLONASE) 50 MCG/ACT nasal spray Place 2 sprays into both nostrils daily. 08/08/17   Maczis, Elmer Sow, PA-C  glipiZIDE (GLUCOTROL) 5 MG tablet Take 10 mg by mouth daily before breakfast.    [provider]  Guaifenesin (MUCINEX MAXIMUM STRENGTH) 1200 MG TB12 Take 1 tablet (1,200 mg total) by mouth 2 (two) times daily. 08/08/17   Maczis, Elmer Sow, PA-C  lidocaine (XYLOCAINE) 2 % solution Use as directed 20 mLs in the mouth or throat as needed for mouth pain. 08/06/17   Rise Mu, PA-C  lisinopril (PRINIVIL,ZESTRIL) 5 MG tablet Take 5 mg by mouth daily.    [provider]  metFORMIN (GLUCOPHAGE) 1000 MG tablet Take 1,000 mg by mouth 1 day or 1 dose.    [provider]    Family History Family History  Problem Relation Age of Onset  .  Alzheimer's disease Unknown   . Coronary artery disease Unknown   . Breast cancer Unknown   . Arthritis Unknown   . Hypertension Unknown   . Diabetes Unknown     Social History Social History   Tobacco Use  . Smoking status: Never Smoker  . Smokeless tobacco: Never Used  Substance Use Topics  . Alcohol use: No  . Drug use: No     Allergies   Patient has no known allergies.   Review of Systems Review of Systems  Ten systems reviewed and are negative for acute change, except as noted in the HPI.   Physical Exam Updated Vital Signs BP 121/74 (BP Location: Left Arm)   Pulse 65   Temp 98.1 F (36.7 C) (Oral)   Resp 18   Ht 5\' 4"  (1.626 m)   Wt 84.4 kg (186 lb)    SpO2 100%   BMI 31.93 kg/m   Physical Exam  Constitutional: She is oriented to person, place, and time. She appears well-developed and well-nourished. No distress.  HENT:  Head: Normocephalic and atraumatic.  Eyes: Conjunctivae are normal. No scleral icterus.  Neck: Normal range of motion.  Cardiovascular: Normal rate, regular rhythm and normal heart sounds. Exam reveals no gallop and no friction rub.  No murmur heard. Pulmonary/Chest: Effort normal and breath sounds normal. No respiratory distress.  Abdominal: Soft. Bowel sounds are normal. She exhibits no distension and no mass. There is no tenderness. There is no guarding.  No CVA tenderness  Musculoskeletal:  TTP Left QL (Lumbar paraspinal)  worse with lateral flexion  Otherwise normal ROM, DTRS.  Neurological: She is alert and oriented to person, place, and time.  Skin: Skin is warm and dry. She is not diaphoretic.  Psychiatric: Her behavior is normal.  Nursing note and vitals reviewed.    ED Treatments / Results  Labs (all labs ordered are listed, but only abnormal results are displayed) Labs Reviewed  URINALYSIS, ROUTINE W REFLEX MICROSCOPIC - Abnormal; Notable for the following components:      Result Value   Hgb urine dipstick TRACE (*)    All other components within normal limits  URINALYSIS, MICROSCOPIC (REFLEX) - Abnormal; Notable for the following components:   Squamous Epithelial / LPF 0-5 (*)    All other components within normal limits  CBG MONITORING, ED    EKG  EKG Interpretation None       Radiology No results found.  Procedures Procedures (including critical care time)  Medications Ordered in ED Medications  dexamethasone (DECADRON) injection 10 mg (10 mg Intramuscular Given 08/31/17 1409)     Initial Impression / Assessment and Plan / ED Course  I have reviewed the triage vital signs and the nursing notes.  Pertinent labs & imaging results that were available during my care of the  patient were reviewed by me and considered in my medical decision making (see chart for details).     Patient with persistent cough after URI.  It has been about 4 weeks which is a normal course for persistent cough after URI.  She denies a history of reflux or heartburn.  She denies any wheezing or shortness of breath.  Patient also has musculoskeletal low back pain.  Her blood sugar is normal and I think her frequent urination is because she is drinking a lot of fluid.  Given her good blood sugar control I offer the patient a shot of Decadron to help with her airway inflammation.  Hopefully this will help  with her cough.  Patient is to follow-up with her primary care physician.  Return precautions.  She appears appropriate for discharge at this time  Final Clinical Impressions(s) / ED Diagnoses   Final diagnoses:  Cough  Lumbar muscle pain    ED Discharge Orders    None       Arthor Captain, PA-C 08/31/17 1654    Terrilee Files, MD 09/01/17 1224

## 2017-08-31 NOTE — Discharge Instructions (Signed)
Contact a primary care provider if: You have new symptoms. You cough up pus. Your cough does not get better after 2-3 weeks, or your cough gets worse. You cannot control your cough with suppressant medicines and you are losing sleep. You develop pain that is getting worse or pain that is not controlled with pain medicines. You have a fever. You have unexplained weight loss. You have night sweats. Come to the Emergency department if: You cough up blood. You have difficulty breathing. Your heartbeat is very fast.

## 2017-09-26 ENCOUNTER — Emergency Department (HOSPITAL_BASED_OUTPATIENT_CLINIC_OR_DEPARTMENT_OTHER): Payer: PRIVATE HEALTH INSURANCE

## 2017-09-26 ENCOUNTER — Emergency Department (HOSPITAL_BASED_OUTPATIENT_CLINIC_OR_DEPARTMENT_OTHER)
Admission: EM | Admit: 2017-09-26 | Discharge: 2017-09-26 | Disposition: A | Discharge status: Home / Self Care | Payer: PRIVATE HEALTH INSURANCE | Attending: Emergency Medicine | Admitting: Emergency Medicine

## 2017-09-26 ENCOUNTER — Encounter (HOSPITAL_BASED_OUTPATIENT_CLINIC_OR_DEPARTMENT_OTHER): Payer: Self-pay | Admitting: Emergency Medicine

## 2017-09-26 ENCOUNTER — Other Ambulatory Visit: Payer: Self-pay

## 2017-09-26 DIAGNOSIS — Z79899 Other long term (current) drug therapy: Secondary | ICD-10-CM | POA: Insufficient documentation

## 2017-09-26 DIAGNOSIS — I10 Essential (primary) hypertension: Secondary | ICD-10-CM | POA: Insufficient documentation

## 2017-09-26 DIAGNOSIS — L03116 Cellulitis of left lower limb: Secondary | ICD-10-CM | POA: Insufficient documentation

## 2017-09-26 DIAGNOSIS — E119 Type 2 diabetes mellitus without complications: Secondary | ICD-10-CM | POA: Diagnosis not present

## 2017-09-26 DIAGNOSIS — Z7984 Long term (current) use of oral hypoglycemic drugs: Secondary | ICD-10-CM | POA: Diagnosis not present

## 2017-09-26 DIAGNOSIS — R2242 Localized swelling, mass and lump, left lower limb: Secondary | ICD-10-CM | POA: Diagnosis present

## 2017-09-26 LAB — CBC WITH DIFFERENTIAL/PLATELET
Basophils Absolute: 0 10*3/uL (ref 0.0–0.1)
Basophils Relative: 0 %
Eosinophils Absolute: 0.3 10*3/uL (ref 0.0–0.7)
Eosinophils Relative: 4 %
HCT: 41 % (ref 36.0–46.0)
Hemoglobin: 13.6 g/dL (ref 12.0–15.0)
Lymphocytes Relative: 40 %
Lymphs Abs: 3.1 10*3/uL (ref 0.7–4.0)
MCH: 31 pg (ref 26.0–34.0)
MCHC: 33.2 g/dL (ref 30.0–36.0)
MCV: 93.4 fL (ref 78.0–100.0)
Monocytes Absolute: 0.7 10*3/uL (ref 0.1–1.0)
Monocytes Relative: 9 %
Neutro Abs: 3.7 10*3/uL (ref 1.7–7.7)
Neutrophils Relative %: 47 %
Platelets: 241 10*3/uL (ref 150–400)
RBC: 4.39 MIL/uL (ref 3.87–5.11)
RDW: 12.9 % (ref 11.5–15.5)
WBC: 7.8 10*3/uL (ref 4.0–10.5)

## 2017-09-26 LAB — BASIC METABOLIC PANEL
Anion gap: 8 (ref 5–15)
BUN: 14 mg/dL (ref 6–20)
CO2: 24 mmol/L (ref 22–32)
Calcium: 9 mg/dL (ref 8.9–10.3)
Chloride: 106 mmol/L (ref 101–111)
Creatinine, Ser: 0.68 mg/dL (ref 0.44–1.00)
GFR calc Af Amer: >60 mL/min (ref >60)
GFR calc non Af Amer: >60 mL/min (ref >60)
Glucose, Bld: 136 mg/dL — ABNORMAL HIGH (ref 65–99)
Potassium: 4 mmol/L (ref 3.5–5.1)
Sodium: 138 mmol/L (ref 135–145)

## 2017-09-26 LAB — TROPONIN I: Troponin I: <0.03 ng/mL (ref <0.03)

## 2017-09-26 MED ORDER — CLINDAMYCIN HCL 150 MG PO CAPS: 300.0000 mg | ORAL_CAPSULE | Freq: Three times a day (TID) | ORAL | 0 refills | Status: AC

## 2017-09-26 MED ORDER — CLINDAMYCIN PHOSPHATE 600 MG/50ML IV SOLN
600.0000 mg | Freq: Once | INTRAVENOUS | Status: AC
  Administered 2017-09-26: 600 mg via INTRAVENOUS
  Filled 2017-09-26: qty 50

## 2017-09-26 NOTE — ED Notes (Addendum)
Pt c/o sudden onset of "not feeling right, something is wrong", admits to CP/pressure and sob, no dyspnea noted. VSS. EKG obtained and EDP notified.

## 2017-09-26 NOTE — ED Notes (Signed)
ED Provider at bedside. 

## 2017-09-26 NOTE — ED Triage Notes (Signed)
C/o L lower leg swelling since yesterday. Also reports pain and warmth in calf. Denies recent travel, smoking hx, or hormone therapy.

## 2017-09-26 NOTE — ED Notes (Signed)
Ass[place] care of patient from [place], [place]. PT resting quietly. NO distress. NO complaints. Pt transported to Radiology.

## 2017-09-26 NOTE — ED Notes (Signed)
BLE DP,PT, popliteal and femoral pulses palpable and equal

## 2017-09-26 NOTE — ED Provider Notes (Signed)
[facility] EMERGENCY DEPARTMENT Provider Note   CSN: [id number] Arrival date & time: 09/26/17  0416     History   Chief Complaint Chief Complaint  Patient presents with  . Leg Swelling    HPI Erin Craig is a 57 y.o. female.  57yo F w/ PMH including HTN, HLD, T2DM who p/w L leg pain and swelling.  Patient states that yesterday she began having some itching of her left lower leg.  She went to work and walked around and began having progressively worsening swelling, warmth, and some redness of her lower leg.  She denies any trauma or injury.  She states that a few years ago she was admitted to another hospital and thinks that she had an infection of her lower leg.  She is not sure whether she was diagnosed with a blood clot but was never discharged with blood thinners.  She denies any fevers, vomiting, or recent illness.  She states that while she was in the waiting room, she started having some "tingling" in her chest that spontaneously resolved.  She denies any associated breathing problems.  No recent travel, prolonged immobilization, history of cancer, history of blood clots, or OCP use.   The history is provided by the patient.    Past Medical History:  Diagnosis Date  . Bradycardia   . Diabetes mellitus without complication (HCC)   . Heart murmur   . Hypercholesterolemia   . Hypertension     There are no active problems to display for this patient.   Past Surgical History:  Procedure Laterality Date  . ABDOMINAL HYSTERECTOMY    . CESAREAN SECTION    . CHOLECYSTECTOMY    . FOOT SURGERY      OB History    No data available       Home Medications    Prior to Admission medications   Medication Sig Start Date End Date Taking? Authorizing Provider  acetaminophen (TYLENOL) 325 MG tablet Take 325 mg by mouth every 6 (six) hours as needed. Headache/pain    [provider]  atorvastatin (LIPITOR) 10 MG tablet Take 10 mg by mouth daily.     [provider]  benzonatate (TESSALON) 100 MG capsule Take 1 capsule (100 mg total) by mouth every 8 (eight) hours. 08/08/17   Maczis, Elmer Sow, PA-C  cephALEXin (KEFLEX) 500 MG capsule Take 1 capsule (500 mg total) by mouth 4 (four) times daily. 08/22/17   Molpus, John, MD  chlorpheniramine-HYDROcodone (TUSSIONEX PENNKINETIC ER) 10-8 MG/5ML SUER Take 5 mLs by mouth every 12 (twelve) hours as needed for cough. 08/13/17   Molpus, John, MD  clindamycin (CLEOCIN) 150 MG capsule Take 2 capsules (300 mg total) by mouth 3 (three) times daily for 7 days. 09/26/17 10/03/17  Anjelo Pullman, Ambrose Finland, MD  esomeprazole (NEXIUM) 20 MG capsule Take 20 mg by mouth daily before breakfast.      [provider]  fluticasone (FLONASE) 50 MCG/ACT nasal spray Place 2 sprays into both nostrils daily. 08/08/17   Maczis, Elmer Sow, PA-C  glipiZIDE (GLUCOTROL) 5 MG tablet Take 10 mg by mouth daily before breakfast.    [provider]  Guaifenesin (MUCINEX MAXIMUM STRENGTH) 1200 MG TB12 Take 1 tablet (1,200 mg total) by mouth 2 (two) times daily. 08/08/17   Maczis, Elmer Sow, PA-C  lidocaine (XYLOCAINE) 2 % solution Use as directed 20 mLs in the mouth or throat as needed for mouth pain. 08/06/17   Rise Mu, PA-C  lisinopril (PRINIVIL,ZESTRIL)  5 MG tablet Take 5 mg by mouth daily.    [provider]  metFORMIN (GLUCOPHAGE) 1000 MG tablet Take 1,000 mg by mouth 1 day or 1 dose.    [provider]    Family History Family History  Problem Relation Age of Onset  . Alzheimer's disease Unknown   . Coronary artery disease Unknown   . Breast cancer Unknown   . Arthritis Unknown   . Hypertension Unknown   . Diabetes Unknown     Social History Social History   Tobacco Use  . Smoking status: Never Smoker  . Smokeless tobacco: Never Used  Substance Use Topics  . Alcohol use: No  . Drug use: No     Allergies   Patient has no known allergies.   Review of  Systems Review of Systems All other systems reviewed and are negative except that which was mentioned in HPI   Physical Exam Updated Vital Signs BP 106/80   Pulse (!) 56   Temp 98.5 F (36.9 C) (Oral)   Resp 16   Ht 5\' 4"  (1.626 m)   Wt 84.4 kg (186 lb)   SpO2 100%   BMI 31.93 kg/m   Physical Exam  Constitutional: She is oriented to person, place, and time. She appears well-developed and well-nourished. No distress.  HENT:  Head: Normocephalic and atraumatic.  Moist mucous membranes  Eyes: Conjunctivae are normal. Pupils are equal, round, and reactive to light.  Neck: Neck supple.  Cardiovascular: Normal rate, regular rhythm, normal heart sounds and intact distal pulses.  No murmur heard. Pulmonary/Chest: Effort normal and breath sounds normal.  Abdominal: Soft. Bowel sounds are normal. She exhibits no distension. There is no tenderness.  Musculoskeletal: She exhibits edema and tenderness.  Edema, mild erythema, and warmth of L lower leg extending to lateral malleolus, no medial malleolus tenderness  Neurological: She is alert and oriented to person, place, and time.  Fluent speech  Skin: Skin is warm and dry. There is erythema.  Psychiatric: She has a normal mood and affect. Judgment normal.  Nursing note and vitals reviewed.    ED Treatments / Results  Labs (all labs ordered are listed, but only abnormal results are displayed) Labs Reviewed  BASIC METABOLIC PANEL - Abnormal; Notable for the following components:      Result Value   Glucose, Bld 136 (*)    All other components within normal limits  CBC WITH DIFFERENTIAL/PLATELET  TROPONIN I    EKG  EKG Interpretation  Date/Time:  Sunday September 26 2017 05:54:03 EST Ventricular Rate:  61 PR Interval:    QRS Duration: 99 QT Interval:  408 QTC Calculation: 411 R Axis:   59 Text Interpretation:  Sinus rhythm Borderline T abnormalities, inferior leads No significant change since last tracing Confirmed by  Zadie RhineWickline, Donald ([id number]) on 09/26/2017 6:08:25 AM       Radiology Dg Tibia/fibula Left  Result Date: 09/26/2017 CLINICAL DATA:  Left lower leg swelling and redness EXAM: LEFT TIBIA AND FIBULA - 2 VIEW COMPARISON:  None available FINDINGS: Mild left lower extremity subcutaneous edema noted. Normal alignment without acute osseous finding or fracture. Single operative screw in the left first metatarsal distally. IMPRESSION: No acute osseous finding Electronically Signed   By: Judie PetitM.  Shick M.D.   On: 09/26/2017 08:23   [place] Venous Img Lower  Left (dvt Study)  Result Date: 09/26/2017 CLINICAL DATA:  Acute left distal calf and ankle pain with swelling EXAM: LEFT LOWER EXTREMITY VENOUS DOPPLER ULTRASOUND  TECHNIQUE: Gray-scale sonography with graded compression, as well as color Doppler and duplex ultrasound were performed to evaluate the lower extremity deep venous systems from the level of the common femoral vein and including the common femoral, femoral, profunda femoral, popliteal and calf veins including the posterior tibial, peroneal and gastrocnemius veins when visible. The superficial great saphenous vein was also interrogated. Spectral Doppler was utilized to evaluate flow at rest and with distal augmentation maneuvers in the common femoral, femoral and popliteal veins. COMPARISON:  None. FINDINGS: Contralateral Common Femoral Vein: Respiratory phasicity is normal and symmetric with the symptomatic side. No evidence of thrombus. Normal compressibility. Common Femoral Vein: No evidence of thrombus. Normal compressibility, respiratory phasicity and response to augmentation. Saphenofemoral Junction: No evidence of thrombus. Normal compressibility and flow on color Doppler imaging. Profunda Femoral Vein: No evidence of thrombus. Normal compressibility and flow on color Doppler imaging. Femoral Vein: No evidence of thrombus. Normal compressibility, respiratory phasicity and response to augmentation. Popliteal  Vein: No evidence of thrombus. Normal compressibility, respiratory phasicity and response to augmentation. Calf Veins: No evidence of thrombus. Normal compressibility and flow on color Doppler imaging. Superficial Great Saphenous Vein: No evidence of thrombus. Normal compressibility. Venous Reflux:  None. Other Findings:  None. IMPRESSION: No evidence of deep venous thrombosis. Electronically Signed   By: Judie Petit.  Shick M.D.   On: 09/26/2017 09:35    Procedures Procedures (including critical care time)  Medications Ordered in ED Medications  clindamycin (CLEOCIN) IVPB 600 mg (0 mg Intravenous Stopped 09/26/17 1116)     Initial Impression / Assessment and Plan / ED Course  I have reviewed the triage vital signs and the nursing notes.  Pertinent labs & imaging results that were available during my care of the patient were reviewed by me and considered in my medical decision making (see chart for details).    PT w/ 1 day of traumatic swelling, pain, and warmth of left lower leg.  She does report history of cellulitis in she was nontoxic on exam with reassuring vital signs.  Neurovascularly intact.  Lab work unremarkable including normal WBC count.  EKG and troponin normal.  She reported some chest "tingling but denies any actual chest pain and shortness of breath and all symptoms resolved prior to my evaluation, highly doubt ACS.  Given no actual chest pain or shortness of breath and normal vital signs, I also highly doubt PE.  Plain film of lower leg was normal.  Ultrasound negative for DVT.  Her exam suggests cellulitis.  Gave dose of clindamycin and course of clindamycin to complete at home.  I discussed supportive measures and emphasized the importance of elevation and resting her leg.  Extensively reviewed return precautions including fever or worsening symptoms.  She voiced understanding and was discharged in satisfactory condition.  Final Clinical Impressions(s) / ED Diagnoses   Final diagnoses:   Left leg cellulitis    ED Discharge Orders        Ordered    clindamycin (CLEOCIN) 150 MG capsule  3 times daily     09/26/17 1142       Elantra Caprara, Ambrose Finland, MD 09/26/17 1220

## 2017-09-26 NOTE — ED Notes (Signed)
Alert, NAD, calm, interactive, resps e/u, speaking in clear complete sentences, no dyspnea noted, skin W&D, VSS, "still feels not right with chest pressure and some sob". LS CTA.

## 2017-09-26 NOTE — ED Notes (Signed)
Alert, NAD, calm, interactive, resps e/u, speaking in clear complete sentences, no dyspnea noted, skin W&D, VSS.  c/o LLE redness, heat, pain and swelling from below knee to mid foot, primarily shin, CMS intact. Pedal and popliteal pulses palpable and equal, (denies: injury, surgery, travel, long sedentary periods, blood/clotting d/o, h/o same, fever, NVD, CP, SOB, or dizziness), sx onset yesterday.

## 2017-11-11 ENCOUNTER — Encounter (HOSPITAL_BASED_OUTPATIENT_CLINIC_OR_DEPARTMENT_OTHER): Payer: Self-pay | Admitting: *Deleted

## 2017-11-11 ENCOUNTER — Emergency Department (HOSPITAL_BASED_OUTPATIENT_CLINIC_OR_DEPARTMENT_OTHER): Payer: Self-pay

## 2017-11-11 ENCOUNTER — Emergency Department (HOSPITAL_BASED_OUTPATIENT_CLINIC_OR_DEPARTMENT_OTHER)
Admission: EM | Admit: 2017-11-11 | Discharge: 2017-11-11 | Disposition: A | Discharge status: Home / Self Care | Payer: Self-pay | Attending: Emergency Medicine | Admitting: Emergency Medicine

## 2017-11-11 ENCOUNTER — Other Ambulatory Visit: Payer: Self-pay

## 2017-11-11 DIAGNOSIS — Z79899 Other long term (current) drug therapy: Secondary | ICD-10-CM | POA: Insufficient documentation

## 2017-11-11 DIAGNOSIS — I10 Essential (primary) hypertension: Secondary | ICD-10-CM | POA: Insufficient documentation

## 2017-11-11 DIAGNOSIS — Z7984 Long term (current) use of oral hypoglycemic drugs: Secondary | ICD-10-CM | POA: Insufficient documentation

## 2017-11-11 DIAGNOSIS — M7989 Other specified soft tissue disorders: Secondary | ICD-10-CM

## 2017-11-11 DIAGNOSIS — E119 Type 2 diabetes mellitus without complications: Secondary | ICD-10-CM | POA: Insufficient documentation

## 2017-11-11 DIAGNOSIS — M79662 Pain in left lower leg: Secondary | ICD-10-CM | POA: Insufficient documentation

## 2017-11-11 LAB — CBC WITH DIFFERENTIAL/PLATELET
Basophils Absolute: 0 10*3/uL (ref 0.0–0.1)
Basophils Relative: 0 %
Eosinophils Absolute: 0.2 10*3/uL (ref 0.0–0.7)
Eosinophils Relative: 2 %
HCT: 38.3 % (ref 36.0–46.0)
Hemoglobin: 12.8 g/dL (ref 12.0–15.0)
Lymphocytes Relative: 40 %
Lymphs Abs: 3.2 10*3/uL (ref 0.7–4.0)
MCH: 30.8 pg (ref 26.0–34.0)
MCHC: 33.4 g/dL (ref 30.0–36.0)
MCV: 92.3 fL (ref 78.0–100.0)
Monocytes Absolute: 0.6 10*3/uL (ref 0.1–1.0)
Monocytes Relative: 8 %
Neutro Abs: 4.1 10*3/uL (ref 1.7–7.7)
Neutrophils Relative %: 50 %
Platelets: 217 10*3/uL (ref 150–400)
RBC: 4.15 MIL/uL (ref 3.87–5.11)
RDW: 12.6 % (ref 11.5–15.5)
WBC: 8.1 10*3/uL (ref 4.0–10.5)

## 2017-11-11 LAB — BASIC METABOLIC PANEL
Anion gap: 10 (ref 5–15)
BUN: 17 mg/dL (ref 6–20)
CO2: 19 mmol/L — ABNORMAL LOW (ref 22–32)
Calcium: 8.7 mg/dL — ABNORMAL LOW (ref 8.9–10.3)
Chloride: 107 mmol/L (ref 101–111)
Creatinine, Ser: 0.55 mg/dL (ref 0.44–1.00)
GFR calc Af Amer: >60 mL/min (ref >60)
GFR calc non Af Amer: >60 mL/min (ref >60)
Glucose, Bld: 99 mg/dL (ref 65–99)
Potassium: 3.9 mmol/L (ref 3.5–5.1)
Sodium: 136 mmol/L (ref 135–145)

## 2017-11-11 MED ORDER — IOPAMIDOL (ISOVUE-370) INJECTION 76%
100.0000 mL | Freq: Once | INTRAVENOUS | Status: AC | PRN
  Administered 2017-11-11: 100 mL via INTRAVENOUS

## 2017-11-11 MED ORDER — MELOXICAM 15 MG PO TABS: 15.0000 mg | ORAL_TABLET | Freq: Every day | ORAL | 0 refills | Status: DC

## 2017-11-11 MED ORDER — SODIUM CHLORIDE 0.9 % IV BOLUS
500.0000 mL | Freq: Once | INTRAVENOUS | Status: AC
  Administered 2017-11-11: 500 mL via INTRAVENOUS

## 2017-11-11 NOTE — ED Triage Notes (Signed)
Swelling pain and tingling in her left lower leg for a week. She is ambulatory.

## 2017-11-11 NOTE — ED Notes (Signed)
Pt. In US at present time. 

## 2017-11-11 NOTE — Discharge Instructions (Addendum)
Your work up shows no emergent causes of you leg swelling, therefore you do not appear to have any life, limb or organ threatening emergencies. You will need to continue an outpatient work up with your doctor.return for any signs of infection including fever, chills, heat and redness.

## 2017-11-11 NOTE — ED Notes (Signed)
Pt. Reports she is very uncomfortable with the swelling in her L lower leg and L foot.  Pt. Has noted warmth in the L lower leg and L foot with positive bounding pedal pulse and ability to move foot and all 5 toes WNL.

## 2017-11-11 NOTE — ED Provider Notes (Signed)
[facility] EMERGENCY DEPARTMENT Provider Note   CSN: [id number] Arrival date & time: 11/11/17  1359     History   Chief Complaint Chief Complaint  Patient presents with  . Leg Pain    HPI Erin Craig is a 57 y.o. female who presents for LLE swelling. She developed swelling in the left lower extremity about 1 month ago. She had a negative DUPlex and xray and was treated for cellulitis. She has had no resolution in te swelling. She has treid compression stockings without relief. The leg is painful and she has trouble walking for periods of time. SHe has associated intermittent paresthesia. She denies CP or SOB.  She has had no fever, chills or redness  HPI  Past Medical History:  Diagnosis Date  . Bradycardia   . Diabetes mellitus without complication (HCC)   . Heart murmur   . Hypercholesterolemia   . Hypertension     There are no active problems to display for this patient.   Past Surgical History:  Procedure Laterality Date  . ABDOMINAL HYSTERECTOMY    . CESAREAN SECTION    . CHOLECYSTECTOMY    . FOOT SURGERY       OB History   None      Home Medications    Prior to Admission medications   Medication Sig Start Date End Date Taking? Authorizing Provider  acetaminophen (TYLENOL) 325 MG tablet Take 325 mg by mouth every 6 (six) hours as needed. Headache/pain    [provider]  atorvastatin (LIPITOR) 10 MG tablet Take 10 mg by mouth daily.    [provider]  benzonatate (TESSALON) 100 MG capsule Take 1 capsule (100 mg total) by mouth every 8 (eight) hours. 08/08/17   Maczis, Elmer Sow, PA-C  cephALEXin (KEFLEX) 500 MG capsule Take 1 capsule (500 mg total) by mouth 4 (four) times daily. 08/22/17   Molpus, John, MD  chlorpheniramine-HYDROcodone (TUSSIONEX PENNKINETIC ER) 10-8 MG/5ML SUER Take 5 mLs by mouth every 12 (twelve) hours as needed for cough. 08/13/17   Molpus, John, MD  esomeprazole (NEXIUM) 20 MG capsule Take 20 mg by mouth  daily before breakfast.      [provider]  fluticasone (FLONASE) 50 MCG/ACT nasal spray Place 2 sprays into both nostrils daily. 08/08/17   Maczis, Elmer Sow, PA-C  glipiZIDE (GLUCOTROL) 5 MG tablet Take 10 mg by mouth daily before breakfast.    [provider]  Guaifenesin (MUCINEX MAXIMUM STRENGTH) 1200 MG TB12 Take 1 tablet (1,200 mg total) by mouth 2 (two) times daily. 08/08/17   Maczis, Elmer Sow, PA-C  lidocaine (XYLOCAINE) 2 % solution Use as directed 20 mLs in the mouth or throat as needed for mouth pain. 08/06/17   Rise Mu, PA-C  lisinopril (PRINIVIL,ZESTRIL) 5 MG tablet Take 5 mg by mouth daily.    [provider]  metFORMIN (GLUCOPHAGE) 1000 MG tablet Take 1,000 mg by mouth 1 day or 1 dose.    [provider]    Family History Family History  Problem Relation Age of Onset  . Alzheimer's disease Unknown   . Coronary artery disease Unknown   . Breast cancer Unknown   . Arthritis Unknown   . Hypertension Unknown   . Diabetes Unknown     Social History Social History   Tobacco Use  . Smoking status: Never Smoker  . Smokeless tobacco: Never Used  Substance Use Topics  . Alcohol use: No  . Drug use: No  Allergies   Patient has no known allergies.   Review of Systems Review of Systems  Ten systems reviewed and are negative for acute change, except as noted in the HPI.   Physical Exam Updated Vital Signs BP (!) 124/57   Pulse 66   Temp 99 F (37.2 C) (Oral)   Resp 18   Ht 5\' 4"  (1.626 m)   Wt 87.2 kg (192 lb 3.9 oz)   SpO2 97%   BMI 33.00 kg/m   Physical Exam  Constitutional: She is oriented to person, place, and time. She appears well-developed and well-nourished. No distress.  HENT:  Head: Normocephalic and atraumatic.  Eyes: Conjunctivae are normal. No scleral icterus.  Neck: Normal range of motion.  Cardiovascular: Normal rate, regular rhythm and normal heart sounds. Exam reveals no gallop and no  friction rub.  No murmur heard. Pulmonary/Chest: Effort normal and breath sounds normal. No respiratory distress.  Abdominal: Soft. Bowel sounds are normal. She exhibits no distension and no mass. There is no tenderness. There is no guarding.  Musculoskeletal: She exhibits edema.  LLE with non pitting edema. Markedly edematous. NVI  Neurological: She is alert and oriented to person, place, and time.  Skin: Skin is warm and dry. She is not diaphoretic.  Psychiatric: Her behavior is normal.  Nursing note and vitals reviewed.    ED Treatments / Results  Labs (all labs ordered are listed, but only abnormal results are displayed) Labs Reviewed - No data to display  EKG None  Radiology No results found.  Procedures Procedures (including critical care time)  Medications Ordered in ED Medications - No data to display   Initial Impression / Assessment and Plan / ED Course  I have reviewed the triage vital signs and the nursing notes.  Pertinent labs & imaging results that were available during my care of the patient were reviewed by me and considered in my medical decision making (see chart for details).   Patient ultrasound negative, no evidence of infection.  Patient CT scan of the abdomen shows no compression against the vasculature, abdominal masses or other cause of unilateral leg swelling.  I discussed the findings with the patient.  Patient is advised to follow-up with PCP, continue using compression, take anti-inflammatories for pain, ice and elevate the leg.  I discussed return precautions she appears appropriate for discharge at this time    Final Clinical Impressions(s) / ED Diagnoses   Final diagnoses:  None    ED Discharge Orders    None       Arthor CaptainHarris, Donatella Walski, PA-C 11/11/17 1945    Gwyneth SproutPlunkett, Whitney, MD 11/13/17 [phone]

## 2017-11-11 NOTE — ED Notes (Signed)
ED Provider at bedside. 

## 2017-11-11 NOTE — ED Notes (Signed)
Pt. Aware we are waiting on the CT results before any Discharge is done.

## 2017-12-04 ENCOUNTER — Encounter (HOSPITAL_BASED_OUTPATIENT_CLINIC_OR_DEPARTMENT_OTHER): Payer: Self-pay

## 2017-12-04 ENCOUNTER — Emergency Department (HOSPITAL_BASED_OUTPATIENT_CLINIC_OR_DEPARTMENT_OTHER)
Admission: EM | Admit: 2017-12-04 | Discharge: 2017-12-04 | Disposition: A | Discharge status: Home / Self Care | Payer: No Typology Code available for payment source | Attending: Emergency Medicine | Admitting: Emergency Medicine

## 2017-12-04 ENCOUNTER — Other Ambulatory Visit: Payer: Self-pay

## 2017-12-04 DIAGNOSIS — Z7984 Long term (current) use of oral hypoglycemic drugs: Secondary | ICD-10-CM | POA: Insufficient documentation

## 2017-12-04 DIAGNOSIS — Z79899 Other long term (current) drug therapy: Secondary | ICD-10-CM | POA: Insufficient documentation

## 2017-12-04 DIAGNOSIS — M79606 Pain in leg, unspecified: Secondary | ICD-10-CM

## 2017-12-04 DIAGNOSIS — E119 Type 2 diabetes mellitus without complications: Secondary | ICD-10-CM | POA: Insufficient documentation

## 2017-12-04 DIAGNOSIS — M79661 Pain in right lower leg: Secondary | ICD-10-CM | POA: Insufficient documentation

## 2017-12-04 DIAGNOSIS — I10 Essential (primary) hypertension: Secondary | ICD-10-CM | POA: Insufficient documentation

## 2017-12-04 DIAGNOSIS — G8929 Other chronic pain: Secondary | ICD-10-CM

## 2017-12-04 DIAGNOSIS — M79662 Pain in left lower leg: Secondary | ICD-10-CM | POA: Insufficient documentation

## 2017-12-04 LAB — BASIC METABOLIC PANEL
Anion gap: 8 (ref 5–15)
BUN: 16 mg/dL (ref 6–20)
CO2: 21 mmol/L — ABNORMAL LOW (ref 22–32)
Calcium: 8.5 mg/dL — ABNORMAL LOW (ref 8.9–10.3)
Chloride: 107 mmol/L (ref 101–111)
Creatinine, Ser: 0.61 mg/dL (ref 0.44–1.00)
GFR calc Af Amer: >60 mL/min (ref >60)
GFR calc non Af Amer: >60 mL/min (ref >60)
Glucose, Bld: 164 mg/dL — ABNORMAL HIGH (ref 65–99)
Potassium: 3.6 mmol/L (ref 3.5–5.1)
Sodium: 136 mmol/L (ref 135–145)

## 2017-12-04 MED ORDER — DICLOFENAC SODIUM ER 100 MG PO TB24: 100.0000 mg | ORAL_TABLET | Freq: Every day | ORAL | 0 refills | Status: DC

## 2017-12-04 MED ORDER — KETOROLAC TROMETHAMINE 60 MG/2ML IM SOLN
30.0000 mg | Freq: Once | INTRAMUSCULAR | Status: AC
  Administered 2017-12-04: 30 mg via INTRAMUSCULAR
  Filled 2017-12-04: qty 2

## 2017-12-04 NOTE — ED Provider Notes (Signed)
[facility] EMERGENCY DEPARTMENT Provider Note   CSN: [id number] Arrival date & time: 12/04/17  0230     History   Chief Complaint Chief Complaint  Patient presents with  . Leg Pain    HPI Erin Craig is a 57 y.o. female.  The history is provided by the patient.  Leg Pain   This is a chronic problem. The current episode started more than 1 week ago (years). The problem occurs constantly. The problem has not changed since onset.The pain is present in the left lower leg and right lower leg. The quality of the pain is described as aching. The pain is severe. Pertinent negatives include no numbness, full range of motion, no stiffness, no tingling and no itching. She has tried nothing for the symptoms. The treatment provided no relief. There has been no history of extremity trauma. Family history is significant for no rheumatoid arthritis and no gout.  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    Past Medical History:  Diagnosis Date  . Bradycardia   . Diabetes mellitus without complication (HCC)   . Heart murmur   . Hypercholesterolemia   . Hypertension     There are no active problems to display for this patient.   Past Surgical History:  Procedure Laterality Date  . ABDOMINAL HYSTERECTOMY    . CESAREAN SECTION    . CHOLECYSTECTOMY    . FOOT SURGERY       OB History   None      Home Medications    Prior to Admission medications   Medication Sig Start Date End Date Taking? Authorizing Provider  acetaminophen (TYLENOL) 325 MG tablet Take 325 mg by mouth every 6 (six) hours as needed. Headache/pain    [provider]  atorvastatin (LIPITOR) 10 MG tablet Take 10 mg by mouth daily.    [provider]  benzonatate (TESSALON) 100 MG capsule Take 1 capsule (100 mg total) by mouth every 8 (eight) hours. 08/08/17   Maczis, Elmer Sow, PA-C  cephALEXin (KEFLEX) 500 MG  capsule Take 1 capsule (500 mg total) by mouth 4 (four) times daily. 08/22/17   Molpus, John, MD  chlorpheniramine-HYDROcodone (TUSSIONEX PENNKINETIC ER) 10-8 MG/5ML SUER Take 5 mLs by mouth every 12 (twelve) hours as needed for cough. 08/13/17   Molpus, John, MD  esomeprazole (NEXIUM) 20 MG capsule Take 20 mg by mouth daily before breakfast.      [provider]  fluticasone (FLONASE) 50 MCG/ACT nasal spray Place 2 sprays into both nostrils daily. 08/08/17   Maczis, Elmer Sow, PA-C  glipiZIDE (GLUCOTROL) 5 MG tablet Take 10 mg by mouth daily before breakfast.    [provider]  Guaifenesin (MUCINEX MAXIMUM STRENGTH) 1200 MG TB12 Take 1 tablet (1,200 mg total) by mouth 2 (two) times daily. 08/08/17   Maczis, Elmer Sow, PA-C  lidocaine (XYLOCAINE) 2 % solution Use as directed 20 mLs in the mouth or throat as needed for mouth pain. 08/06/17   Rise Mu, PA-C  lisinopril (PRINIVIL,ZESTRIL) 5 MG tablet Take 5 mg by mouth daily.    [provider]  meloxicam (MOBIC) 15 MG tablet Take 1 tablet (15 mg total) by mouth daily. Take 1 daily with food. 11/11/17   Arthor Captain, PA-C  metFORMIN (GLUCOPHAGE) 1000 MG tablet Take 1,000 mg by mouth 1 day or 1 dose.    [provider]  Family History Family History  Problem Relation Age of Onset  . Alzheimer's disease Unknown   . Coronary artery disease Unknown   . Breast cancer Unknown   . Arthritis Unknown   . Hypertension Unknown   . Diabetes Unknown     Social History Social History   Tobacco Use  . Smoking status: Never Smoker  . Smokeless tobacco: Never Used  Substance Use Topics  . Alcohol use: No  . Drug use: No     Allergies   Patient has no known allergies.   Review of Systems Review of Systems  Constitutional: Negative for fever.  Respiratory: Negative for cough and shortness of breath.   Cardiovascular: Negative for chest pain, palpitations and leg swelling.  Musculoskeletal:  Positive for arthralgias. Negative for back pain, neck pain and stiffness.  Skin: Negative for color change, itching, pallor and rash.  Neurological: Negative for tingling, weakness and numbness.  All other systems reviewed and are negative.    Physical Exam Updated Vital Signs BP 129/80 (BP Location: Right Arm)   Pulse 61   Temp 98.4 F (36.9 C) (Oral)   Resp 18   Ht  (1.626 m)   Wt 84.4 kg (186 lb)   LMP  (LMP Unknown)   SpO2 99%   BMI 31.93 kg/m   Physical Exam  Constitutional: She is oriented to person, place, and time. She appears well-developed and well-nourished. No distress.  HENT:  Head: Normocephalic and atraumatic.  Mouth/Throat: No oropharyngeal exudate.  Eyes: Pupils are equal, round, and reactive to light. Conjunctivae are normal.  Neck: Normal range of motion. Neck supple.  Cardiovascular: Normal rate, regular rhythm, normal heart sounds and intact distal pulses.  Pulmonary/Chest: Effort normal and breath sounds normal. No stridor. She has no wheezes. She has no rales.  Abdominal: Soft. Bowel sounds are normal. She exhibits no mass. There is no tenderness. There is no rebound and no guarding.  Musculoskeletal: Normal range of motion.       Right knee: Normal.       Left knee: Normal.       Right ankle: Normal. Achilles tendon normal.       Left ankle: Normal. Achilles tendon normal.       Right lower leg: Normal.       Left lower leg: Normal.       Right foot: Normal. There is no swelling and normal capillary refill.       Left foot: Normal. There is no swelling and normal capillary refill.  No warmth nor erythema of either lower extremity.  Negative Homan's sign.  No edema.   Neurological: She is alert and oriented to person, place, and time. She displays normal reflexes.  Skin: Skin is warm and dry. Capillary refill takes less than 2 seconds. No erythema.  Psychiatric: She has a normal mood and affect.     ED Treatments / Results  Labs (all labs  ordered are listed, but only abnormal results are displayed) Labs Reviewed  BASIC METABOLIC PANEL    EKG None  Radiology No results found.  Procedures Procedures (including critical care time)  Medications Ordered in ED Medications  ketorolac (TORADOL) injection 30 mg (has no administration in time range)    Pain medication ordered immediately.  Patient has had multiple dopplers and exams for her chronic pain.  I do not feel imaging is indicated at this time and there are no signs of infection.  Given chronic nature of the pain I  do not feel narcotics are indicated.  EDP explained at length that we would give patient an injection for pain and get basic labs to check electrolytes and is these are normal we would give RX for NSAID and refer back to PMD for ongoing care.  Patient verbalized understanding of this information.     Patient reportedly made comments about EDP to nurse as EDP may have seen patient's husband in the past.    Final Clinical Impressions(s) / ED Diagnoses   Return for weakness, numbness, changes in vision or speech, fevers >100.4 unrelieved by medication, shortness of breath, intractable vomiting, or diarrhea, abdominal pain, Inability to tolerate liquids or food, cough, altered mental status or any concerns. No signs of systemic illness or infection. The patient is nontoxic-appearing on exam and vital signs are within normal limits.   I have reviewed the triage vital signs and the nursing notes. Pertinent labs &imaging results that were available during my care of the patient were reviewed by me and considered in my medical decision making (see chart for details).  After history, exam, and medical workup I feel the patient has been appropriately medically screened and is safe for discharge home. Pertinent diagnoses were discussed with the patient. Patient was given return precautions.   Guyla Bless, MD 12/04/17 2956

## 2017-12-04 NOTE — ED Triage Notes (Addendum)
Pt c/o bilateral lower leg pain that has been unrelieved by tylenol, no injury, pt has chronic leg swelling but states this is different.  States she does not wear the compression stockings as were recommended, states she has followed up with her PCP since previous visit and they have not found anything

## 2017-12-04 NOTE — ED Notes (Signed)
Pt given warm blanket and turned lights down.  Pt mentioned that she remembered the EDP from when Dr. Nicanor Alcon treated her husband at [facility], and pt informed that as a [facility] facility, we share the providers among each facility.  Pt denies any needs at this time.

## 2017-12-04 NOTE — ED Notes (Signed)
Pt verbalizes understanding of d/c instructions and denies any further needs at this time. 

## 2017-12-28 ENCOUNTER — Encounter (HOSPITAL_BASED_OUTPATIENT_CLINIC_OR_DEPARTMENT_OTHER): Payer: Self-pay | Admitting: *Deleted

## 2017-12-28 ENCOUNTER — Other Ambulatory Visit: Payer: Self-pay

## 2017-12-28 ENCOUNTER — Emergency Department (HOSPITAL_BASED_OUTPATIENT_CLINIC_OR_DEPARTMENT_OTHER)
Admission: EM | Admit: 2017-12-28 | Discharge: 2017-12-28 | Disposition: A | Discharge status: Home / Self Care | Payer: Self-pay | Attending: Emergency Medicine | Admitting: Emergency Medicine

## 2017-12-28 DIAGNOSIS — Z7984 Long term (current) use of oral hypoglycemic drugs: Secondary | ICD-10-CM | POA: Insufficient documentation

## 2017-12-28 DIAGNOSIS — E119 Type 2 diabetes mellitus without complications: Secondary | ICD-10-CM | POA: Insufficient documentation

## 2017-12-28 DIAGNOSIS — Z9049 Acquired absence of other specified parts of digestive tract: Secondary | ICD-10-CM | POA: Insufficient documentation

## 2017-12-28 DIAGNOSIS — M25561 Pain in right knee: Secondary | ICD-10-CM | POA: Insufficient documentation

## 2017-12-28 DIAGNOSIS — Z79899 Other long term (current) drug therapy: Secondary | ICD-10-CM | POA: Insufficient documentation

## 2017-12-28 DIAGNOSIS — I10 Essential (primary) hypertension: Secondary | ICD-10-CM | POA: Insufficient documentation

## 2017-12-28 MED ORDER — DICLOFENAC SODIUM 1 % TD GEL: 4.0000 g | Freq: Four times a day (QID) | TRANSDERMAL | 0 refills | Status: DC

## 2017-12-28 MED ORDER — KETOROLAC TROMETHAMINE 60 MG/2ML IM SOLN
30.0000 mg | Freq: Once | INTRAMUSCULAR | Status: AC
  Administered 2017-12-28: 30 mg via INTRAMUSCULAR
  Filled 2017-12-28: qty 2

## 2017-12-28 NOTE — ED Triage Notes (Signed)
Hx of chronic leg swelling. Here for swelling and pain in her right knee. She was seen last month for same.

## 2017-12-28 NOTE — Discharge Instructions (Addendum)
You have been seen today for knee pain.  Pain: Take 600 mg of ibuprofen every 6 hours or 440 mg (over the counter dose) to 500 mg (prescription dose) of naproxen every 12 hours for the next 3 days. After this time, these medications may be used as needed for pain. Take these medications with food to avoid upset stomach. Choose only one of these medications, do not take them together. Do not take these medications at the same time as other NSAIDS, such as oral diclofenac. Tylenol: Should you continue to have additional pain while taking the ibuprofen or naproxen, you may add in tylenol as needed. Your daily total maximum amount of tylenol from all sources should be limited to /day for persons without liver problems, or /day for those with liver problems. Diclofenac gel: As an alternative to the systemic NSAIDs, such as ibuprofen or naproxen, you may try using the diclofenac gel. Ice: May apply ice to the area over the next 24 hours for 15 minutes at a time to reduce swelling. Elevation: Keep the extremity elevated as often as possible to reduce pain and inflammation. Support: Wear the knee sleeve for support and comfort. Wear this until pain resolves. You will be weight-bearing as tolerated, which means you can slowly start to put weight on the extremity and increase amount and frequency as pain allows.  Follow up: Follow-up with the orthopedic specialist on this matter.

## 2017-12-28 NOTE — ED Provider Notes (Signed)
[facility] EMERGENCY DEPARTMENT Provider Note   CSN: [id number] Arrival date & time: 12/28/17  1547     History   Chief Complaint Chief Complaint  Patient presents with  . Leg Pain    HPI Erin Craig is a 57 y.o. female.  HPI   Erin Craig is a 57 y.o. female, with a history of DM and HTN, presenting to the ED with right knee pain for the last month.  Pain is sharp and aching, worse with ambulation, moderate to severe, located mostly on the lateral side, radiating to the medial side.  She has been taking Tylenol and applying BenGay.  Denies fever, weakness, numbness, injury, falls, or any other complaints.     Past Medical History:  Diagnosis Date  . Bradycardia   . Diabetes mellitus without complication (HCC)   . Heart murmur   . Hypercholesterolemia   . Hypertension     There are no active problems to display for this patient.   Past Surgical History:  Procedure Laterality Date  . ABDOMINAL HYSTERECTOMY    . CESAREAN SECTION    . CHOLECYSTECTOMY    . FOOT SURGERY       OB History   None      Home Medications    Prior to Admission medications   Medication Sig Start Date End Date Taking? Authorizing Provider  acetaminophen (TYLENOL) 325 MG tablet Take 325 mg by mouth every 6 (six) hours as needed. Headache/pain    [provider]  atorvastatin (LIPITOR) 10 MG tablet Take 10 mg by mouth daily.    [provider]  benzonatate (TESSALON) 100 MG capsule Take 1 capsule (100 mg total) by mouth every 8 (eight) hours. 08/08/17   Maczis, Elmer Sow, PA-C  cephALEXin (KEFLEX) 500 MG capsule Take 1 capsule (500 mg total) by mouth 4 (four) times daily. 08/22/17   Molpus, John, MD  chlorpheniramine-HYDROcodone (TUSSIONEX PENNKINETIC ER) 10-8 MG/5ML SUER Take 5 mLs by mouth every 12 (twelve) hours as needed for cough. 08/13/17   Molpus, John, MD  diclofenac sodium (VOLTAREN) 1 % GEL Apply 4 g topically 4 (four) times daily. 12/28/17   Joy,  Shawn C, PA-C  Diclofenac Sodium CR (VOLTAREN-XR) 100 MG 24 hr tablet Take 1 tablet (100 mg total) by mouth daily. 12/04/17   Palumbo, April, MD  esomeprazole (NEXIUM) 20 MG capsule Take 20 mg by mouth daily before breakfast.      [provider]  fluticasone (FLONASE) 50 MCG/ACT nasal spray Place 2 sprays into both nostrils daily. 08/08/17   Maczis, Elmer Sow, PA-C  glipiZIDE (GLUCOTROL) 5 MG tablet Take 10 mg by mouth daily before breakfast.    [provider]  Guaifenesin (MUCINEX MAXIMUM STRENGTH) 1200 MG TB12 Take 1 tablet (1,200 mg total) by mouth 2 (two) times daily. 08/08/17   Maczis, Elmer Sow, PA-C  lidocaine (XYLOCAINE) 2 % solution Use as directed 20 mLs in the mouth or throat as needed for mouth pain. 08/06/17   Rise Mu, PA-C  lisinopril (PRINIVIL,ZESTRIL) 5 MG tablet Take 5 mg by mouth daily.    [provider]  meloxicam (MOBIC) 15 MG tablet Take 1 tablet (15 mg total) by mouth daily. Take 1 daily with food. 11/11/17   Arthor Captain, PA-C  metFORMIN (GLUCOPHAGE) 1000 MG tablet Take 1,000 mg by mouth 1 day or 1 dose.    [provider]    Family History Family History  Problem Relation Age of Onset  .  Alzheimer's disease Unknown   . Coronary artery disease Unknown   . Breast cancer Unknown   . Arthritis Unknown   . Hypertension Unknown   . Diabetes Unknown     Social History Social History   Tobacco Use  . Smoking status: Never Smoker  . Smokeless tobacco: Never Used  Substance Use Topics  . Alcohol use: No  . Drug use: No     Allergies   Patient has no known allergies.   Review of Systems Review of Systems  Constitutional: Negative for fever.  Musculoskeletal: Positive for arthralgias.  Neurological: Negative for weakness and numbness.     Physical Exam Updated Vital Signs BP 113/62   Pulse 63   Temp 98.3 F (36.8 C) (Oral)   Resp 18   Ht  (1.626 m)   Wt 84.4 kg (186 lb)   LMP  (LMP Unknown)    SpO2 98%   BMI 31.93 kg/m   Physical Exam  Constitutional: She appears well-developed and well-nourished. No distress.  HENT:  Head: Normocephalic and atraumatic.  Eyes: Conjunctivae are normal.  Neck: Neck supple.  Cardiovascular: Normal rate, regular rhythm and intact distal pulses.  Pulses:      Dorsalis pedis pulses are 2+ on the right side, and 2+ on the left side.       Posterior tibial pulses are 2+ on the right side, and 2+ on the left side.  Pulmonary/Chest: Effort normal.  Musculoskeletal:  Tenderness to the right lateral knee with mild swelling.  No erythema or increased warmth. Increased lateral pain with valgus stress.  No instability or deformity.  Neurological: She is alert.  Sensation to light touch intact in the bilateral lower extremities. Strength 5/5 with flexion and extension at the right knee and ankle.  Skin: Skin is warm and dry. She is not diaphoretic. No pallor.  Psychiatric: She has a normal mood and affect. Her behavior is normal.  Nursing note and vitals reviewed.    ED Treatments / Results  Labs (all labs ordered are listed, but only abnormal results are displayed) Labs Reviewed - No data to display  EKG None  Radiology No results found.  Procedures Procedures (including critical care time)  Medications Ordered in ED Medications  ketorolac (TORADOL) injection 30 mg (has no administration in time range)     Initial Impression / Assessment and Plan / ED Course  I have reviewed the triage vital signs and the nursing notes.  Pertinent labs & imaging results that were available during my care of the patient were reviewed by me and considered in my medical decision making (see chart for details).     Patient with right knee pain for the last month.  Pattern of pain and exam findings give suspicion for possible meniscal abnormality.  No noted instability.  Knee sleeve, crutches, and orthopedic follow-up. The patient was given instructions  for home care as well as return precautions. Patient voices understanding of these instructions, accepts the plan, and is comfortable with discharge.     Final Clinical Impressions(s) / ED Diagnoses   Final diagnoses:  Acute pain of right knee    ED Discharge Orders        Ordered    diclofenac sodium (VOLTAREN) 1 % GEL  4 times daily     12/28/17 1806       Concepcion Living 12/28/17 1819    Terrilee Files, MD 12/29/17 1008

## 2018-01-07 ENCOUNTER — Emergency Department (HOSPITAL_BASED_OUTPATIENT_CLINIC_OR_DEPARTMENT_OTHER)
Admission: EM | Admit: 2018-01-07 | Discharge: 2018-01-07 | Disposition: A | Discharge status: Home / Self Care | Payer: Self-pay | Attending: Emergency Medicine | Admitting: Emergency Medicine

## 2018-01-07 ENCOUNTER — Other Ambulatory Visit: Payer: Self-pay

## 2018-01-07 ENCOUNTER — Ambulatory Visit (HOSPITAL_BASED_OUTPATIENT_CLINIC_OR_DEPARTMENT_OTHER)
Admit: 2018-01-07 | Discharge: 2018-01-07 | Disposition: A | Discharge status: Home / Self Care | Payer: Self-pay | Attending: Emergency Medicine | Admitting: Emergency Medicine

## 2018-01-07 ENCOUNTER — Encounter (HOSPITAL_BASED_OUTPATIENT_CLINIC_OR_DEPARTMENT_OTHER): Payer: Self-pay | Admitting: Emergency Medicine

## 2018-01-07 DIAGNOSIS — Z8679 Personal history of other diseases of the circulatory system: Secondary | ICD-10-CM | POA: Insufficient documentation

## 2018-01-07 DIAGNOSIS — I82401 Acute embolism and thrombosis of unspecified deep veins of right lower extremity: Secondary | ICD-10-CM

## 2018-01-07 DIAGNOSIS — Z7984 Long term (current) use of oral hypoglycemic drugs: Secondary | ICD-10-CM | POA: Insufficient documentation

## 2018-01-07 DIAGNOSIS — I10 Essential (primary) hypertension: Secondary | ICD-10-CM | POA: Insufficient documentation

## 2018-01-07 DIAGNOSIS — Z86718 Personal history of other venous thrombosis and embolism: Secondary | ICD-10-CM | POA: Insufficient documentation

## 2018-01-07 DIAGNOSIS — M7989 Other specified soft tissue disorders: Secondary | ICD-10-CM | POA: Insufficient documentation

## 2018-01-07 DIAGNOSIS — Z79899 Other long term (current) drug therapy: Secondary | ICD-10-CM | POA: Insufficient documentation

## 2018-01-07 DIAGNOSIS — M79606 Pain in leg, unspecified: Secondary | ICD-10-CM

## 2018-01-07 DIAGNOSIS — E119 Type 2 diabetes mellitus without complications: Secondary | ICD-10-CM | POA: Insufficient documentation

## 2018-01-07 DIAGNOSIS — M7121 Synovial cyst of popliteal space [Baker], right knee: Secondary | ICD-10-CM | POA: Insufficient documentation

## 2018-01-07 DIAGNOSIS — G8929 Other chronic pain: Secondary | ICD-10-CM | POA: Insufficient documentation

## 2018-01-07 MED ORDER — NAPROXEN 375 MG PO TABS: 375.0000 mg | ORAL_TABLET | Freq: Two times a day (BID) | ORAL | 0 refills | Status: DC

## 2018-01-07 MED ORDER — NAPROXEN 250 MG PO TABS
500.0000 mg | ORAL_TABLET | Freq: Once | ORAL | Status: AC
  Administered 2018-01-07: 500 mg via ORAL
  Filled 2018-01-07: qty 2

## 2018-01-07 MED FILL — NAPROXEN 375 MG TABLET: 375 | 10 days supply | Qty: 20 | Fill #0

## 2018-01-07 NOTE — ED Provider Notes (Signed)
[facility] EMERGENCY DEPARTMENT Provider Note   CSN: [id number] Arrival date & time: 01/07/18  4098     History   Chief Complaint Chief Complaint  Patient presents with  . Leg Swelling    HPI Erin Craig is a 57 y.o. female.  The history is provided by the patient.  Leg Pain   This is a chronic problem. The current episode started more than 1 week ago (many months). The problem occurs constantly. The problem has not changed since onset.Pain location: B feet and legs. The quality of the pain is described as pounding. The pain is severe. Pertinent negatives include no numbness. The symptoms are aggravated by activity. Treatments tried: pain medication and compression stockings. The treatment provided no relief. There has been no history of extremity trauma. Family history is significant for no rheumatoid arthritis.  Patient with a h/o of leg pain and swelling who presents with ongoing issues.  States she has not followed up with her PMD and is not wearing compression stockings as that makes it worse.  Has had work ups for the same.    Past Medical History:  Diagnosis Date  . Bradycardia   . Diabetes mellitus without complication (HCC)   . Heart murmur   . Hypercholesterolemia   . Hypertension     There are no active problems to display for this patient.   Past Surgical History:  Procedure Laterality Date  . ABDOMINAL HYSTERECTOMY    . CESAREAN SECTION    . CHOLECYSTECTOMY    . FOOT SURGERY       OB History   None      Home Medications    Prior to Admission medications   Medication Sig Start Date End Date Taking? Authorizing Provider  acetaminophen (TYLENOL) 325 MG tablet Take 325 mg by mouth every 6 (six) hours as needed. Headache/pain    [provider]  atorvastatin (LIPITOR) 10 MG tablet Take 10 mg by mouth daily.    [provider]  benzonatate (TESSALON) 100 MG capsule Take 1 capsule (100 mg total) by mouth every 8 (eight)  hours. 08/08/17   Maczis, Elmer Sow, PA-C  cephALEXin (KEFLEX) 500 MG capsule Take 1 capsule (500 mg total) by mouth 4 (four) times daily. 08/22/17   Molpus, John, MD  chlorpheniramine-HYDROcodone (TUSSIONEX PENNKINETIC ER) 10-8 MG/5ML SUER Take 5 mLs by mouth every 12 (twelve) hours as needed for cough. 08/13/17   Molpus, John, MD  diclofenac sodium (VOLTAREN) 1 % GEL Apply 4 g topically 4 (four) times daily. 12/28/17   Joy, Shawn C, PA-C  Diclofenac Sodium CR (VOLTAREN-XR) 100 MG 24 hr tablet Take 1 tablet (100 mg total) by mouth daily. 12/04/17   Quanta Robertshaw, MD  esomeprazole (NEXIUM) 20 MG capsule Take 20 mg by mouth daily before breakfast.      [provider]  fluticasone (FLONASE) 50 MCG/ACT nasal spray Place 2 sprays into both nostrils daily. 08/08/17   Maczis, Elmer Sow, PA-C  glipiZIDE (GLUCOTROL) 5 MG tablet Take 10 mg by mouth daily before breakfast.    [provider]  Guaifenesin (MUCINEX MAXIMUM STRENGTH) 1200 MG TB12 Take 1 tablet (1,200 mg total) by mouth 2 (two) times daily. 08/08/17   Maczis, Elmer Sow, PA-C  lidocaine (XYLOCAINE) 2 % solution Use as directed 20 mLs in the mouth or throat as needed for mouth pain. 08/06/17   Rise Mu, PA-C  lisinopril (PRINIVIL,ZESTRIL) 5 MG tablet Take 5 mg by mouth daily.  [provider]  meloxicam (MOBIC) 15 MG tablet Take 1 tablet (15 mg total) by mouth daily. Take 1 daily with food. 11/11/17   Arthor Captain, PA-C  metFORMIN (GLUCOPHAGE) 1000 MG tablet Take 1,000 mg by mouth 1 day or 1 dose.    [provider]  naproxen (NAPROSYN) 375 MG tablet Take 1 tablet (375 mg total) by mouth 2 (two) times daily. 01/07/18   Kinzey Sheriff, MD    Family History Family History  Problem Relation Age of Onset  . Alzheimer's disease Unknown   . Coronary artery disease Unknown   . Breast cancer Unknown   . Arthritis Unknown   . Hypertension Unknown   . Diabetes Unknown     Social History Social History     Tobacco Use  . Smoking status: Never Smoker  . Smokeless tobacco: Never Used  Substance Use Topics  . Alcohol use: No  . Drug use: No     Allergies   Patient has no known allergies.   Review of Systems Review of Systems  Constitutional: Negative for diaphoresis and fever.  Cardiovascular: Positive for leg swelling. Negative for chest pain.  Skin: Negative for color change.  Neurological: Negative for numbness.  All other systems reviewed and are negative.    Physical Exam Updated Vital Signs BP 119/85 (BP Location: Right Arm)   Pulse (!) 50   Temp 98.5 F (36.9 C) (Oral)   Resp 14   Ht 5\' 4"  (1.626 m)   Wt 79.4 kg (175 lb)   LMP  (LMP Unknown)   SpO2 100%   BMI 30.04 kg/m   Physical Exam  Constitutional: She is oriented to person, place, and time. She appears well-developed and well-nourished. No distress.  HENT:  Head: Normocephalic and atraumatic.  Eyes: Pupils are equal, round, and reactive to light. Conjunctivae are normal.  Neck: Normal range of motion. Neck supple.  Cardiovascular: Normal rate, regular rhythm, normal heart sounds and intact distal pulses.  Pulmonary/Chest: Effort normal and breath sounds normal. No stridor. She has no wheezes. She has no rales.  Abdominal: Soft. Bowel sounds are normal. She exhibits no mass. There is no tenderness. There is no rebound and no guarding.  Musculoskeletal: Normal range of motion.       Right ankle: Normal. Achilles tendon normal.       Left ankle: Normal. Achilles tendon normal.       Right lower leg: Normal. She exhibits no tenderness.       Left lower leg: Normal. She exhibits no tenderness.  Non pitting edema of the feet.  No warmth erythema or fluctuance.  Neurological: She is alert and oriented to person, place, and time. She displays normal reflexes.  Skin: Skin is warm and dry. Capillary refill takes less than 2 seconds. No erythema.  Psychiatric: She has a normal mood and affect.     ED  Treatments / Results  Labs (all labs ordered are listed, but only abnormal results are displayed) Labs Reviewed - No data to display  EKG None  Radiology No results found.  Procedures Procedures (including critical care time)  Medications Ordered in ED Medications  naproxen (NAPROSYN) tablet 500 mg (500 mg Oral Given 01/07/18 0446)       Final Clinical Impressions(s) / ED Diagnoses   Final diagnoses:  Leg swelling  Chronic pain of lower extremity, unspecified laterality   I ordered a doppler of the RLE.  I have counseled patient that she should follow up with  her PMD elevate her legs.    Return for weakness, numbness, changes in vision or speech, fevers >100.4 unrelieved by medication, shortness of breath, intractable vomiting, or diarrhea, abdominal pain, Inability to tolerate liquids or food, cough, altered mental status or any concerns. No signs of systemic illness or infection. The patient is nontoxic-appearing on exam and vital signs are within normal limits.   I have reviewed the triage vital signs and the nursing notes. Pertinent labs &imaging results that were available during my care of the patient were reviewed by me and considered in my medical decision making (see chart for details).  After history, exam, and medical workup I feel the patient has been appropriately medically screened and is safe for discharge home. Pertinent diagnoses were discussed with the patient. Patient was given return precautions.    ED Discharge Orders        Ordered    US Venous Img Lower Unilateral Left  Status:  Canceled     01/07/18 0442    naproxen (NAPROSYN) 375 MG tablet  2 times daily     01/07/18 0442    US Venous Img Lower Unilateral Right     01/07/18 0442       Kimba Lottes, MD 01/07/18 [phone]

## 2018-01-07 NOTE — ED Triage Notes (Signed)
Pt c/o right leg swollen and 10/10 pain since yesterday not able to sleep. Pt denies any injury.

## 2018-01-09 ENCOUNTER — Encounter (HOSPITAL_COMMUNITY): Payer: Self-pay | Admitting: Emergency Medicine

## 2018-01-09 ENCOUNTER — Other Ambulatory Visit: Payer: Self-pay

## 2018-01-09 ENCOUNTER — Emergency Department (HOSPITAL_COMMUNITY)
Admission: EM | Admit: 2018-01-09 | Discharge: 2018-01-09 | Disposition: A | Discharge status: Home / Self Care | Payer: Self-pay | Attending: Emergency Medicine | Admitting: Emergency Medicine

## 2018-01-09 DIAGNOSIS — I10 Essential (primary) hypertension: Secondary | ICD-10-CM | POA: Insufficient documentation

## 2018-01-09 DIAGNOSIS — Z7984 Long term (current) use of oral hypoglycemic drugs: Secondary | ICD-10-CM | POA: Insufficient documentation

## 2018-01-09 DIAGNOSIS — Z79899 Other long term (current) drug therapy: Secondary | ICD-10-CM | POA: Insufficient documentation

## 2018-01-09 DIAGNOSIS — E119 Type 2 diabetes mellitus without complications: Secondary | ICD-10-CM | POA: Insufficient documentation

## 2018-01-09 DIAGNOSIS — M7989 Other specified soft tissue disorders: Secondary | ICD-10-CM | POA: Insufficient documentation

## 2018-01-09 MED ORDER — FUROSEMIDE 20 MG PO TABS: 20.0000 mg | ORAL_TABLET | Freq: Every day | ORAL | 0 refills | Status: DC

## 2018-01-09 NOTE — Discharge Instructions (Addendum)
Take Lasix once daily for swelling Continue to elevate the leg at night and wear compression stockings Follow up with your doctor

## 2018-01-09 NOTE — ED Provider Notes (Signed)
[facility]-EMERGENCY DEPT Provider Note   CSN: [id number] Arrival date & time: 01/09/18  0443     History   Chief Complaint Chief Complaint  Patient presents with  . Leg Swelling    HPI Erin Craig is a 57 y.o. female who presents with L leg swelling. PMH significant for DM, HTN, HLD. She states that the swelling has been going on for two weeks but it has worsened over the past 2 days. She has been seen in the ED for this problem 6 times in the past couple months with no resolution. She has had a CT angio which did not have acute findings as well as several LE DVT studies. The last one on 5/31 showed 5.5cm Baker's cyst as well as a non-specific mixed echogenic fluid collection over the medial aspect of the right calf. She has tried medications, elevation, compression stockings with no relief. She denies fever but just has constant aching pain and paresthesias due to the swelling. She has an appt with her PCP but it's not for a couple weeks.  HPI  Past Medical History:  Diagnosis Date  . Bradycardia   . Diabetes mellitus without complication (HCC)   . Heart murmur   . Hypercholesterolemia   . Hypertension     There are no active problems to display for this patient.   Past Surgical History:  Procedure Laterality Date  . ABDOMINAL HYSTERECTOMY    . CESAREAN SECTION    . CHOLECYSTECTOMY    . FOOT SURGERY       OB History   None      Home Medications    Prior to Admission medications   Medication Sig Start Date End Date Taking? Authorizing Provider  acetaminophen (TYLENOL) 325 MG tablet Take 325 mg by mouth every 6 (six) hours as needed. Headache/pain    [provider]  atorvastatin (LIPITOR) 10 MG tablet Take 10 mg by mouth daily.    [provider]  diclofenac sodium (VOLTAREN) 1 % GEL Apply 4 g topically 4 (four) times daily. 12/28/17   Joy, Shawn C, PA-C  Diclofenac Sodium CR (VOLTAREN-XR) 100 MG 24 hr tablet Take 1 tablet  (100 mg total) by mouth daily. 12/04/17   Palumbo, April, MD  esomeprazole (NEXIUM) 20 MG capsule Take 20 mg by mouth daily before breakfast.      [provider]  furosemide (LASIX) 20 MG tablet Take 1 tablet (20 mg total) by mouth daily. 01/09/18   Bethel BornGekas, Erin Graig Marie, PA-C  glipiZIDE (GLUCOTROL) 5 MG tablet Take 10 mg by mouth daily before breakfast.    [provider]  lisinopril (PRINIVIL,ZESTRIL) 5 MG tablet Take 5 mg by mouth daily.    [provider]  meloxicam (MOBIC) 15 MG tablet Take 1 tablet (15 mg total) by mouth daily. Take 1 daily with food. 11/11/17   Arthor CaptainHarris, Abigail, PA-C  metFORMIN (GLUCOPHAGE) 1000 MG tablet Take 1,000 mg by mouth 1 day or 1 dose.    [provider]  naproxen (NAPROSYN) 375 MG tablet Take 1 tablet (375 mg total) by mouth 2 (two) times daily. 01/07/18   Palumbo, April, MD    Family History Family History  Problem Relation Age of Onset  . Alzheimer's disease Unknown   . Coronary artery disease Unknown   . Breast cancer Unknown   . Arthritis Unknown   . Hypertension Unknown   . Diabetes Unknown     Social History Social History   Tobacco Use  .  Smoking status: Never Smoker  . Smokeless tobacco: Never Used  Substance Use Topics  . Alcohol use: No  . Drug use: No     Allergies   Patient has no known allergies.   Review of Systems Review of Systems  Constitutional: Negative for fever.  Cardiovascular: Positive for leg swelling.  Skin: Negative for color change and wound.  Neurological: Negative for weakness.  All other systems reviewed and are negative.    Physical Exam Updated Vital Signs BP 114/82 (BP Location: Left Arm)   Pulse (!) 54   Temp 98 F (36.7 C) (Oral)   Resp 13   Ht 5\' 4"  (1.626 m)   Wt 79.4 kg (175 lb)   LMP  (LMP Unknown)   SpO2 100%   BMI 30.04 kg/m   Physical Exam  Constitutional: She is oriented to person, place, and time. She appears well-developed and well-nourished. No  distress.  HENT:  Head: Normocephalic and atraumatic.  Eyes: Pupils are equal, round, and reactive to light. Conjunctivae are normal. Right eye exhibits no discharge. Left eye exhibits no discharge. No scleral icterus.  Neck: Normal range of motion.  Cardiovascular: Normal rate.  Pulmonary/Chest: Effort normal. No respiratory distress.  Abdominal: She exhibits no distension.  Musculoskeletal:  Left leg: Moderate edema of the left leg from the foot to the knee. No redness or warmth. Ambulatory. 2+ DP pulse.   Neurological: She is alert and oriented to person, place, and time.  Skin: Skin is warm and dry.  Psychiatric: She has a normal mood and affect. Her behavior is normal.  Nursing note and vitals reviewed.    ED Treatments / Results  Labs (all labs ordered are listed, but only abnormal results are displayed) Labs Reviewed - No data to display  EKG None  Radiology US Venous Img Lower Unilateral Right  Result Date: 01/07/2018 CLINICAL DATA:  Right lower extremity pain and edema for the past several weeks. Evaluate for DVT. EXAM: RIGHT LOWER EXTREMITY VENOUS DOPPLER ULTRASOUND TECHNIQUE: Gray-scale sonography with graded compression, as well as color Doppler and duplex ultrasound were performed to evaluate the lower extremity deep venous systems from the level of the common femoral vein and including the common femoral, femoral, profunda femoral, popliteal and calf veins including the posterior tibial, peroneal and gastrocnemius veins when visible. The superficial great saphenous vein was also interrogated. Spectral Doppler was utilized to evaluate flow at rest and with distal augmentation maneuvers in the common femoral, femoral and popliteal veins. COMPARISON:  None. FINDINGS: Contralateral Common Femoral Vein: Respiratory phasicity is normal and symmetric with the symptomatic side. No evidence of thrombus. Normal compressibility. Common Femoral Vein: No evidence of thrombus. Normal  compressibility, respiratory phasicity and response to augmentation. Saphenofemoral Junction: No evidence of thrombus. Normal compressibility and flow on color Doppler imaging. Profunda Femoral Vein: No evidence of thrombus. Normal compressibility and flow on color Doppler imaging. Femoral Vein: No evidence of thrombus. Normal compressibility, respiratory phasicity and response to augmentation. Popliteal Vein: No evidence of thrombus. Normal compressibility, respiratory phasicity and response to augmentation. Calf Veins: No evidence of thrombus. Normal compressibility and flow on color Doppler imaging. Superficial Great Saphenous Vein: No evidence of thrombus. Normal compressibility. Venous Reflux:  None. Other Findings: There is a minimal subcutaneous edema at the level of the right calf. Note is made of a serpiginous approximately 5.5 x 1.6 x 4.1 cm fluid collection within the right popliteal fossa compatible with a Baker cyst. Note is made of a approximately 8.3  x 2.0 x 5.3 cm mixed echogenic apparent fluid collection within the subcutaneous tissues of the medial aspect of the right calf (images 37 through 48). IMPRESSION: 1. No evidence of DVT within the right lower extremity. 2. Approximately 5.5 cm right-sided Baker's cyst. 3. There is an additional approximately 8.3 cm mixed echogenic fluid collection within the subcutaneous tissues of the medial aspect of the right calf, nonspecific with differential considerations including a portion of a complex leaking Baker cyst, hematoma, seroma and abscess. Clinical correlation is advised. Electronically Signed   By: Simonne Come M.D.   On: 01/07/2018 10:07    Procedures Procedures (including critical care time)  Medications Ordered in ED Medications - No data to display   Initial Impression / Assessment and Plan / ED Course  I have reviewed the triage vital signs and the nursing notes.  Pertinent labs & imaging results that were available during my care of  the patient were reviewed by me and considered in my medical decision making (see chart for details).  57 year old female presents with atraumatic leg swelling that has been going on for months. She has had an extensive work up for this and the only finding is that she may have a fluid collection in the L leg, new since the last imaging was done in April. She has tried multiple remedies including conservative therapies with no relief. She has not tried a diuretic but states that she has had this in the past when she's had swelling in the legs which has helped. She has an appointment with her PCP coming up in a couple weeks. We will try Lasix 20mg  and have her continue conservative therapies.   Final Clinical Impressions(s) / ED Diagnoses   Final diagnoses:  Left leg swelling    ED Discharge Orders        Ordered    furosemide (LASIX) 20 MG tablet  Daily     01/09/18 0615       Bethel Born, PA-C 01/09/18 1478    Zadie Rhine, MD 01/10/18 [phone]

## 2018-01-09 NOTE — ED Triage Notes (Signed)
Patient complaining of right swollen leg. Patient denies any injury. Patient was seen on 01/07/2018 for the same complaint.

## 2018-06-10 IMAGING — CR DG TIBIA/FIBULA 2V*L*
4 series · 4 of 4 positions shown · non-contrast
Comparison: None available

CLINICAL DATA: Left lower leg swelling and redness

EXAM:
LEFT TIBIA AND FIBULA - 2 VIEW

[t tib/fib ap left (1 of 2)]
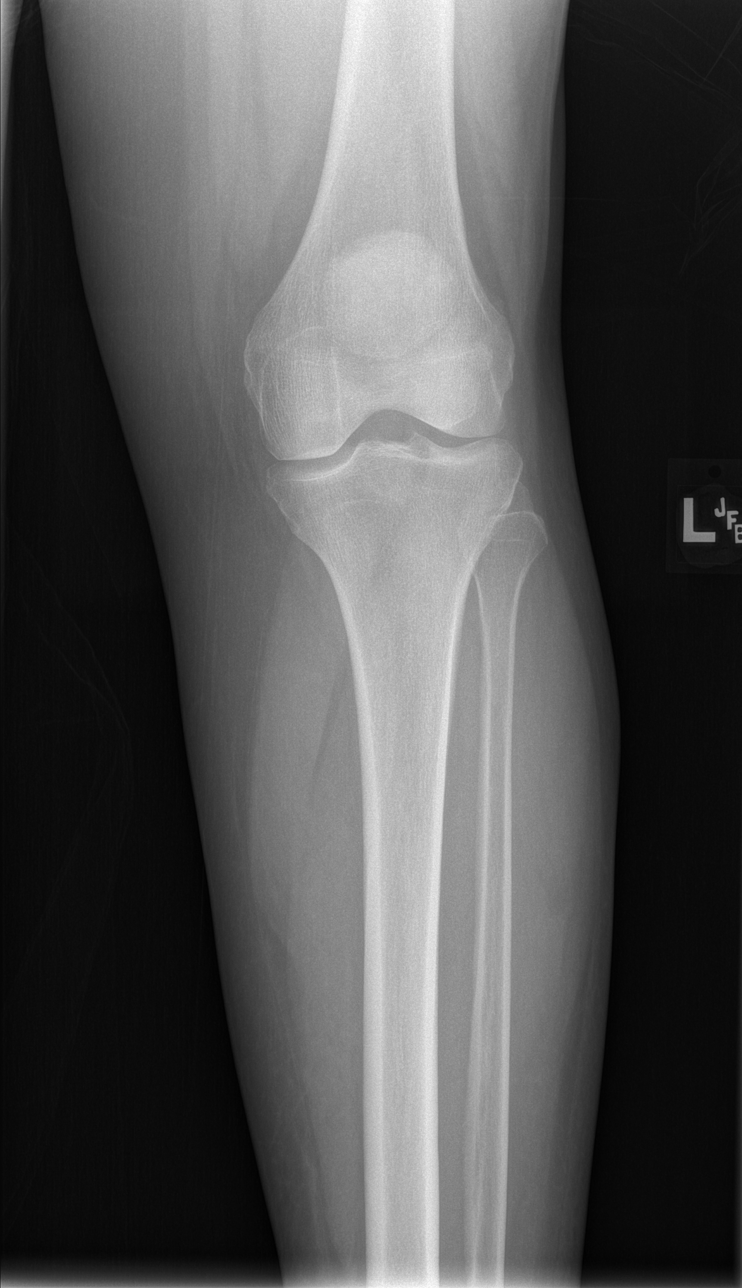

[t tib/fib ap left (2 of 2)]
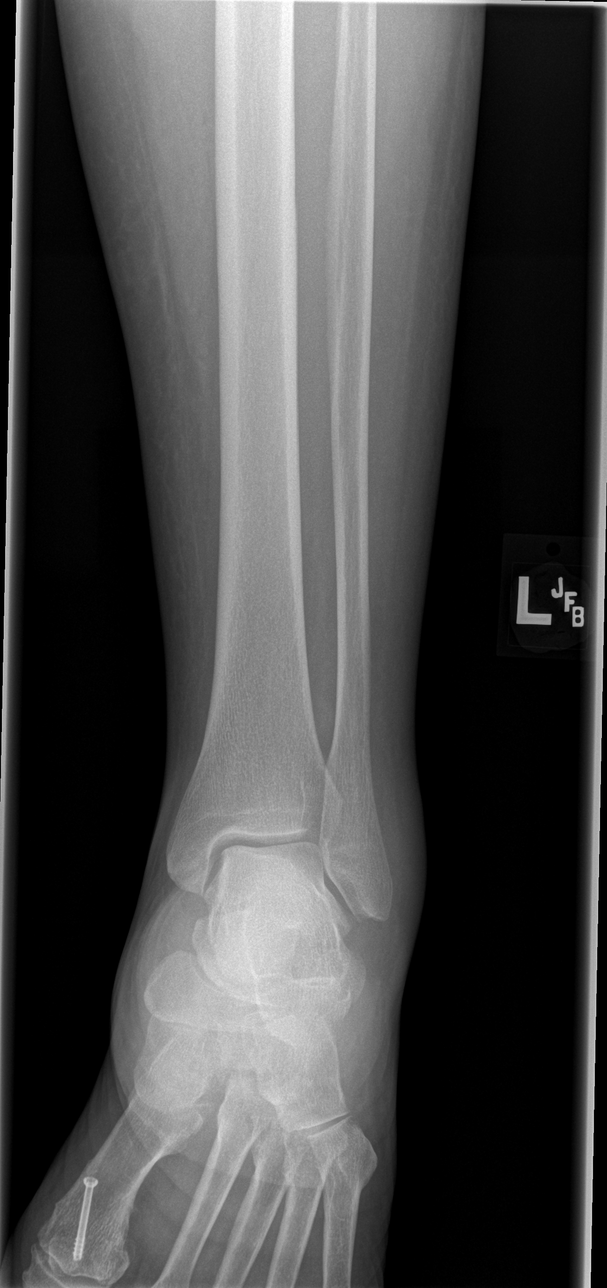

[t tib/fib lat left (1 of 2)]
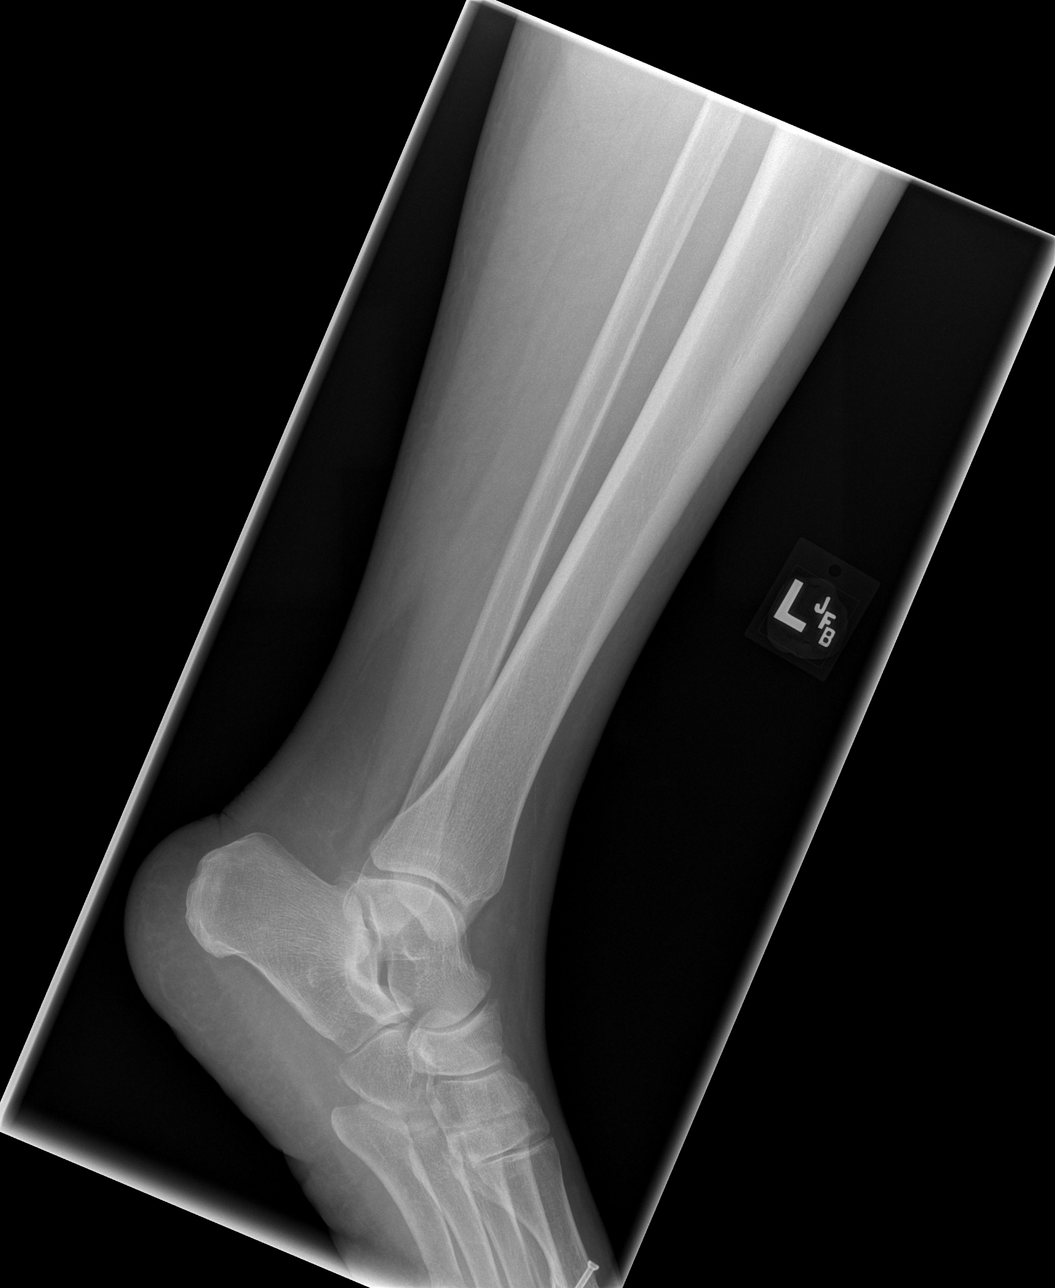

[t tib/fib lat left (2 of 2)]
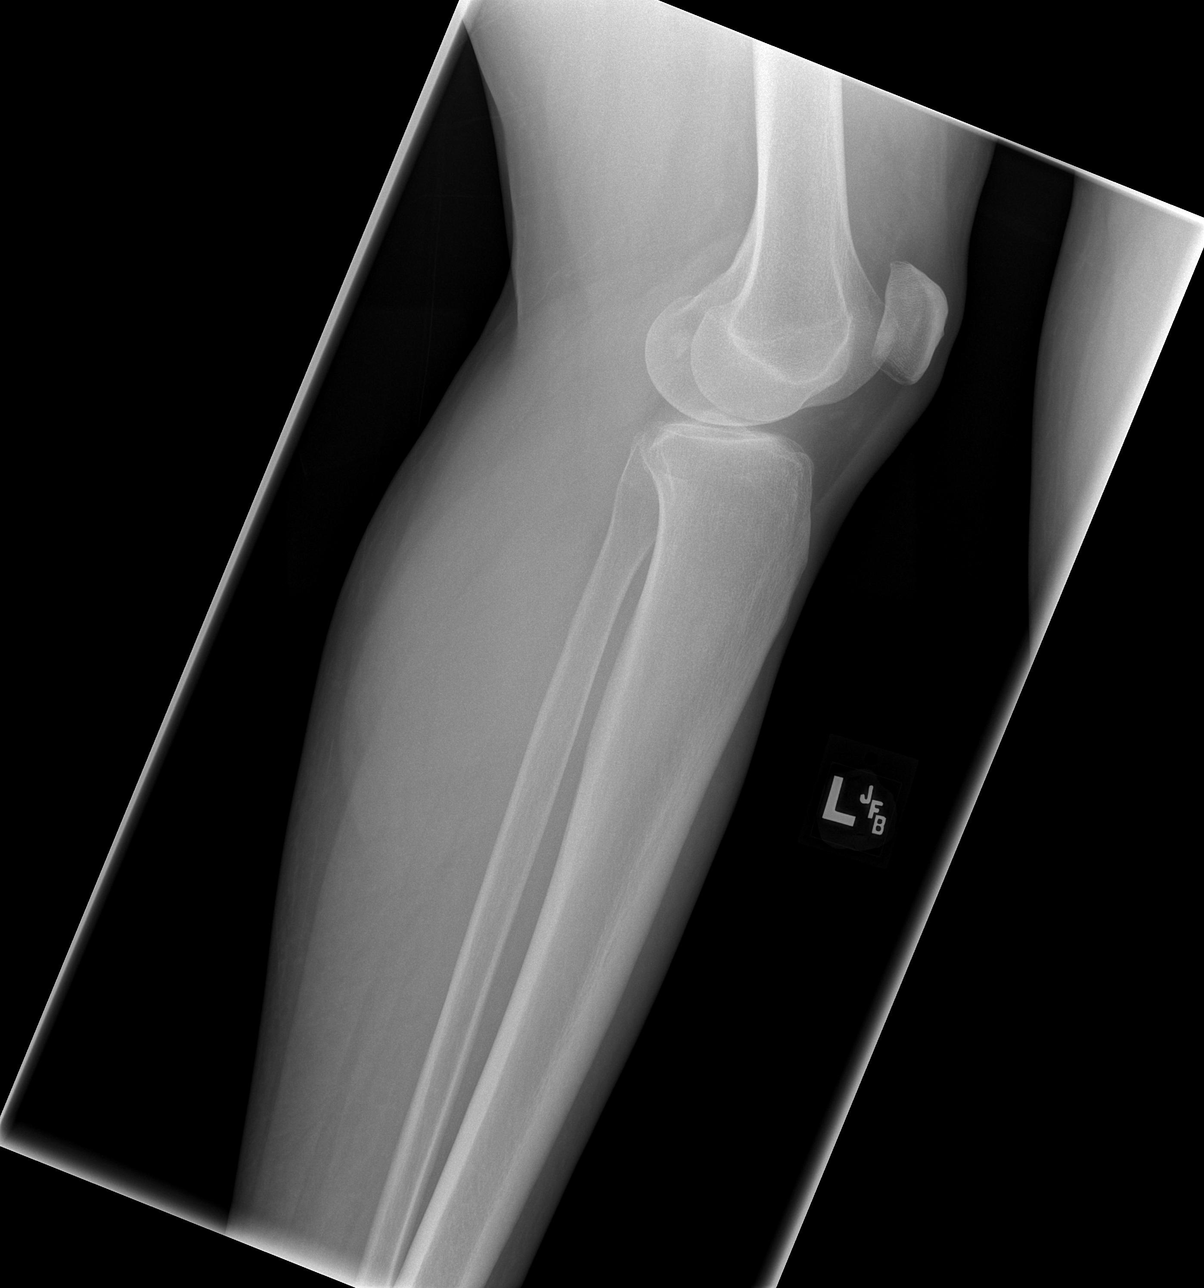

[4 of 4 positions shown; findings below may reference images not displayed]

FINDINGS: Mild left lower extremity subcutaneous edema noted. Normal alignment
without acute osseous finding or fracture. Single operative screw in
the left first metatarsal distally.
IMPRESSION: No acute osseous finding

## 2018-07-31 ENCOUNTER — Emergency Department (HOSPITAL_BASED_OUTPATIENT_CLINIC_OR_DEPARTMENT_OTHER)
Admission: EM | Admit: 2018-07-31 | Discharge: 2018-08-01 | Disposition: A | Discharge status: Home / Self Care | Payer: Self-pay | Attending: Emergency Medicine | Admitting: Emergency Medicine

## 2018-07-31 ENCOUNTER — Other Ambulatory Visit: Payer: Self-pay

## 2018-07-31 ENCOUNTER — Encounter (HOSPITAL_BASED_OUTPATIENT_CLINIC_OR_DEPARTMENT_OTHER): Payer: Self-pay | Admitting: *Deleted

## 2018-07-31 DIAGNOSIS — I10 Essential (primary) hypertension: Secondary | ICD-10-CM | POA: Insufficient documentation

## 2018-07-31 DIAGNOSIS — M25561 Pain in right knee: Secondary | ICD-10-CM | POA: Insufficient documentation

## 2018-07-31 DIAGNOSIS — Z7984 Long term (current) use of oral hypoglycemic drugs: Secondary | ICD-10-CM | POA: Insufficient documentation

## 2018-07-31 DIAGNOSIS — E119 Type 2 diabetes mellitus without complications: Secondary | ICD-10-CM | POA: Insufficient documentation

## 2018-07-31 DIAGNOSIS — G8929 Other chronic pain: Secondary | ICD-10-CM | POA: Insufficient documentation

## 2018-07-31 NOTE — ED Triage Notes (Signed)
Right knee pain without known injury x couple of weeks. Ambulatory.

## 2018-08-01 ENCOUNTER — Emergency Department (HOSPITAL_BASED_OUTPATIENT_CLINIC_OR_DEPARTMENT_OTHER): Payer: Self-pay

## 2018-08-01 ENCOUNTER — Other Ambulatory Visit: Payer: Self-pay

## 2018-08-01 MED ORDER — MELOXICAM 15 MG PO TABS: 15.0000 mg | ORAL_TABLET | Freq: Every day | ORAL | 0 refills | Status: DC

## 2018-08-01 MED ORDER — KETOROLAC TROMETHAMINE 30 MG/ML IJ SOLN
30.0000 mg | Freq: Once | INTRAMUSCULAR | Status: AC
  Administered 2018-08-01: 30 mg via INTRAMUSCULAR
  Filled 2018-08-01: qty 1

## 2018-08-01 NOTE — Discharge Instructions (Signed)
You were seen today for knee pain.  This is chronic in nature.  Continue anti-inflammatories.  We will start you back on meloxicam.  You need to follow-up with your orthopedist or sports medicine.  You may benefit from steroid injection.

## 2018-08-01 NOTE — ED Provider Notes (Signed)
[facility] EMERGENCY DEPARTMENT Provider Note   CSN: [id number] Arrival date & time: 07/31/18  2348     History   Chief Complaint Chief Complaint  Patient presents with  . Knee Pain    HPI Erin Craig is a 57 y.o. female.  HPI  This 57 year old female with a history of diabetes, hypertension, hypercholesterolemia who presents with right knee pain.  Patient reports acute on chronic right knee pain.  She has had issues with her right knee in the past.  She has seen an orthopedist and had injections.  She states over the last several weeks she has had increasing right knee pain.  It is worse with ambulation and she reports difficulty getting up from sitting secondary to pain.  She rates her pain at 8 out of 10.  She has been taking ibuprofen with minimal relief.  Denies numbness or tingling in the foot.  Denies new injury.  Denies fever or infectious symptoms.  No known history of gout.  Past Medical History:  Diagnosis Date  . Bradycardia   . Diabetes mellitus without complication (HCC)   . Heart murmur   . Hypercholesterolemia   . Hypertension     There are no active problems to display for this patient.   Past Surgical History:  Procedure Laterality Date  . ABDOMINAL HYSTERECTOMY    . CESAREAN SECTION    . CHOLECYSTECTOMY    . FOOT SURGERY       OB History   No obstetric history on file.      Home Medications    Prior to Admission medications   Medication Sig Start Date End Date Taking? Authorizing Provider  acetaminophen (TYLENOL) 325 MG tablet Take 325 mg by mouth every 6 (six) hours as needed. Headache/pain    [provider]  atorvastatin (LIPITOR) 10 MG tablet Take 10 mg by mouth daily.    [provider]  diclofenac sodium (VOLTAREN) 1 % GEL Apply 4 g topically 4 (four) times daily. 12/28/17   Joy, Shawn C, PA-C  Diclofenac Sodium CR (VOLTAREN-XR) 100 MG 24 hr tablet Take 1 tablet (100 mg total) by mouth daily. 12/04/17    Palumbo, April, MD  esomeprazole (NEXIUM) 20 MG capsule Take 20 mg by mouth daily before breakfast.      [provider]  furosemide (LASIX) 20 MG tablet Take 1 tablet (20 mg total) by mouth daily. 01/09/18   Bethel BornGekas, Kelly Marie, PA-C  glipiZIDE (GLUCOTROL) 5 MG tablet Take 10 mg by mouth daily before breakfast.    [provider]  lisinopril (PRINIVIL,ZESTRIL) 5 MG tablet Take 5 mg by mouth daily.    [provider]  meloxicam (MOBIC) 15 MG tablet Take 1 tablet (15 mg total) by mouth daily. Take 1 daily with food. 08/01/18   Mozes Sagar, Mayer Maskerourtney F, MD  metFORMIN (GLUCOPHAGE) 1000 MG tablet Take 1,000 mg by mouth 1 day or 1 dose.    [provider]  naproxen (NAPROSYN) 375 MG tablet Take 1 tablet (375 mg total) by mouth 2 (two) times daily. 01/07/18   Palumbo, April, MD    Family History Family History  Problem Relation Age of Onset  . Alzheimer's disease Unknown   . Coronary artery disease Unknown   . Breast cancer Unknown   . Arthritis Unknown   . Hypertension Unknown   . Diabetes Unknown     Social History Social History   Tobacco Use  . Smoking status: Never Smoker  . Smokeless tobacco:  Never Used  Substance Use Topics  . Alcohol use: No  . Drug use: No     Allergies   Patient has no known allergies.   Review of Systems Review of Systems  Constitutional: Negative for chills and fever.  Musculoskeletal: Negative for joint swelling.       Knee pain  Skin: Negative for color change.  Neurological: Negative for weakness and numbness.  All other systems reviewed and are negative.    Physical Exam Updated Vital Signs BP 120/81 (BP Location: Right Arm)   Pulse (!) 58   Temp 98.1 F (36.7 C) (Oral)   Resp 18   Ht 1.626 m (5\' 4" )   Wt 79.4 kg   LMP  (LMP Unknown)   SpO2 98%   BMI 30.04 kg/m   Physical Exam Vitals signs and nursing note reviewed.  Constitutional:      Appearance: She is well-developed. She is not ill-appearing.    HENT:     Head: Normocephalic and atraumatic.  Cardiovascular:     Rate and Rhythm: Normal rate and regular rhythm.  Pulmonary:     Effort: Pulmonary effort is normal. No respiratory distress.  Musculoskeletal:     Comments: Range of motion of the right knee, no significant effusion noted, tenderness to palpation over the medial joint line, no joint laxity noted, no overlying erythema or warmth, 2+ DP pulse, neurovascular intact distally  Skin:    General: Skin is warm and dry.  Neurological:     Mental Status: She is alert and oriented to person, place, and time.      ED Treatments / Results  Labs (all labs ordered are listed, but only abnormal results are displayed) Labs Reviewed - No data to display  EKG None  Radiology Dg Knee Complete 4 Views Right  Result Date: 08/01/2018 CLINICAL DATA:  Right knee pain for 2 weeks, no known injury, initial encounter EXAM: RIGHT KNEE - COMPLETE 4+ VIEW COMPARISON:  None. FINDINGS: Tricompartmental degenerative changes are noted most marked in the medial joint space. No joint effusion is seen. No acute fracture or dislocation is noted. IMPRESSION: Degenerative change without acute abnormality noted. Electronically Signed   By: Alcide Clever M.D.   On: 08/01/2018 00:31    Procedures Procedures (including critical care time)  Medications Ordered in ED Medications  ketorolac (TORADOL) 30 MG/ML injection 30 mg (has no administration in time range)     Initial Impression / Assessment and Plan / ED Course  I have reviewed the triage vital signs and the nursing notes.  Pertinent labs & imaging results that were available during my care of the patient were reviewed by me and considered in my medical decision making (see chart for details).     Patient presents with acute on chronic right knee pain.  She is overall nontoxic and vital signs are reassuring.  Denies infectious symptoms.  Low suspicion at this time for septic joint.  She  denies any injury.  X-rays are only notable for degenerative changes.  Patient was given Toradol.  We will reinitiate meloxicam as she has tolerated this in the past.  Recommend follow-up with her orthopedist or sports medicine to discuss further treatment for likely degenerative and arthritic changes.  After history, exam, and medical workup I feel the patient has been appropriately medically screened and is safe for discharge home. Pertinent diagnoses were discussed with the patient. Patient was given return precautions.   Final Clinical Impressions(s) / ED Diagnoses  Final diagnoses:  Chronic pain of right knee    ED Discharge Orders         Ordered    meloxicam (MOBIC) 15 MG tablet  Daily     08/01/18 0035           Shon BatonHorton, Unique Searfoss F, MD 08/01/18 0045

## 2018-09-10 ENCOUNTER — Other Ambulatory Visit: Payer: Self-pay

## 2018-09-10 ENCOUNTER — Encounter (HOSPITAL_BASED_OUTPATIENT_CLINIC_OR_DEPARTMENT_OTHER): Payer: Self-pay | Admitting: Emergency Medicine

## 2018-09-10 ENCOUNTER — Emergency Department (HOSPITAL_BASED_OUTPATIENT_CLINIC_OR_DEPARTMENT_OTHER): Payer: Self-pay

## 2018-09-10 ENCOUNTER — Emergency Department (HOSPITAL_BASED_OUTPATIENT_CLINIC_OR_DEPARTMENT_OTHER)
Admission: EM | Admit: 2018-09-10 | Discharge: 2018-09-10 | Disposition: A | Discharge status: Home / Self Care | Payer: Self-pay | Attending: Emergency Medicine | Admitting: Emergency Medicine

## 2018-09-10 DIAGNOSIS — Z7984 Long term (current) use of oral hypoglycemic drugs: Secondary | ICD-10-CM | POA: Insufficient documentation

## 2018-09-10 DIAGNOSIS — Z79899 Other long term (current) drug therapy: Secondary | ICD-10-CM | POA: Insufficient documentation

## 2018-09-10 DIAGNOSIS — I10 Essential (primary) hypertension: Secondary | ICD-10-CM | POA: Insufficient documentation

## 2018-09-10 DIAGNOSIS — E119 Type 2 diabetes mellitus without complications: Secondary | ICD-10-CM | POA: Insufficient documentation

## 2018-09-10 DIAGNOSIS — J209 Acute bronchitis, unspecified: Secondary | ICD-10-CM | POA: Insufficient documentation

## 2018-09-10 LAB — GROUP A STREP BY PCR: Group A Strep by PCR: NOT DETECTED

## 2018-09-10 MED ORDER — ALBUTEROL SULFATE HFA 108 (90 BASE) MCG/ACT IN AERS
2.0000 | INHALATION_SPRAY | Freq: Once | RESPIRATORY_TRACT | Status: AC
  Administered 2018-09-10: 2 via RESPIRATORY_TRACT
  Filled 2018-09-10: qty 6.7

## 2018-09-10 MED ORDER — GUAIFENESIN-CODEINE 100-10 MG/5ML PO SOLN: 10.0000 mL | Freq: Three times a day (TID) | ORAL | 0 refills | Status: DC | PRN

## 2018-09-10 MED ORDER — AZITHROMYCIN 250 MG PO TABS
500.0000 mg | ORAL_TABLET | Freq: Once | ORAL | Status: AC
  Administered 2018-09-10: 500 mg via ORAL
  Filled 2018-09-10: qty 2

## 2018-09-10 MED ORDER — AZITHROMYCIN 250 MG PO TABS: 250.0000 mg | ORAL_TABLET | Freq: Every day | ORAL | 0 refills | Status: DC

## 2018-09-10 NOTE — ED Triage Notes (Signed)
Pt states for three days she has been having runny nose with yellow discharge, headache, body chills, and ache. Denies fevers, nausea, or vomiting.

## 2018-09-10 NOTE — ED Provider Notes (Signed)
[facility] EMERGENCY DEPARTMENT Provider Note   CSN: [id number] Arrival date & time: 09/10/18  [id number]     History   Chief Complaint Chief Complaint  Patient presents with  . Cough  . Generalized Body Aches    HPI Erin Craig is a 58 y.o. female.  She is presenting with complaint of about 3 to 4 days of runny nose sore throat cough body aches.  She rates the throat pain is moderate in severity sharp and stabbing worsening with cough.  No fevers or chills no nausea no vomiting.  She is tried some over-the-counter medications without any relief.  She had a flu shot this year.  The history is provided by the patient.  Cough  Cough characteristics:  Non-productive Sputum characteristics:  Nondescript Severity:  Moderate Onset quality:  Gradual Timing:  Intermittent Progression:  Unchanged Chronicity:  New Smoker: no   Context: upper respiratory infection   Relieved by:  Nothing Worsened by:  Nothing Ineffective treatments:  Cough suppressants Associated symptoms: headaches, myalgias, rhinorrhea, shortness of breath, sinus congestion and sore throat   Associated symptoms: no chest pain, no chills, no fever and no rash     Past Medical History:  Diagnosis Date  . Bradycardia   . Diabetes mellitus without complication (HCC)   . Heart murmur   . Hypercholesterolemia   . Hypertension     There are no active problems to display for this patient.   Past Surgical History:  Procedure Laterality Date  . ABDOMINAL HYSTERECTOMY    . CESAREAN SECTION    . CHOLECYSTECTOMY    . FOOT SURGERY       OB History   No obstetric history on file.      Home Medications    Prior to Admission medications   Medication Sig Start Date End Date Taking? Authorizing Provider  acetaminophen (TYLENOL) 325 MG tablet Take 325 mg by mouth every 6 (six) hours as needed. Headache/pain    [provider]  atorvastatin (LIPITOR) 10 MG tablet Take 10 mg by mouth daily.     [provider]  diclofenac sodium (VOLTAREN) 1 % GEL Apply 4 g topically 4 (four) times daily. 12/28/17   Joy, Shawn C, PA-C  Diclofenac Sodium CR (VOLTAREN-XR) 100 MG 24 hr tablet Take 1 tablet (100 mg total) by mouth daily. 12/04/17   Palumbo, April, MD  esomeprazole (NEXIUM) 20 MG capsule Take 20 mg by mouth daily before breakfast.      [provider]  furosemide (LASIX) 20 MG tablet Take 1 tablet (20 mg total) by mouth daily. 01/09/18   Bethel BornGekas, Kelly Marie, PA-C  glipiZIDE (GLUCOTROL) 5 MG tablet Take 10 mg by mouth daily before breakfast.    [provider]  lisinopril (PRINIVIL,ZESTRIL) 5 MG tablet Take 5 mg by mouth daily.    [provider]  meloxicam (MOBIC) 15 MG tablet Take 1 tablet (15 mg total) by mouth daily. Take 1 daily with food. 08/01/18   Horton, Mayer Maskerourtney F, MD  metFORMIN (GLUCOPHAGE) 1000 MG tablet Take 1,000 mg by mouth 1 day or 1 dose.    [provider]  naproxen (NAPROSYN) 375 MG tablet Take 1 tablet (375 mg total) by mouth 2 (two) times daily. 01/07/18   Palumbo, April, MD    Family History Family History  Problem Relation Age of Onset  . Alzheimer's disease Other   . Coronary artery disease Other   . Breast cancer Other   . Arthritis Other   .  Hypertension Other   . Diabetes Other     Social History Social History   Tobacco Use  . Smoking status: Never Smoker  . Smokeless tobacco: Never Used  Substance Use Topics  . Alcohol use: No  . Drug use: No     Allergies   Patient has no known allergies.   Review of Systems Review of Systems  Constitutional: Negative for chills and fever.  HENT: Positive for rhinorrhea and sore throat.   Eyes: Negative for redness.  Respiratory: Positive for cough and shortness of breath.   Cardiovascular: Negative for chest pain.  Gastrointestinal: Negative for abdominal pain.  Genitourinary: Negative for dysuria.  Musculoskeletal: Positive for myalgias.  Skin: Negative for  rash.  Neurological: Positive for headaches.     Physical Exam Updated Vital Signs BP 135/82   Pulse 63   Temp 98.2 F (36.8 C) (Oral)   Resp 20   Ht 5\' 4"  (1.626 m)   Wt 81.2 kg   LMP  (LMP Unknown)   SpO2 100%   BMI 30.73 kg/m   Physical Exam Vitals signs and nursing note reviewed.  Constitutional:      General: She is not in acute distress.    Appearance: She is well-developed.  HENT:     Head: Normocephalic and atraumatic.     Mouth/Throat:     Mouth: Mucous membranes are moist.     Pharynx: Oropharynx is clear. Posterior oropharyngeal erythema present. No oropharyngeal exudate.  Eyes:     Conjunctiva/sclera: Conjunctivae normal.  Neck:     Musculoskeletal: Neck supple.  Cardiovascular:     Rate and Rhythm: Normal rate and regular rhythm.     Heart sounds: No murmur.  Pulmonary:     Effort: Pulmonary effort is normal. No respiratory distress.     Breath sounds: Normal breath sounds.  Abdominal:     Palpations: Abdomen is soft.     Tenderness: There is no abdominal tenderness.  Musculoskeletal:        General: No tenderness or signs of injury.  Skin:    General: Skin is warm and dry.     Capillary Refill: Capillary refill takes less than 2 seconds.  Neurological:     General: No focal deficit present.     Mental Status: She is alert.      ED Treatments / Results  Labs (all labs ordered are listed, but only abnormal results are displayed) Labs Reviewed  GROUP A STREP BY PCR    EKG None  Radiology Dg Chest 2 View  Result Date: 09/10/2018 CLINICAL DATA:  Cough. EXAM: CHEST - 2 VIEW COMPARISON:  Radiographs of August 08, 2017. FINDINGS: The heart size and mediastinal contours are within normal limits. Both lungs are clear. The visualized skeletal structures are unremarkable. IMPRESSION: No active cardiopulmonary disease. Electronically Signed   By: Lupita RaiderJames  Green Jr, M.D.   On: 09/10/2018 07:23    Procedures Procedures (including critical care  time)  Medications Ordered in ED Medications  albuterol (PROVENTIL HFA;VENTOLIN HFA) 108 (90 Base) MCG/ACT inhaler 2 puff (2 puffs Inhalation Given 09/10/18 0758)  azithromycin (ZITHROMAX) tablet 500 mg (500 mg Oral Given 09/10/18 0754)     Initial Impression / Assessment and Plan / ED Course  I have reviewed the triage vital signs and the nursing notes.  Pertinent labs & imaging results that were available during my care of the patient were reviewed by me and considered in my medical decision making (see chart for details).  Final Clinical Impressions(s) / ED Diagnoses   Final diagnoses:  Acute bronchitis, unspecified organism    ED Discharge Orders         Ordered    guaiFENesin-codeine 100-10 MG/5ML syrup  3 times daily PRN     09/10/18 0754    azithromycin (ZITHROMAX) 250 MG tablet  Daily     09/10/18 0754           Terrilee Files, MD 09/10/18 0800

## 2018-09-10 NOTE — Discharge Instructions (Addendum)
You were seen in the emergency department for cough sore throat runny nose.  This is likely an upper respiratory infection and possibly viral.  We are starting you on some antibiotics and some cough medicine.  Please use the inhaler 2 puffs every 4 hours as needed for chest congestion.  Follow-up with your doctor and return if any worsening symptoms.

## 2018-10-06 ENCOUNTER — Encounter (HOSPITAL_BASED_OUTPATIENT_CLINIC_OR_DEPARTMENT_OTHER): Payer: Self-pay | Admitting: Emergency Medicine

## 2018-10-06 ENCOUNTER — Emergency Department (HOSPITAL_BASED_OUTPATIENT_CLINIC_OR_DEPARTMENT_OTHER)
Admission: EM | Admit: 2018-10-06 | Discharge: 2018-10-06 | Disposition: A | Discharge status: Home / Self Care | Payer: Self-pay | Attending: Emergency Medicine | Admitting: Emergency Medicine

## 2018-10-06 ENCOUNTER — Other Ambulatory Visit: Payer: Self-pay

## 2018-10-06 DIAGNOSIS — B9789 Other viral agents as the cause of diseases classified elsewhere: Secondary | ICD-10-CM | POA: Insufficient documentation

## 2018-10-06 DIAGNOSIS — J069 Acute upper respiratory infection, unspecified: Secondary | ICD-10-CM

## 2018-10-06 DIAGNOSIS — I10 Essential (primary) hypertension: Secondary | ICD-10-CM | POA: Insufficient documentation

## 2018-10-06 DIAGNOSIS — Z79899 Other long term (current) drug therapy: Secondary | ICD-10-CM | POA: Insufficient documentation

## 2018-10-06 DIAGNOSIS — E119 Type 2 diabetes mellitus without complications: Secondary | ICD-10-CM | POA: Insufficient documentation

## 2018-10-06 DIAGNOSIS — Z7984 Long term (current) use of oral hypoglycemic drugs: Secondary | ICD-10-CM | POA: Insufficient documentation

## 2018-10-06 MED ORDER — BENZONATATE 100 MG PO CAPS: 100.0000 mg | ORAL_CAPSULE | Freq: Three times a day (TID) | ORAL | 0 refills | Status: DC

## 2018-10-06 MED ORDER — DEXAMETHASONE 6 MG PO TABS
10.0000 mg | ORAL_TABLET | Freq: Once | ORAL | Status: AC
  Administered 2018-10-06: 10 mg via ORAL
  Filled 2018-10-06: qty 1

## 2018-10-06 MED ORDER — HYDROCOD POLST-CPM POLST ER 10-8 MG/5ML PO SUER: 5.0000 mL | Freq: Every evening | ORAL | 0 refills | Status: DC | PRN

## 2018-10-06 MED ORDER — ALBUTEROL SULFATE HFA 108 (90 BASE) MCG/ACT IN AERS
2.0000 | INHALATION_SPRAY | Freq: Once | RESPIRATORY_TRACT | Status: AC
  Administered 2018-10-06: 2 via RESPIRATORY_TRACT

## 2018-10-06 MED ORDER — ALBUTEROL SULFATE (2.5 MG/3ML) 0.083% IN NEBU
5.0000 mg | INHALATION_SOLUTION | Freq: Once | RESPIRATORY_TRACT | Status: AC
  Administered 2018-10-06: 5 mg via RESPIRATORY_TRACT
  Filled 2018-10-06: qty 6

## 2018-10-06 MED ORDER — IPRATROPIUM BROMIDE 0.02 % IN SOLN
0.5000 mg | Freq: Once | RESPIRATORY_TRACT | Status: AC
Start: 2018-10-06 — End: 2018-10-06
  Administered 2018-10-06: 0.5 mg via RESPIRATORY_TRACT
  Filled 2018-10-06: qty 2.5

## 2018-10-06 NOTE — ED Notes (Signed)
Unable to obtain esignature as pad is not working.

## 2018-10-06 NOTE — ED Provider Notes (Signed)
[facility] EMERGENCY DEPARTMENT Provider Note   CSN: [id number] Arrival date & time: 10/06/18  0543    History   Chief Complaint Chief Complaint  Patient presents with  . Cough    HPI Erin Craig is a 58 y.o. female.     The history is provided by the patient.  Cough  Cough characteristics:  Non-productive Severity:  Moderate Onset quality:  Gradual Timing:  Intermittent Progression:  Worsening Chronicity:  Recurrent Smoker: no   Relieved by:  Nothing Worsened by:  Nothing Associated symptoms: chills, ear pain, rhinorrhea, sinus congestion and sore throat   Associated symptoms: no fever and no shortness of breath   Associated symptoms comment:  Chest soreness with cough Risk factors: no recent travel   Patient reports she has been coughing for a month.  She was seen earlier in February for cough and given antibiotics and cough syrup.  She had some improvement in her cough but it never resolved.  Over the past several days she has had increasing cough worse at night.  She reports it is preventing her from sleeping.  She also reports feeling that she may have a sinus infection with sinus congestion pain. No fever.  No hemoptysis.  She does report chest soreness with cough. No shortness of breath is reported  Past Medical History:  Diagnosis Date  . Bradycardia   . Diabetes mellitus without complication (HCC)   . Heart murmur   . Hypercholesterolemia   . Hypertension     There are no active problems to display for this patient.   Past Surgical History:  Procedure Laterality Date  . ABDOMINAL HYSTERECTOMY    . CESAREAN SECTION    . CHOLECYSTECTOMY    . FOOT SURGERY       OB History   No obstetric history on file.      Home Medications    Prior to Admission medications   Medication Sig Start Date End Date Taking? Authorizing Provider  metFORMIN (GLUCOPHAGE) 1000 MG tablet Take 1,000 mg by mouth 1 day or 1 dose.   Yes [provider]  acetaminophen (TYLENOL) 325 MG tablet Take 325 mg by mouth every 6 (six) hours as needed. Headache/pain    [provider]  atorvastatin (LIPITOR) 10 MG tablet Take 10 mg by mouth daily.    [provider]  diclofenac sodium (VOLTAREN) 1 % GEL Apply 4 g topically 4 (four) times daily. 12/28/17   Joy, Shawn C, PA-C  Diclofenac Sodium CR (VOLTAREN-XR) 100 MG 24 hr tablet Take 1 tablet (100 mg total) by mouth daily. 12/04/17   Palumbo, April, MD  furosemide (LASIX) 20 MG tablet Take 1 tablet (20 mg total) by mouth daily. 01/09/18   Bethel Born, PA-C  glipiZIDE (GLUCOTROL) 5 MG tablet Take 10 mg by mouth daily before breakfast.    [provider]  lisinopril (PRINIVIL,ZESTRIL) 5 MG tablet Take 5 mg by mouth daily.    [provider]    Family History Family History  Problem Relation Age of Onset  . Alzheimer's disease Other   . Coronary artery disease Other   . Breast cancer Other   . Arthritis Other   . Hypertension Other   . Diabetes Other     Social History Social History   Tobacco Use  . Smoking status: Never Smoker  . Smokeless tobacco: Never Used  Substance Use Topics  . Alcohol use: No  . Drug use: No  Allergies   Patient has no known allergies.   Review of Systems Review of Systems  Constitutional: Positive for chills. Negative for fever.  HENT: Positive for ear pain, rhinorrhea and sore throat.   Respiratory: Positive for cough. Negative for shortness of breath.   All other systems reviewed and are negative.    Physical Exam Updated Vital Signs BP 136/65   Pulse 64   Temp 98.3 F (36.8 C) (Oral)   Resp 18   Ht 1.626 m (5\' 4" )   Wt 81.6 kg   LMP  (LMP Unknown)   SpO2 99%   BMI 30.90 kg/m   Physical Exam CONSTITUTIONAL: Well developed/well nourished HEAD: Normocephalic/atraumatic EYES: EOMI/PERRL ENMT: Mucous membranes moist, nasal congestion, uvula midline without erythema/exudate NECK: supple no  meningeal signs SPINE/BACK:entire spine nontender CV: S1/S2 noted, no murmurs/rubs/gallops noted LUNGS: Decreased breath sounds bilaterally, scattered wheeze, no rales, no acute distress ABDOMEN: soft, nontender, no rebound or guarding, bowel sounds noted throughout abdomen GU:no cva tenderness NEURO: Pt is awake/alert/appropriate, moves all extremitiesx4.  No facial droop.   EXTREMITIES: pulses normal/equal, full ROM, no lower extremity edema SKIN: warm, color normal PSYCH: no abnormalities of mood noted, alert and oriented to situation   ED Treatments / Results  Labs (all labs ordered are listed, but only abnormal results are displayed) Labs Reviewed - No data to display  EKG None  Radiology No results found.   Procedures Procedures    Medications Ordered in ED Medications  albuterol (PROVENTIL HFA;VENTOLIN HFA) 108 (90 Base) MCG/ACT inhaler 2 puff (has no administration in time range)  albuterol (PROVENTIL) (2.5 MG/3ML) 0.083% nebulizer solution 5 mg (5 mg Nebulization Given 10/06/18 0610)  ipratropium (ATROVENT) nebulizer solution 0.5 mg (0.5 mg Nebulization Given 10/06/18 0610)  dexamethasone (DECADRON) tablet 10 mg (10 mg Oral Given 10/06/18 5498)     Initial Impression / Assessment and Plan / ED Course  I have reviewed the triage vital signs and the nursing notes.     6:50 AM PT Presents for recurrent cough.  She had an episode earlier in the month that had some improvement then it returned over the past several days.  This likely represents a new viral infection.  She had no crackles on exam, but did have scattered wheeze.  She was given albuterol and Atrovent with some improvement Suspicion she has a viral induced bronchospasm.  She is in no acute distress, no hypoxia. She is afebrile, does not appear to have influenza clinically I feel she is appropriate for discharge home . She has been given a variety of medications to help with her cough as she reports it is  disrupting her sleep and also when she is at school. She was also given steroids here in the ER. Plan to discharge home, we discussed strict ER return precautions Final Clinical Impressions(s) / ED Diagnoses   Final diagnoses:  Viral URI with cough    ED Discharge Orders         Ordered    benzonatate (TESSALON) 100 MG capsule  Every 8 hours     10/06/18 0644    chlorpheniramine-HYDROcodone (TUSSIONEX PENNKINETIC ER) 10-8 MG/5ML SUER  At bedtime PRN     10/06/18 2641           Zadie Rhine, MD 10/06/18 [phone]

## 2018-10-06 NOTE — ED Triage Notes (Signed)
cough x1 month, body aches, congestion. Reports she cannot sleep. Treating with OTC meds

## 2018-10-06 NOTE — ED Notes (Signed)
ED Provider at bedside. 

## 2018-10-09 ENCOUNTER — Emergency Department (HOSPITAL_BASED_OUTPATIENT_CLINIC_OR_DEPARTMENT_OTHER): Payer: Self-pay

## 2018-10-09 ENCOUNTER — Encounter (HOSPITAL_BASED_OUTPATIENT_CLINIC_OR_DEPARTMENT_OTHER): Payer: Self-pay | Admitting: Emergency Medicine

## 2018-10-09 ENCOUNTER — Emergency Department (HOSPITAL_BASED_OUTPATIENT_CLINIC_OR_DEPARTMENT_OTHER)
Admission: EM | Admit: 2018-10-09 | Discharge: 2018-10-09 | Disposition: A | Discharge status: Home / Self Care | Payer: Self-pay | Attending: Emergency Medicine | Admitting: Emergency Medicine

## 2018-10-09 ENCOUNTER — Other Ambulatory Visit: Payer: Self-pay

## 2018-10-09 DIAGNOSIS — R002 Palpitations: Secondary | ICD-10-CM | POA: Insufficient documentation

## 2018-10-09 DIAGNOSIS — I10 Essential (primary) hypertension: Secondary | ICD-10-CM | POA: Insufficient documentation

## 2018-10-09 DIAGNOSIS — Z7984 Long term (current) use of oral hypoglycemic drugs: Secondary | ICD-10-CM | POA: Insufficient documentation

## 2018-10-09 DIAGNOSIS — E119 Type 2 diabetes mellitus without complications: Secondary | ICD-10-CM | POA: Insufficient documentation

## 2018-10-09 DIAGNOSIS — Z79899 Other long term (current) drug therapy: Secondary | ICD-10-CM | POA: Insufficient documentation

## 2018-10-09 LAB — CBC WITH DIFFERENTIAL/PLATELET
Abs Immature Granulocytes: 0.02 10*3/uL (ref 0.00–0.07)
Basophils Absolute: 0 10*3/uL (ref 0.0–0.1)
Basophils Relative: 0 %
Eosinophils Absolute: 0.2 10*3/uL (ref 0.0–0.5)
Eosinophils Relative: 3 %
HCT: 44 % (ref 36.0–46.0)
Hemoglobin: 14.2 g/dL (ref 12.0–15.0)
Immature Granulocytes: 0 %
Lymphocytes Relative: 52 %
Lymphs Abs: 4.8 10*3/uL — ABNORMAL HIGH (ref 0.7–4.0)
MCH: 30.6 pg (ref 26.0–34.0)
MCHC: 32.3 g/dL (ref 30.0–36.0)
MCV: 94.8 fL (ref 80.0–100.0)
Monocytes Absolute: 0.6 10*3/uL (ref 0.1–1.0)
Monocytes Relative: 7 %
Neutro Abs: 3.4 10*3/uL (ref 1.7–7.7)
Neutrophils Relative %: 38 %
Platelets: 216 10*3/uL (ref 150–400)
RBC: 4.64 MIL/uL (ref 3.87–5.11)
RDW: 13.2 % (ref 11.5–15.5)
WBC: 9.1 10*3/uL (ref 4.0–10.5)
nRBC: 0 % (ref 0.0–0.2)

## 2018-10-09 LAB — BASIC METABOLIC PANEL
Anion gap: 6 (ref 5–15)
BUN: 13 mg/dL (ref 6–20)
CO2: 24 mmol/L (ref 22–32)
Calcium: 8.2 mg/dL — ABNORMAL LOW (ref 8.9–10.3)
Chloride: 107 mmol/L (ref 98–111)
Creatinine, Ser: 0.6 mg/dL (ref 0.44–1.00)
GFR calc Af Amer: >60 mL/min (ref >60)
GFR calc non Af Amer: >60 mL/min (ref >60)
Glucose, Bld: 122 mg/dL — ABNORMAL HIGH (ref 70–99)
Potassium: 3.5 mmol/L (ref 3.5–5.1)
Sodium: 137 mmol/L (ref 135–145)

## 2018-10-09 MED ORDER — ALUM & MAG HYDROXIDE-SIMETH 200-200-20 MG/5ML PO SUSP
30.0000 mL | Freq: Once | ORAL | Status: DC
  Filled 2018-10-09: qty 30

## 2018-10-09 NOTE — ED Notes (Signed)
Labs@0222 , return for imaging

## 2018-10-09 NOTE — ED Provider Notes (Signed)
[facility] EMERGENCY DEPARTMENT Provider Note  CSN: [id number] Arrival date & time: 10/09/18 0151  Chief Complaint(s) Shortness of Breath  HPI Erin Craig is a 58 y.o. female with past medical history listed below recently seen for bronchitis and given albuterol who presents to the emergency department with what she reported as shortness of breath.  However upon further questioning, patient reported that while lying down she felt a sudden onset of rapid heart rate and pounding.  She immediately sat up resulting in resolution of her symptoms.  Prior to this she reports that she had taken few puffs of the albuterol.  Patient also reported that she had drank warm lemon juice.  Patient denied any chest heaviness or pressure.  She is currently asymptomatic.  Her symptoms have not returned since that brief episode.  Other than the bronchitis, the patient denies any other infectious symptoms.  No associated nausea vomiting.  No abdominal pain.  No diarrhea.  HPI  Past Medical History Past Medical History:  Diagnosis Date  . Bradycardia   . Diabetes mellitus without complication (HCC)   . Heart murmur   . Hypercholesterolemia   . Hypertension    There are no active problems to display for this patient.  Home Medication(s) Prior to Admission medications   Medication Sig Start Date End Date Taking? Authorizing Provider  acetaminophen (TYLENOL) 325 MG tablet Take 325 mg by mouth every 6 (six) hours as needed. Headache/pain    [provider]  atorvastatin (LIPITOR) 10 MG tablet Take 10 mg by mouth daily.    [provider]  benzonatate (TESSALON) 100 MG capsule Take 1 capsule (100 mg total) by mouth every 8 (eight) hours. 10/06/18   Zadie Rhine, MD  chlorpheniramine-HYDROcodone [facility] PENNKINETIC ER) 10-8 MG/5ML SUER Take 5 mLs by mouth at bedtime as needed for cough. 10/06/18   Zadie Rhine, MD  diclofenac sodium (VOLTAREN) 1 % GEL Apply 4 g topically 4  (four) times daily. 12/28/17   Joy, Shawn C, PA-C  Diclofenac Sodium CR (VOLTAREN-XR) 100 MG 24 hr tablet Take 1 tablet (100 mg total) by mouth daily. 12/04/17   Palumbo, April, MD  furosemide (LASIX) 20 MG tablet Take 1 tablet (20 mg total) by mouth daily. 01/09/18   Bethel Born, PA-C  glipiZIDE (GLUCOTROL) 5 MG tablet Take 10 mg by mouth daily before breakfast.    [provider]  lisinopril (PRINIVIL,ZESTRIL) 5 MG tablet Take 5 mg by mouth daily.    [provider]  metFORMIN (GLUCOPHAGE) 1000 MG tablet Take 1,000 mg by mouth 1 day or 1 dose.    [provider]                                                                                                                                    Past Surgical History Past Surgical History:  Procedure Laterality Date  . ABDOMINAL HYSTERECTOMY    .  CESAREAN SECTION    . CHOLECYSTECTOMY    . FOOT SURGERY     Family History Family History  Problem Relation Age of Onset  . Alzheimer's disease Other   . Coronary artery disease Other   . Breast cancer Other   . Arthritis Other   . Hypertension Other   . Diabetes Other     Social History Social History   Tobacco Use  . Smoking status: Never Smoker  . Smokeless tobacco: Never Used  Substance Use Topics  . Alcohol use: No  . Drug use: No   Allergies Patient has no known allergies.  Review of Systems Review of Systems All other systems are reviewed and are negative for acute change except as noted in the HPI  Physical Exam Vital Signs  I have reviewed the triage vital signs BP 132/81   Pulse (!) 51   Temp 98.1 F (36.7 C)   Resp 19   Ht  (1.626 m)   Wt 81.6 kg   LMP  (LMP Unknown)   SpO2 100%   BMI 30.90 kg/m   Physical Exam Vitals signs reviewed.  Constitutional:      General: She is not in acute distress.    Appearance: She is well-developed. She is not diaphoretic.  HENT:     Head: Normocephalic and atraumatic.     Nose: Nose  normal.  Eyes:     General: No scleral icterus.       Right eye: No discharge.        Left eye: No discharge.     Conjunctiva/sclera: Conjunctivae normal.     Pupils: Pupils are equal, round, and reactive to light.  Neck:     Musculoskeletal: Normal range of motion and neck supple.  Cardiovascular:     Rate and Rhythm: Normal rate and regular rhythm.     Heart sounds: No murmur. No friction rub. No gallop.   Pulmonary:     Effort: Pulmonary effort is normal. No respiratory distress.     Breath sounds: Normal breath sounds. No stridor. No rales.  Abdominal:     General: There is no distension.     Palpations: Abdomen is soft.     Tenderness: There is no abdominal tenderness.  Musculoskeletal:        General: No tenderness.  Skin:    General: Skin is warm and dry.     Findings: No erythema or rash.  Neurological:     Mental Status: She is alert and oriented to person, place, and time.     ED Results and Treatments Labs (all labs ordered are listed, but only abnormal results are displayed) Labs Reviewed  CBC WITH DIFFERENTIAL/PLATELET - Abnormal; Notable for the following components:      Result Value   Lymphs Abs 4.8 (*)    All other components within normal limits  BASIC METABOLIC PANEL - Abnormal; Notable for the following components:   Glucose, Bld 122 (*)    Calcium 8.2 (*)    All other components within normal limits  EKG  EKG Interpretation  Date/Time:  Sunday October 09 2018 02:13:57 EST Ventricular Rate:  75 PR Interval:    QRS Duration: 91 QT Interval:  418 QTC Calculation: 467 R Axis:   61 Text Interpretation:  Sinus rhythm Borderline T wave abnormalities Artifact Otherwise no significant change Confirmed by Drema Pry [phone]) on 10/09/2018 4:13:49 AM      Radiology Dg Chest 2 View  Result Date: 10/09/2018 CLINICAL DATA:  58 year old  female with cough and wheezing. EXAM: CHEST - 2 VIEW COMPARISON:  Chest radiograph dated 09/10/2018 FINDINGS: No focal consolidation, pleural effusion, or pneumothorax. Top-normal cardiac size. No acute osseous pathology. IMPRESSION: No active cardiopulmonary disease. Electronically Signed   By: Elgie Collard M.D.   On: 10/09/2018 02:56   Pertinent labs & imaging results that were available during my care of the patient were reviewed by me and considered in my medical decision making (see chart for details).  Medications Ordered in ED Medications  alum & mag hydroxide-simeth (MAALOX/MYLANTA) 200-200-20 MG/5ML suspension 30 mL (30 mLs Oral Not Given 10/09/18 0330)                                                                                                                                    Procedures Procedures  (including critical care time)  Medical Decision Making / ED Course I have reviewed the nursing notes for this encounter and the patient's prior records (if available in EHR or on provided paperwork).    Patient presents with brief episode of what appears to be rapid palpitations that were short-lived.  Currently asymptomatic.  Denies any chest pain or current shortness of breath.  EKG without acute ischemic changes or evidence of pericarditis.  Symptoms are highly atypical and inconsistent with ACS.  Low suspicion for pulmonary embolism.  I believe that this was either related to recent albuterol use or acid reflux.  Screening labs were obtained and did not reveal any significant electrolyte derangements.  No leukocytosis or anemia.  Do not feel that additional cardiac work-up is necessary at this time. Chest x-ray without evidence suggestive of pneumonia, pneumothorax, pneumomediastinum.  No abnormal contour of the mediastinum to suggest dissection. No evidence of acute injuries.  Patient was monitored on telemetry few hours without any tachycardia dysrhythmias noted.  She remained  asymptomatic throughout her stay.  The patient appears reasonably screened and/or stabilized for discharge and I doubt any other medical condition or other [facility] requiring further screening, evaluation, or treatment in the ED at this time prior to discharge.  The patient is safe for discharge with strict return precautions.  Final Clinical Impression(s) / ED Diagnoses Final diagnoses:  Rapid palpitations   Disposition: Discharge  Condition: Good  I have discussed the results, Dx and Tx plan with the patient who expressed understanding and agree(s) with the plan. Discharge instructions discussed at great length. The patient was given strict return precautions who verbalized understanding  of the instructions. No further questions at time of discharge.    ED Discharge Orders    None       Follow Up: Tracey Harries, MD [address] [place] [address] [phone]  Schedule an appointment as soon as possible for a visit  As needed      This chart was dictated using voice recognition software.  Despite best efforts to proofread,  errors can occur which can change the documentation meaning.   Nira Conn, MD 10/09/18 [phone]

## 2018-10-09 NOTE — ED Triage Notes (Signed)
C/o SHOB x 1 month while laying down. States inhaler has not helped. Denies other sx.

## 2018-12-27 ENCOUNTER — Other Ambulatory Visit: Payer: Self-pay

## 2018-12-27 ENCOUNTER — Emergency Department (HOSPITAL_BASED_OUTPATIENT_CLINIC_OR_DEPARTMENT_OTHER): Payer: Self-pay

## 2018-12-27 ENCOUNTER — Emergency Department (HOSPITAL_BASED_OUTPATIENT_CLINIC_OR_DEPARTMENT_OTHER)
Admission: EM | Admit: 2018-12-27 | Discharge: 2018-12-27 | Disposition: A | Discharge status: Home / Self Care | Payer: Self-pay | Attending: Emergency Medicine | Admitting: Emergency Medicine

## 2018-12-27 ENCOUNTER — Encounter (HOSPITAL_BASED_OUTPATIENT_CLINIC_OR_DEPARTMENT_OTHER): Payer: Self-pay | Admitting: *Deleted

## 2018-12-27 DIAGNOSIS — R51 Headache: Secondary | ICD-10-CM | POA: Insufficient documentation

## 2018-12-27 DIAGNOSIS — Z7984 Long term (current) use of oral hypoglycemic drugs: Secondary | ICD-10-CM | POA: Insufficient documentation

## 2018-12-27 DIAGNOSIS — I10 Essential (primary) hypertension: Secondary | ICD-10-CM | POA: Insufficient documentation

## 2018-12-27 DIAGNOSIS — R040 Epistaxis: Secondary | ICD-10-CM | POA: Insufficient documentation

## 2018-12-27 DIAGNOSIS — E119 Type 2 diabetes mellitus without complications: Secondary | ICD-10-CM | POA: Insufficient documentation

## 2018-12-27 DIAGNOSIS — R519 Headache, unspecified: Secondary | ICD-10-CM

## 2018-12-27 MED ORDER — SILVER NITRATE-POT NITRATE 75-25 % EX MISC
1.0000 "application " | Freq: Once | CUTANEOUS | Status: AC
  Administered 2018-12-27: 1 via TOPICAL
  Filled 2018-12-27: qty 1

## 2018-12-27 MED ORDER — SALINE SPRAY 0.65 % NA SOLN
1.0000 | Freq: Once | NASAL | Status: AC
  Administered 2018-12-27: 1 via NASAL
  Filled 2018-12-27: qty 44

## 2018-12-27 MED ORDER — KETOROLAC TROMETHAMINE 30 MG/ML IJ SOLN
30.0000 mg | Freq: Once | INTRAMUSCULAR | Status: AC
  Administered 2018-12-27: 30 mg via INTRAMUSCULAR
  Filled 2018-12-27: qty 1

## 2018-12-27 MED ORDER — IBUPROFEN 600 MG PO TABS: 600.0000 mg | ORAL_TABLET | Freq: Four times a day (QID) | ORAL | 0 refills | Status: DC | PRN

## 2018-12-27 NOTE — ED Triage Notes (Signed)
Nosebleed on and off x 3 days. No active bleeding on arrival to triage. Headache has been constant.

## 2018-12-27 NOTE — ED Provider Notes (Signed)
[facility] EMERGENCY DEPARTMENT Provider Note   CSN: [id number] Arrival date & time: 12/27/18  1522    History   Chief Complaint Chief Complaint  Patient presents with  . Headache  . Epistaxis    HPI Erin Craig is a 58 y.o. female.     Pt presents to the ED today with headaches and nose bleeds.  Pt said she's had a nose bleed on the left on and off for the past 3 days.  She denies any active bleeding now.  The headache has been constant.  She has taken several otc meds without improvement.  No f/c.  No cough.  No sob.     Past Medical History:  Diagnosis Date  . Bradycardia   . Diabetes mellitus without complication (HCC)   . Heart murmur   . Hypercholesterolemia   . Hypertension     There are no active problems to display for this patient.   Past Surgical History:  Procedure Laterality Date  . ABDOMINAL HYSTERECTOMY    . CESAREAN SECTION    . CHOLECYSTECTOMY    . FOOT SURGERY       OB History   No obstetric history on file.      Home Medications    Prior to Admission medications   Medication Sig Start Date End Date Taking? Authorizing Provider  acetaminophen (TYLENOL) 325 MG tablet Take 325 mg by mouth every 6 (six) hours as needed. Headache/pain   Yes [provider]  atorvastatin (LIPITOR) 10 MG tablet Take 10 mg by mouth daily.    [provider]  benzonatate (TESSALON) 100 MG capsule Take 1 capsule (100 mg total) by mouth every 8 (eight) hours. 10/06/18   Zadie Rhine, MD  chlorpheniramine-HYDROcodone [facility] PENNKINETIC ER) 10-8 MG/5ML SUER Take 5 mLs by mouth at bedtime as needed for cough. 10/06/18   Zadie Rhine, MD  diclofenac sodium (VOLTAREN) 1 % GEL Apply 4 g topically 4 (four) times daily. 12/28/17   Joy, Shawn C, PA-C  Diclofenac Sodium CR (VOLTAREN-XR) 100 MG 24 hr tablet Take 1 tablet (100 mg total) by mouth daily. 12/04/17   Palumbo, April, MD  furosemide (LASIX) 20 MG tablet Take 1 tablet (20 mg  total) by mouth daily. 01/09/18   Bethel Born, PA-C  glipiZIDE (GLUCOTROL) 5 MG tablet Take 10 mg by mouth daily before breakfast.    [provider]  ibuprofen (ADVIL) 600 MG tablet Take 1 tablet (600 mg total) by mouth every 6 (six) hours as needed. 12/27/18   Jacalyn Lefevre, MD  lisinopril (PRINIVIL,ZESTRIL) 5 MG tablet Take 5 mg by mouth daily.    [provider]  metFORMIN (GLUCOPHAGE) 1000 MG tablet Take 1,000 mg by mouth 1 day or 1 dose.    [provider]    Family History Family History  Problem Relation Age of Onset  . Alzheimer's disease Other   . Coronary artery disease Other   . Breast cancer Other   . Arthritis Other   . Hypertension Other   . Diabetes Other     Social History Social History   Tobacco Use  . Smoking status: Never Smoker  . Smokeless tobacco: Never Used  Substance Use Topics  . Alcohol use: No  . Drug use: No     Allergies   Patient has no known allergies.   Review of Systems Review of Systems  HENT: Positive for nosebleeds.   Neurological: Positive for headaches.  All other systems reviewed and  are negative.    Physical Exam Updated Vital Signs BP 130/66   Pulse (!) 55   Temp 97.8 F (36.6 C) (Oral)   Resp 18   Ht 5\' 4"  (1.626 m)   Wt 79.4 kg   LMP  (LMP Unknown)   SpO2 100%   BMI 30.04 kg/m   Physical Exam Vitals signs and nursing note reviewed.  Constitutional:      Appearance: She is well-developed.  HENT:     Head: Normocephalic and atraumatic.     Nose: Nasal tenderness and mucosal edema present.     Mouth/Throat:     Mouth: Mucous membranes are moist.     Pharynx: Oropharynx is clear.  Eyes:     Extraocular Movements: Extraocular movements intact.     Pupils: Pupils are equal, round, and reactive to light.  Neck:     Musculoskeletal: Normal range of motion and neck supple.  Cardiovascular:     Rate and Rhythm: Normal rate and regular rhythm.  Pulmonary:     Effort: Pulmonary  effort is normal.     Breath sounds: Normal breath sounds.  Abdominal:     General: Bowel sounds are normal.     Palpations: Abdomen is soft.  Musculoskeletal: Normal range of motion.  Skin:    General: Skin is warm and dry.     Capillary Refill: Capillary refill takes less than 2 seconds.  Neurological:     Mental Status: She is alert and oriented to person, place, and time.  Psychiatric:        Mood and Affect: Mood normal.        Speech: Speech normal.        Behavior: Behavior normal.      ED Treatments / Results  Labs (all labs ordered are listed, but only abnormal results are displayed) Labs Reviewed - No data to display  EKG None  Radiology Ct Head Wo Contrast  Result Date: 12/27/2018 CLINICAL DATA:  Headache EXAM: CT HEAD WITHOUT CONTRAST TECHNIQUE: Contiguous axial images were obtained from the base of the skull through the vertex without intravenous contrast. COMPARISON:  August 17, 2010 FINDINGS: Brain: The ventricles are normal in size and configuration. There is no intracranial mass, hemorrhage, extra-axial fluid collection, or midline shift. Brain parenchyma appears unremarkable. No evident acute infarct. Vascular: There is no appreciable hyperdense vessel. There are foci of calcification in the left carotid siphon. Skull: Bony calvarium appears intact. Sinuses/Orbits: Visualized paranasal sinuses are clear. Visualized orbits appear symmetric bilaterally. Other: Mastoid air cells are clear. IMPRESSION: Mild arterial vascular calcification. Brain parenchyma appears unremarkable. No mass or hemorrhage. Electronically Signed   By: Bretta BangWilliam  Woodruff III M.D.   On: 12/27/2018 18:17    Procedures .Epistaxis Management Date/Time: 12/27/2018 6:19 PM Performed by: Jacalyn LefevreHaviland, Genevieve Arbaugh, MD Authorized by: Jacalyn LefevreHaviland, Ladislao Cohenour, MD   Consent:    Consent obtained:  Verbal   Consent given by:  Patient   Risks discussed:  Pain   Alternatives discussed:  No treatment Anesthesia (see MAR  for exact dosages):    Anesthesia method:  None Procedure details:    Treatment site:  L anterior   Treatment method:  Silver nitrate   Treatment complexity:  Limited   Treatment episode: initial   Post-procedure details:    Patient tolerance of procedure:  Tolerated well, no immediate complications   (including critical care time)  Medications Ordered in ED Medications  silver nitrate applicators applicator 1 application (has no administration in time range)  sodium chloride (OCEAN) 0.65 % nasal spray 1 spray (has no administration in time range)  ketorolac (TORADOL) 30 MG/ML injection 30 mg (30 mg Intramuscular Given 12/27/18 1712)     Initial Impression / Assessment and Plan / ED Course  I have reviewed the triage vital signs and the nursing notes.  Pertinent labs & imaging results that were available during my care of the patient were reviewed by me and considered in my medical decision making (see chart for details).    CT shows nothing acute.    Pt is feeling better after toradol.  She is given saline nasal spray and will be d/c home with instructions to return if worse.  Final Clinical Impressions(s) / ED Diagnoses   Final diagnoses:  Anterior epistaxis  Acute nonintractable headache, unspecified headache type    ED Discharge Orders         Ordered    ibuprofen (ADVIL) 600 MG tablet  Every 6 hours PRN     12/27/18 1821           Jacalyn Lefevre, MD 12/27/18 Rickey Primus

## 2019-04-22 ENCOUNTER — Other Ambulatory Visit: Payer: Self-pay

## 2019-04-22 ENCOUNTER — Encounter (HOSPITAL_BASED_OUTPATIENT_CLINIC_OR_DEPARTMENT_OTHER): Payer: Self-pay

## 2019-04-22 ENCOUNTER — Emergency Department (HOSPITAL_BASED_OUTPATIENT_CLINIC_OR_DEPARTMENT_OTHER)
Admission: EM | Admit: 2019-04-22 | Discharge: 2019-04-23 | Disposition: A | Discharge status: Home / Self Care | Payer: Self-pay | Attending: Emergency Medicine | Admitting: Emergency Medicine

## 2019-04-22 DIAGNOSIS — R040 Epistaxis: Secondary | ICD-10-CM | POA: Insufficient documentation

## 2019-04-22 DIAGNOSIS — M545 Low back pain, unspecified: Secondary | ICD-10-CM

## 2019-04-22 MED ORDER — OXYMETAZOLINE HCL 0.05 % NA SOLN
NASAL | Status: AC
  Filled 2019-04-22: qty 30

## 2019-04-22 NOTE — ED Provider Notes (Signed)
[facility] EMERGENCY DEPARTMENT Provider Note   CSN: [id number] Arrival date & time: 04/22/19  2307     History   Chief Complaint Chief Complaint  Patient presents with  . Back Pain  . Epistaxis    HPI Berdell Nevitt is a 58 y.o. female.     Patient is a 58 year old female with past medical history of prior C-section, hypertension, hypercholesterolemia.  She presents today for evaluation of nosebleed.  This began approximately 2 hours prior to presentation.  This began in the absence of any injury or trauma.  She denies any fevers or chills, or headache.  She denies having blown her nose.    Patient also complains of pain to her left back.  This also began in the absence of any injury or trauma.  She denies discomfort that is worse when she leans, turns, and twists.  She denies any radiation into her legs.  She denies any bowel or bladder complaints.  The history is provided by the patient.    Past Medical History:  Diagnosis Date  . Bradycardia   . Diabetes mellitus without complication ([place])   . Heart murmur   . Hypercholesterolemia   . Hypertension     There are no active problems to display for this patient.   Past Surgical History:  Procedure Laterality Date  . ABDOMINAL HYSTERECTOMY    . CESAREAN SECTION    . CHOLECYSTECTOMY    . FOOT SURGERY       OB History   No obstetric history on file.      Home Medications    Prior to Admission medications   Medication Sig Start Date End Date Taking? Authorizing Provider  acetaminophen (TYLENOL) 325 MG tablet Take 325 mg by mouth every 6 (six) hours as needed. Headache/pain    [provider]  atorvastatin (LIPITOR) 10 MG tablet Take 10 mg by mouth daily.    [provider]  benzonatate (TESSALON) 100 MG capsule Take 1 capsule (100 mg total) by mouth every 8 (eight) hours. 10/06/18   Ripley Fraise, MD  chlorpheniramine-HYDROcodone [facility] PENNKINETIC ER) 10-8 MG/5ML SUER Take 5  mLs by mouth at bedtime as needed for cough. 10/06/18   Ripley Fraise, MD  diclofenac sodium (VOLTAREN) 1 % GEL Apply 4 g topically 4 (four) times daily. 12/28/17   Joy, Shawn C, PA-C  Diclofenac Sodium CR (VOLTAREN-XR) 100 MG 24 hr tablet Take 1 tablet (100 mg total) by mouth daily. 12/04/17   Palumbo, April, MD  furosemide (LASIX) 20 MG tablet Take 1 tablet (20 mg total) by mouth daily. 01/09/18   Recardo Evangelist, PA-C  glipiZIDE (GLUCOTROL) 5 MG tablet Take 10 mg by mouth daily before breakfast.    [provider]  ibuprofen (ADVIL) 600 MG tablet Take 1 tablet (600 mg total) by mouth every 6 (six) hours as needed. 12/27/18   Isla Pence, MD  lisinopril (PRINIVIL,ZESTRIL) 5 MG tablet Take 5 mg by mouth daily.    [provider]  metFORMIN (GLUCOPHAGE) 1000 MG tablet Take 1,000 mg by mouth 1 day or 1 dose.    [provider]    Family History Family History  Problem Relation Age of Onset  . Alzheimer's disease Other   . Coronary artery disease Other   . Breast cancer Other   . Arthritis Other   . Hypertension Other   . Diabetes Other     Social History Social History   Tobacco Use  . Smoking status:  Never Smoker  . Smokeless tobacco: Never Used  Substance Use Topics  . Alcohol use: No  . Drug use: No     Allergies   Patient has no known allergies.   Review of Systems Review of Systems  All other systems reviewed and are negative.    Physical Exam Updated Vital Signs BP 131/75 (BP Location: Right Arm)   Pulse (!) 57   Temp 98.5 F (36.9 C) (Oral)   Resp 16   Ht 5\' 4"  (1.626 m)   Wt 79.4 kg   LMP  (LMP Unknown)   SpO2 99%   BMI 30.04 kg/m   Physical Exam Vitals signs and nursing note reviewed.  Constitutional:      General: She is not in acute distress.    Appearance: Normal appearance. She is not ill-appearing, toxic-appearing or diaphoretic.  HENT:     Head: Normocephalic and atraumatic.  Pulmonary:     Effort: Pulmonary  effort is normal.  Musculoskeletal:     Comments: There is tenderness to palpation in the soft tissues of the left lower lumbar region.  There is no bony tenderness or step-off.  Strength is 5 out of 5 in both lower extremities and patient is ambulatory without difficulty.  Skin:    General: Skin is warm and dry.  Neurological:     Mental Status: She is alert and oriented to person, place, and time.      ED Treatments / Results  Labs (all labs ordered are listed, but only abnormal results are displayed) Labs Reviewed - No data to display  EKG None  Radiology No results found.  Procedures Procedures (including critical care time)  Medications Ordered in ED Medications  oxymetazoline (AFRIN) 0.05 % nasal spray (has no administration in time range)     Initial Impression / Assessment and Plan / ED Course  I have reviewed the triage vital signs and the nursing notes.  Pertinent labs & imaging results that were available during my care of the patient were reviewed by me and considered in my medical decision making (see chart for details).  Patient presenting with complaints of nosebleed and pain to her low back, both of which started approximately 2 hours ago and seem unrelated.  Patient's nosebleed controlled with Afrin and direct pressure and I do not feel as though any packing is indicated at this time.  She will be discharged with continued use of the Afrin, and direct pressure if bleeding resumes.  Patient's back pain appears musculoskeletal in nature.  She will be given Flexeril and advised to take ibuprofen.  To return as needed if symptoms worsen or change.  Final Clinical Impressions(s) / ED Diagnoses   Final diagnoses:  None    ED Discharge Orders    None       Geoffery Lyonselo, Inigo Lantigua, MD 04/23/19 0000

## 2019-04-22 NOTE — ED Triage Notes (Signed)
Pt presents today with two complaints.  1) Pt started experiencing L low back pain after getting home from work. No radiation.   2) Pt has had an active nose bleed for 2.5 hours PTA. Pt states she is spitting up clots. Pt denies taking blood thinners.

## 2019-04-23 MED ORDER — CYCLOBENZAPRINE HCL 10 MG PO TABS: 10.0000 mg | ORAL_TABLET | Freq: Three times a day (TID) | ORAL | 0 refills | Status: DC | PRN

## 2019-04-23 NOTE — Discharge Instructions (Addendum)
Use Afrin nasal spray: 2 sprays every 6 hours for the next 2 days.  If bleeding resumes, apply direct pressure and hold for 15 minutes.  If this does not seem to be improving the bleeding, return to the emergency department for reevaluation.  Take ibuprofen 600 mg every 6 hours as needed for pain.  Flexeril as prescribed as needed for pain not relieved with ibuprofen.  Follow-up with your primary doctor in the next week if not improving, and return to the ER if symptoms significantly worsen or change.

## 2019-04-29 ENCOUNTER — Encounter (HOSPITAL_BASED_OUTPATIENT_CLINIC_OR_DEPARTMENT_OTHER): Payer: Self-pay | Admitting: Emergency Medicine

## 2019-04-29 ENCOUNTER — Emergency Department (HOSPITAL_BASED_OUTPATIENT_CLINIC_OR_DEPARTMENT_OTHER)
Admission: EM | Admit: 2019-04-29 | Discharge: 2019-04-29 | Disposition: A | Discharge status: Home / Self Care | Payer: Self-pay | Attending: Emergency Medicine | Admitting: Emergency Medicine

## 2019-04-29 ENCOUNTER — Other Ambulatory Visit: Payer: Self-pay

## 2019-04-29 DIAGNOSIS — E119 Type 2 diabetes mellitus without complications: Secondary | ICD-10-CM | POA: Insufficient documentation

## 2019-04-29 DIAGNOSIS — I10 Essential (primary) hypertension: Secondary | ICD-10-CM | POA: Insufficient documentation

## 2019-04-29 DIAGNOSIS — E782 Mixed hyperlipidemia: Secondary | ICD-10-CM | POA: Insufficient documentation

## 2019-04-29 DIAGNOSIS — J029 Acute pharyngitis, unspecified: Secondary | ICD-10-CM | POA: Insufficient documentation

## 2019-04-29 DIAGNOSIS — R51 Headache: Secondary | ICD-10-CM | POA: Insufficient documentation

## 2019-04-29 DIAGNOSIS — Z20828 Contact with and (suspected) exposure to other viral communicable diseases: Secondary | ICD-10-CM | POA: Insufficient documentation

## 2019-04-29 DIAGNOSIS — Z79899 Other long term (current) drug therapy: Secondary | ICD-10-CM | POA: Insufficient documentation

## 2019-04-29 DIAGNOSIS — Z7984 Long term (current) use of oral hypoglycemic drugs: Secondary | ICD-10-CM | POA: Insufficient documentation

## 2019-04-29 LAB — GROUP A STREP BY PCR: Group A Strep by PCR: NOT DETECTED

## 2019-04-29 MED ORDER — DEXAMETHASONE SODIUM PHOSPHATE 4 MG/ML IJ SOLN
4.0000 mg | Freq: Once | INTRAMUSCULAR | Status: AC
  Administered 2019-04-29: 4 mg via INTRAMUSCULAR
  Filled 2019-04-29: qty 1

## 2019-04-29 NOTE — ED Provider Notes (Signed)
[facility] EMERGENCY DEPARTMENT Provider Note   CSN: [id number] Arrival date & time: 04/29/19  1937     History   Chief Complaint Chief Complaint  Patient presents with  . Sore Throat    HPI Erin Craig is a 58 y.o. female.  Presents emergency department with chief complaint of sore throat.  Patient reports that she has been having symptoms for the last couple days.  Has been slowly worsening, primary complaint is throat pain, but also has been having some sinus pressure.  Denies any significant nasal congestion.  Has been able to eat and drink water without difficulty.  Has tried gargling salt water with no improvement.  Has not taken any medication for this yet.  Denies neck stiffness neck swelling, fevers.  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  She has not had any associated cough or difficulty breathing or loss of taste/smell.     HPI  Past Medical History:  Diagnosis Date  . Bradycardia   . Diabetes mellitus without complication ([facility])   . Heart murmur   . Hypercholesterolemia   . Hypertension     There are no active problems to display for this patient.   Past Surgical History:  Procedure Laterality Date  . ABDOMINAL HYSTERECTOMY    . CESAREAN SECTION    . CHOLECYSTECTOMY    . FOOT SURGERY       OB History   No obstetric history on file.      Home Medications    Prior to Admission medications   Medication Sig Start Date End Date Taking? Authorizing Provider  acetaminophen (TYLENOL) 325 MG tablet Take 325 mg by mouth every 6 (six) hours as needed. Headache/pain    [provider]  atorvastatin (LIPITOR) 10 MG tablet Take 10 mg by mouth daily.    [provider]  benzonatate (TESSALON) 100 MG capsule Take 1 capsule (100 mg total) by mouth every 8 (eight) hours. 10/06/18   Zadie Rhine, MD  chlorpheniramine-HYDROcodone [facility] PENNKINETIC ER) 10-8 MG/5ML SUER Take 5 mLs by mouth at  bedtime as needed for cough. 10/06/18   Zadie Rhine, MD  cyclobenzaprine (FLEXERIL) 10 MG tablet Take 1 tablet (10 mg total) by mouth 3 (three) times daily as needed for muscle spasms. 04/23/19   Geoffery Lyons, MD  diclofenac sodium (VOLTAREN) 1 % GEL Apply 4 g topically 4 (four) times daily. 12/28/17   Joy, Shawn C, PA-C  Diclofenac Sodium CR (VOLTAREN-XR) 100 MG 24 hr tablet Take 1 tablet (100 mg total) by mouth daily. 12/04/17   Palumbo, April, MD  furosemide (LASIX) 20 MG tablet Take 1 tablet (20 mg total) by mouth daily. 01/09/18   Bethel Born, PA-C  glipiZIDE (GLUCOTROL) 5 MG tablet Take 10 mg by mouth daily before breakfast.    [provider]  ibuprofen (ADVIL) 600 MG tablet Take 1 tablet (600 mg total) by mouth every 6 (six) hours as needed. 12/27/18   Jacalyn Lefevre, MD  lisinopril (PRINIVIL,ZESTRIL) 5 MG tablet Take 5 mg by mouth daily.    [provider]  metFORMIN (GLUCOPHAGE) 1000 MG tablet Take 1,000 mg by mouth 1 day or 1 dose.    [provider]    Family History Family History  Problem Relation Age of Onset  . Alzheimer's disease Other   . Coronary artery disease Other   . Breast cancer Other   . Arthritis Other   . Hypertension Other   .  Diabetes Other     Social History Social History   Tobacco Use  . Smoking status: Never Smoker  . Smokeless tobacco: Never Used  Substance Use Topics  . Alcohol use: No  . Drug use: No     Allergies   Patient has no known allergies.   Review of Systems Review of Systems  Constitutional: Negative for chills and fever.  HENT: Positive for sore throat. Negative for ear pain.   Eyes: Negative for pain and visual disturbance.  Respiratory: Negative for cough and shortness of breath.   Cardiovascular: Negative for chest pain and palpitations.  Gastrointestinal: Negative for abdominal pain and vomiting.  Genitourinary: Negative for dysuria and hematuria.  Musculoskeletal: Negative for  arthralgias and back pain.  Skin: Negative for color change and rash.  Neurological: Negative for seizures and syncope.  All other systems reviewed and are negative.    Physical Exam Updated Vital Signs BP 132/78 (BP Location: Right Arm)   Pulse (!) 51   Temp 98.1 F (36.7 C) (Oral)   Resp 20   Ht 5\' 4"  (1.626 m)   Wt 83.9 kg   LMP  (LMP Unknown)   SpO2 100%   BMI 31.76 kg/m   Physical Exam Vitals signs and nursing note reviewed.  Constitutional:      General: She is not in acute distress.    Appearance: She is well-developed.  HENT:     Head: Normocephalic and atraumatic.     Comments: Posterior oropharynx is somewhat erythematous, no tonsillar exudates, patent airway Eyes:     Conjunctiva/sclera: Conjunctivae normal.  Neck:     Musculoskeletal: Neck supple.  Cardiovascular:     Rate and Rhythm: Normal rate and regular rhythm.     Heart sounds: No murmur.  Pulmonary:     Effort: Pulmonary effort is normal. No respiratory distress.     Breath sounds: Normal breath sounds.  Abdominal:     Palpations: Abdomen is soft.     Tenderness: There is no abdominal tenderness.  Skin:    General: Skin is warm and dry.  Neurological:     Mental Status: She is alert.      ED Treatments / Results  Labs (all labs ordered are listed, but only abnormal results are displayed) Labs Reviewed  GROUP A STREP BY PCR  SARS CORONAVIRUS 2 (TAT 6-24 HRS)    EKG None  Radiology No results found.  Procedures Procedures (including critical care time)  Medications Ordered in ED Medications  dexamethasone (DECADRON) injection 4 mg (4 mg Intramuscular Given 04/29/19 0739)     Initial Impression / Assessment and Plan / ED Course  I have reviewed the triage vital signs and the nursing notes.  Pertinent labs & imaging results that were available during my care of the patient were reviewed by me and considered in my medical decision making (see chart for details).         58 year old lady presents with sore throat, erythematous posterior oropharynx but otherwise well-appearing.  No neck stiffness, neck swelling, normal neck range of motion, no difficulty swallowing, no muffled voice.  Suspect viral pharyngitis.  Rapid strep was negative.  Gave dose of Decadron for symptom control.  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  Reviewed return precautions as well as isolation precautions until she has results of her COVID-19 test.    After the discussed management above, the patient was determined to be safe for discharge.  The patient was in agreement  with this plan and all questions regarding their care were answered.  ED return precautions were discussed and the patient will return to the ED with any significant worsening of condition.    Final Clinical Impressions(s) / ED Diagnoses   Final diagnoses:  Pharyngitis, unspecified etiology    ED Discharge Orders    None       Milagros Lollykstra, Geddy Boydstun S, MD 04/29/19 [phone]

## 2019-04-29 NOTE — Discharge Instructions (Signed)
Recommend following your employers policy regarding return to work for employees who are experiencing sore throat.  Likely, you should not return to work until you have a negative result from your COVID test.  If you develop difficulty swallowing, fever, neck swelling or other new concerning symptom please return to ER for reassessment.  Otherwise recommend taking Tylenol, Motrin as needed for pain control.

## 2019-04-29 NOTE — ED Triage Notes (Signed)
Patient arrived via POV c/o sore throat and sinus pressure since previous evening starting approximately 2100. Patient states she tried salt water gargle and afrin for pain/pressure with no relief. Patient is AO x 4, VS stable, normal gait.

## 2019-04-30 LAB — SARS CORONAVIRUS 2 (TAT 6-24 HRS): SARS Coronavirus 2: NEGATIVE

## 2019-05-29 IMAGING — US US EXTREM LOW VENOUS*L*
1 series · 13 of 24 positions shown · non-contrast
Comparison: None.

CLINICAL DATA: Acute left distal calf and ankle pain with swelling



[Series 1: us extrem low venous*left* · 0.08mm/px · 13 of 26 slices shown]
[im 1/26]
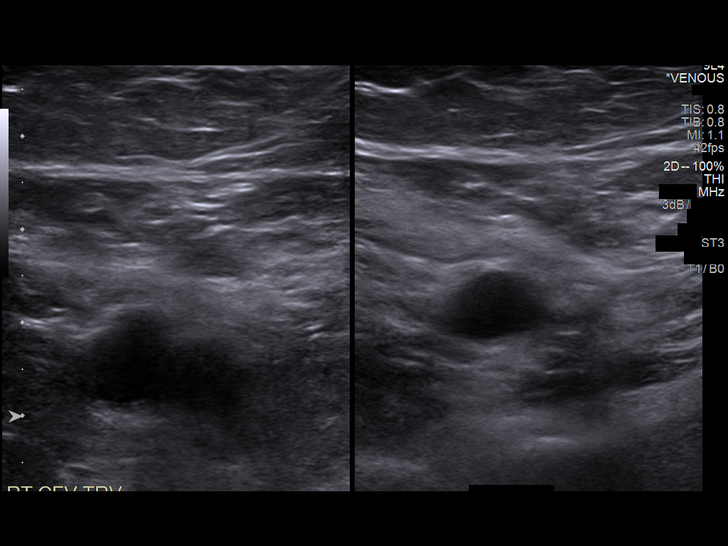
[im 3/26]
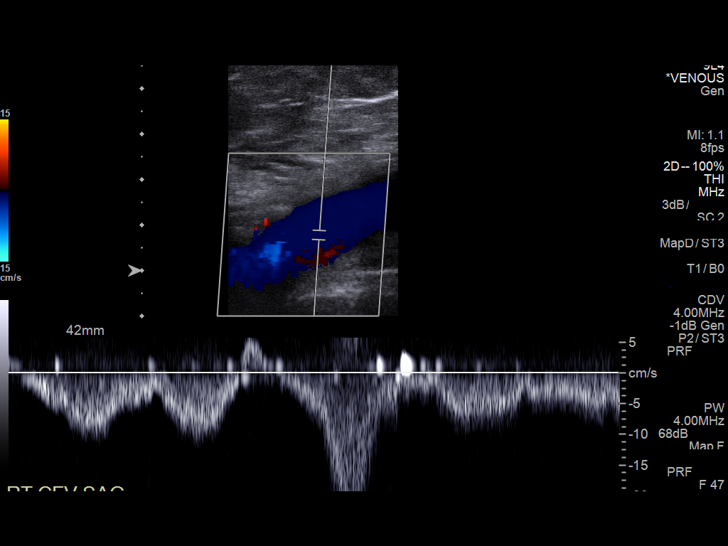
[im 5/26]
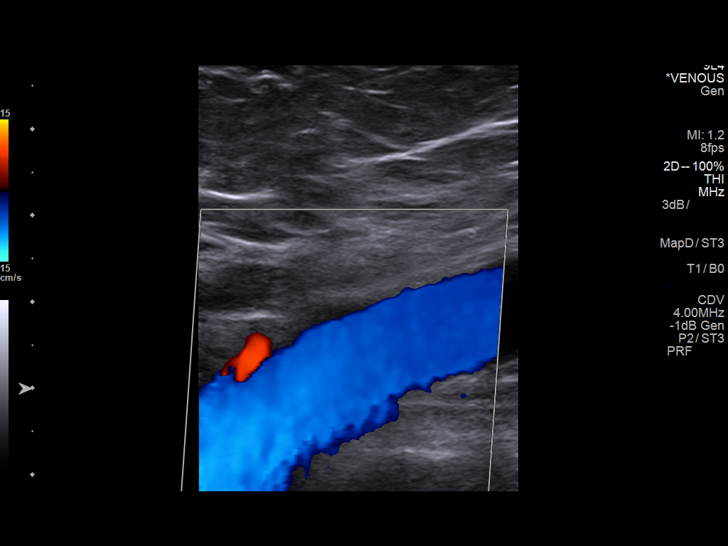
[im 7/26]
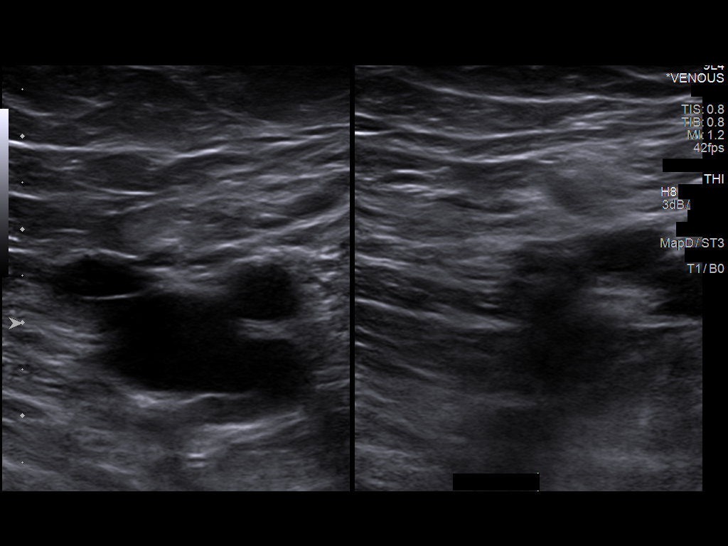
[im 9/26]
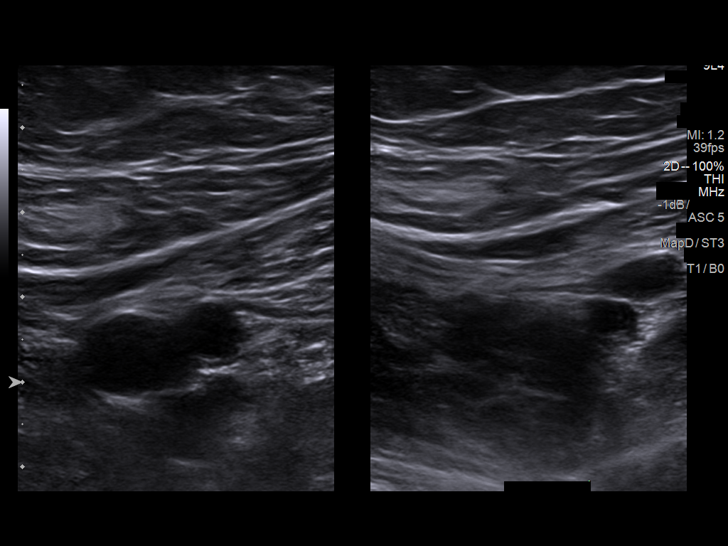
[im 11/26]
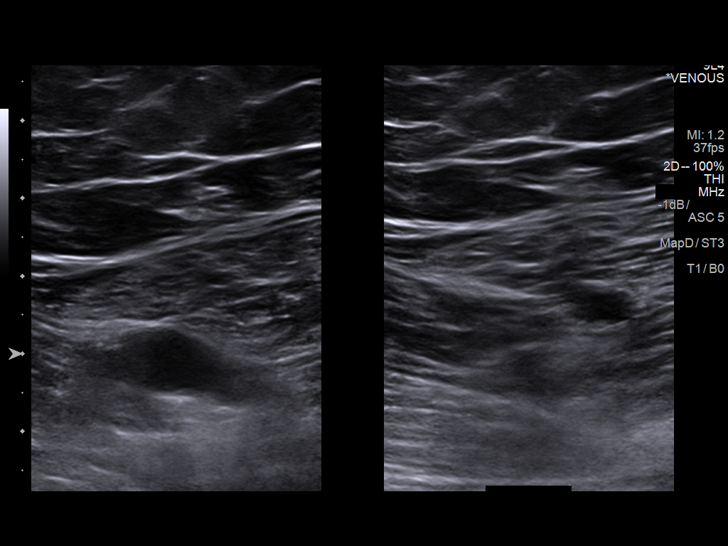
[im 14/26]
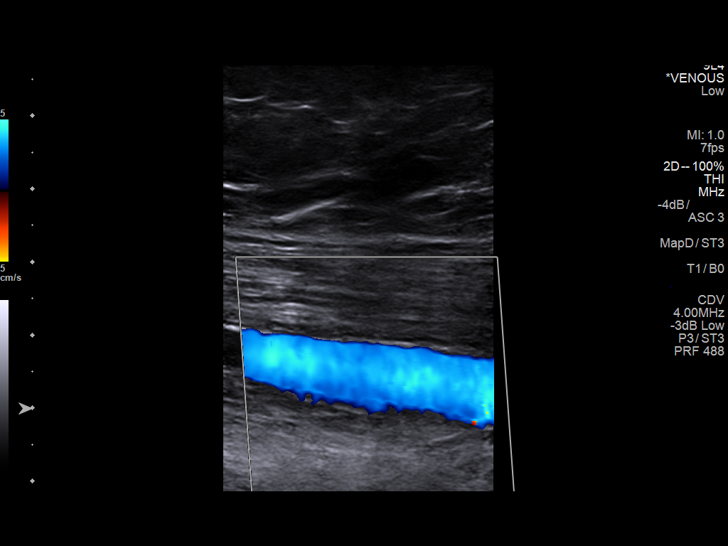
[im 15/26]
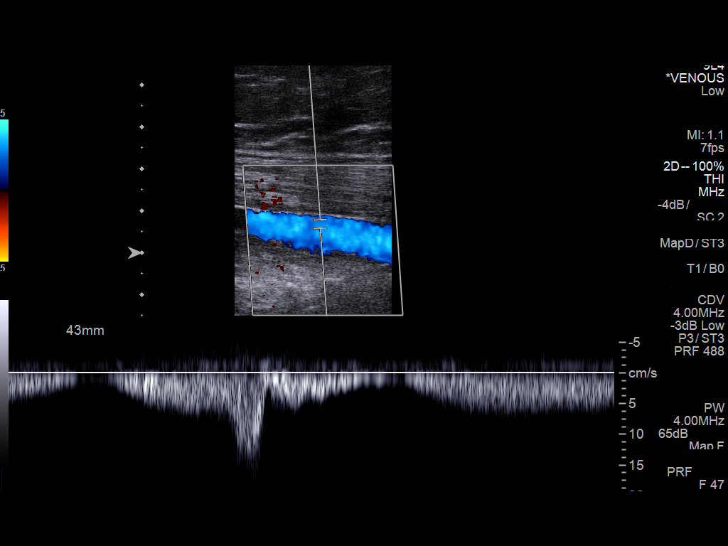
[im 17/26]
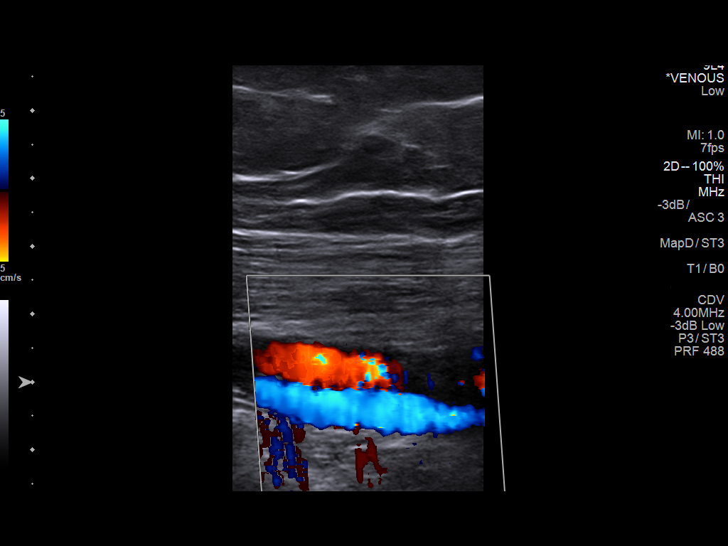
[im 19/26]
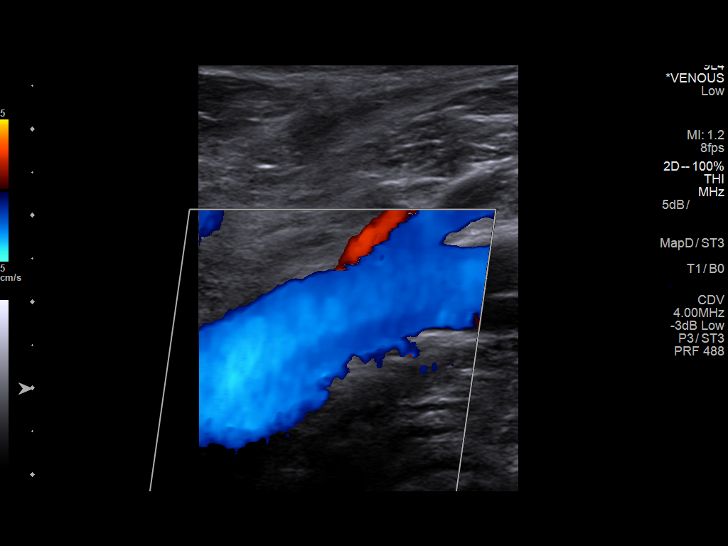
[im 21/26]
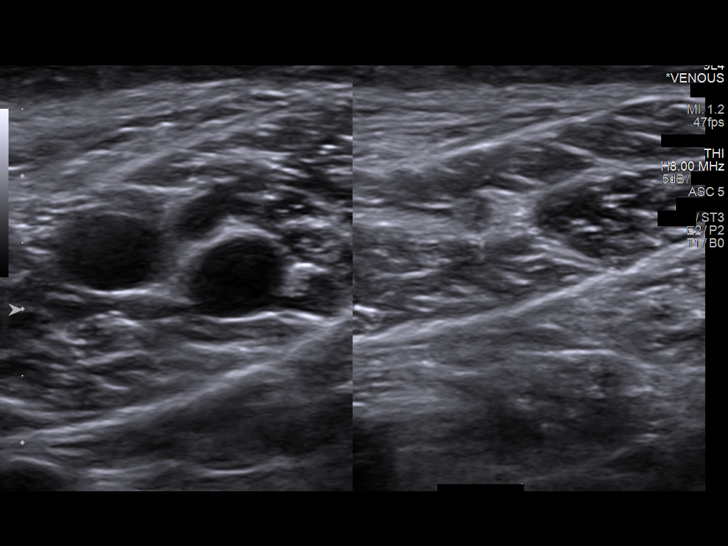
[im 23/26]
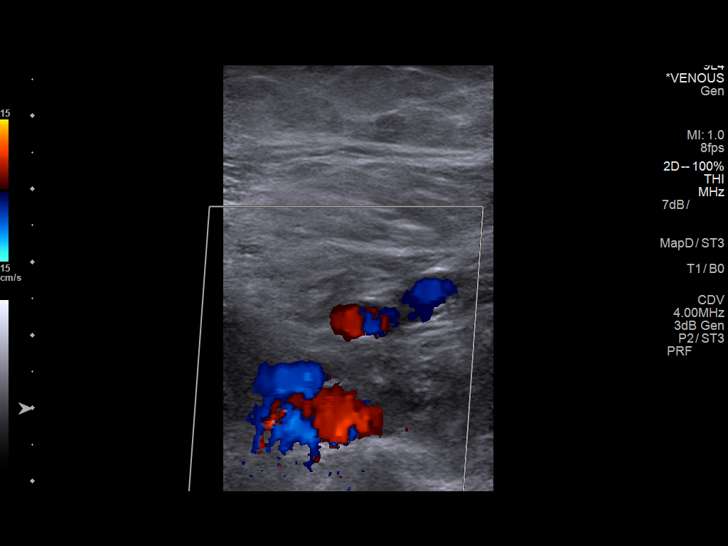
[im 26/26]
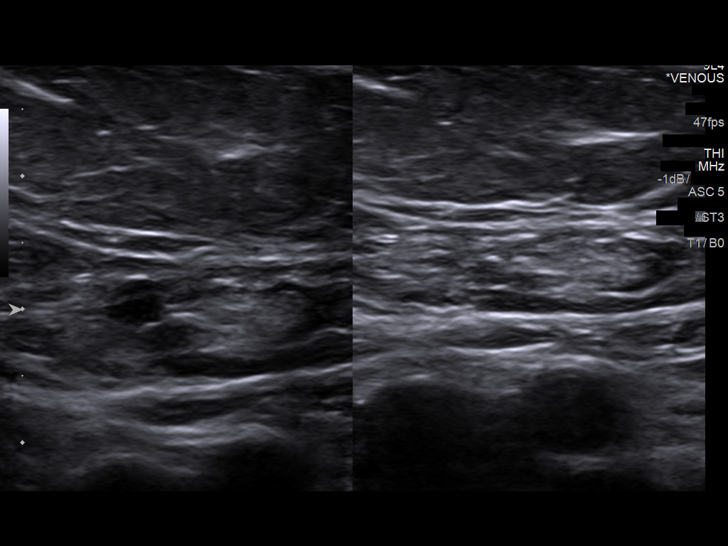

[13 of 24 positions shown; findings below may reference images not displayed]

FINDINGS: Contralateral Common Femoral Vein: Respiratory phasicity is normal
and symmetric with the symptomatic side. No evidence of thrombus.
Normal compressibility.

Common Femoral Vein: No evidence of thrombus. Normal
compressibility, respiratory phasicity and response to augmentation.

Saphenofemoral Junction: No evidence of thrombus. Normal
compressibility and flow on color Doppler imaging.

Profunda Femoral Vein: No evidence of thrombus. Normal
compressibility and flow on color Doppler imaging.

Femoral Vein: No evidence of thrombus. Normal compressibility,
respiratory phasicity and response to augmentation.

Popliteal Vein: No evidence of thrombus. Normal compressibility,
respiratory phasicity and response to augmentation.

Calf Veins: No evidence of thrombus. Normal compressibility and flow
on color Doppler imaging.

Superficial Great Saphenous Vein: No evidence of thrombus. Normal
compressibility.

Venous Reflux:  None.

Other Findings:  None.
IMPRESSION: No evidence of deep venous thrombosis.

## 2019-07-14 ENCOUNTER — Other Ambulatory Visit: Payer: Self-pay

## 2019-07-14 ENCOUNTER — Encounter (HOSPITAL_BASED_OUTPATIENT_CLINIC_OR_DEPARTMENT_OTHER): Payer: Self-pay | Admitting: Emergency Medicine

## 2019-07-14 ENCOUNTER — Emergency Department (HOSPITAL_BASED_OUTPATIENT_CLINIC_OR_DEPARTMENT_OTHER)
Admission: EM | Admit: 2019-07-14 | Discharge: 2019-07-14 | Disposition: A | Discharge status: Home / Self Care | Payer: Self-pay | Attending: Emergency Medicine | Admitting: Emergency Medicine

## 2019-07-14 DIAGNOSIS — I10 Essential (primary) hypertension: Secondary | ICD-10-CM | POA: Insufficient documentation

## 2019-07-14 DIAGNOSIS — J320 Chronic maxillary sinusitis: Secondary | ICD-10-CM | POA: Insufficient documentation

## 2019-07-14 DIAGNOSIS — Z7984 Long term (current) use of oral hypoglycemic drugs: Secondary | ICD-10-CM | POA: Insufficient documentation

## 2019-07-14 DIAGNOSIS — E119 Type 2 diabetes mellitus without complications: Secondary | ICD-10-CM | POA: Insufficient documentation

## 2019-07-14 DIAGNOSIS — Z79899 Other long term (current) drug therapy: Secondary | ICD-10-CM | POA: Insufficient documentation

## 2019-07-14 DIAGNOSIS — R0982 Postnasal drip: Secondary | ICD-10-CM | POA: Insufficient documentation

## 2019-07-14 MED ORDER — DOXYCYCLINE HYCLATE 100 MG PO CAPS: 100.0000 mg | ORAL_CAPSULE | Freq: Every day | ORAL | 0 refills | Status: DC

## 2019-07-14 MED ORDER — DOXYCYCLINE HYCLATE 100 MG PO TABS
200.0000 mg | ORAL_TABLET | Freq: Once | ORAL | Status: AC
  Administered 2019-07-14: 200 mg via ORAL
  Filled 2019-07-14: qty 2

## 2019-07-14 NOTE — ED Triage Notes (Signed)
Patient presents with complaints of sinus pain under eyes and states sore throat onset 1 month ago; states taking otc meds with no relief. States negative covid test 1 week ago; states tested biweekly due to her job. Denies fever or cough.

## 2019-07-14 NOTE — ED Provider Notes (Signed)
[facility] DEPT [facility] Provider Note: Georgena Spurling, MD, FACEP  CSN: [id number] MRN: [id number] ARRIVAL: 07/14/19 at [place]  Facial Pain   HISTORY OF PRESENT ILLNESS  07/14/19 2:10 AM Erin Craig is a 58 y.o. female with chronic sinus congestion for "several months on ".  She has tried controlling it with over-the-counter medications and nasal sprays without relief.  Symptoms worsened over the past month and she is now getting throat discomfort due to postnasal drip.  She has pain primarily in her maxillary sinuses which she rates as a 3 out of 10 but it is irritating enough to keep her from sleeping.  She is tested every other week for COVID-19 and has never tested positive.  Her last test was 1 week ago.   Past Medical History:  Diagnosis Date  . Bradycardia   . Diabetes mellitus without complication ([place])   . Heart murmur   . Hypercholesterolemia   . Hypertension     Past Surgical History:  Procedure Laterality Date  . ABDOMINAL HYSTERECTOMY    . CESAREAN SECTION    . CHOLECYSTECTOMY    . FOOT SURGERY      Family History  Problem Relation Age of Onset  . Alzheimer's disease Other   . Coronary artery disease Other   . Breast cancer Other   . Arthritis Other   . Hypertension Other   . Diabetes Other     Social History   Tobacco Use  . Smoking status: Never Smoker  . Smokeless tobacco: Never Used  Substance Use Topics  . Alcohol use: No  . Drug use: No    Prior to Admission medications   Medication Sig Start Date End Date Taking? Authorizing Provider  acetaminophen (TYLENOL) 325 MG tablet Take 325 mg by mouth every 6 (six) hours as needed. Headache/pain    [provider]  atorvastatin (LIPITOR) 10 MG tablet Take 10 mg by mouth daily.    [provider]  benzonatate (TESSALON) 100 MG capsule Take 1 capsule (100 mg total) by mouth every 8 (eight) hours. 10/06/18   Ripley Fraise, MD   chlorpheniramine-HYDROcodone [facility] PENNKINETIC ER) 10-8 MG/5ML SUER Take 5 mLs by mouth at bedtime as needed for cough. 10/06/18   Ripley Fraise, MD  cyclobenzaprine (FLEXERIL) 10 MG tablet Take 1 tablet (10 mg total) by mouth 3 (three) times daily as needed for muscle spasms. 04/23/19   Veryl Speak, MD  diclofenac sodium (VOLTAREN) 1 % GEL Apply 4 g topically 4 (four) times daily. 12/28/17   Joy, Shawn C, PA-C  Diclofenac Sodium CR (VOLTAREN-XR) 100 MG 24 hr tablet Take 1 tablet (100 mg total) by mouth daily. 12/04/17   Palumbo, April, MD  furosemide (LASIX) 20 MG tablet Take 1 tablet (20 mg total) by mouth daily. 01/09/18   Recardo Evangelist, PA-C  glipiZIDE (GLUCOTROL) 5 MG tablet Take 10 mg by mouth daily before breakfast.    [provider]  ibuprofen (ADVIL) 600 MG tablet Take 1 tablet (600 mg total) by mouth every 6 (six) hours as needed. 12/27/18   Isla Pence, MD  lisinopril (PRINIVIL,ZESTRIL) 5 MG tablet Take 5 mg by mouth daily.    [provider]  metFORMIN (GLUCOPHAGE) 1000 MG tablet Take 1,000 mg by mouth 1 day or 1 dose.    [provider]    Allergies Patient has no known allergies.   REVIEW OF SYSTEMS  Negative except as noted here or in the  History of Present Illness.   PHYSICAL EXAMINATION  Initial Vital Signs Blood pressure (!) 145/56, pulse (!) 52, temperature 98.2 F (36.8 C), temperature source Oral, resp. rate 18, height 5\' 4"  (1.626 m), weight 83 kg, SpO2 99 %.  Examination General: Well-developed, well-nourished female in no acute distress; appearance consistent with age of record HENT: normocephalic; atraumatic; tenderness on percussion of maxillary sinuses and less so on percussion of frontal sinuses; no pharyngeal erythema or exudate Eyes: pupils equal, round and reactive to light; extraocular muscles intact Neck: supple; no lymphadenopathy Heart: regular rate and rhythm Lungs: clear to auscultation bilaterally Abdomen:  soft; nondistended; nontender; bowel sounds present Extremities: No deformity; full range of motion Neurologic: Awake, alert and oriented; motor function intact in all extremities and symmetric; no facial droop Skin: Warm and dry Psychiatric: Normal mood and affect   RESULTS  Summary of this visit's results, reviewed and interpreted by myself:   EKG Interpretation  Date/Time:    Ventricular Rate:    PR Interval:    QRS Duration:   QT Interval:    QTC Calculation:   R Axis:     Text Interpretation:        Laboratory Studies: No results found for this or any previous visit (from the past 24 hour(s)). Imaging Studies: No results found.  ED COURSE and MDM  Nursing notes, initial and subsequent vitals signs, including pulse oximetry, reviewed and interpreted by myself.  Vitals:   07/14/19 0203 07/14/19 0207  BP:  (!) 145/56  Pulse:  (!) 52  Resp:  18  Temp:  98.2 F (36.8 C)  TempSrc:  Oral  SpO2:  99%  Weight: 83 kg   Height: 5\' 4"  (1.626 m)    Medications  doxycycline (VIBRA-TABS) tablet 200 mg (has no administration in time range)    We will treat for chronic sinusitis with a course of doxycycline and ENT referral if symptoms persist.  PROCEDURES  Procedures   ED DIAGNOSES     ICD-10-CM   1. Chronic maxillary sinusitis  J32.0        Halee Glynn, MD 07/14/19 0222

## 2019-07-30 ENCOUNTER — Encounter (HOSPITAL_BASED_OUTPATIENT_CLINIC_OR_DEPARTMENT_OTHER): Payer: Self-pay

## 2019-07-30 ENCOUNTER — Emergency Department (HOSPITAL_BASED_OUTPATIENT_CLINIC_OR_DEPARTMENT_OTHER)
Admission: EM | Admit: 2019-07-30 | Discharge: 2019-07-30 | Disposition: A | Discharge status: Home / Self Care | Payer: Self-pay | Attending: Emergency Medicine | Admitting: Emergency Medicine

## 2019-07-30 ENCOUNTER — Other Ambulatory Visit: Payer: Self-pay

## 2019-07-30 DIAGNOSIS — Z79899 Other long term (current) drug therapy: Secondary | ICD-10-CM | POA: Insufficient documentation

## 2019-07-30 DIAGNOSIS — M62830 Muscle spasm of back: Secondary | ICD-10-CM | POA: Insufficient documentation

## 2019-07-30 DIAGNOSIS — E782 Mixed hyperlipidemia: Secondary | ICD-10-CM | POA: Insufficient documentation

## 2019-07-30 DIAGNOSIS — E119 Type 2 diabetes mellitus without complications: Secondary | ICD-10-CM | POA: Insufficient documentation

## 2019-07-30 DIAGNOSIS — I10 Essential (primary) hypertension: Secondary | ICD-10-CM | POA: Insufficient documentation

## 2019-07-30 MED ORDER — CYCLOBENZAPRINE HCL 10 MG PO TABS: 10.0000 mg | ORAL_TABLET | Freq: Three times a day (TID) | ORAL | 0 refills | Status: DC | PRN

## 2019-07-30 MED ORDER — HYDROCODONE-ACETAMINOPHEN 5-325 MG PO TABS: 1.0000 | ORAL_TABLET | ORAL | 0 refills | Status: DC | PRN

## 2019-07-30 NOTE — ED Triage Notes (Signed)
Pt c/o low back pain without radiation that started yesterday. Denies urinary symptoms.

## 2019-07-30 NOTE — ED Provider Notes (Signed)
[facility] Provider Note: Georgena Spurling, MD, FACEP  CSN: [id number] MRN: [id number] ARRIVAL: 07/30/19 at [place]: [place]  Back Pain   HISTORY OF PRESENT ILLNESS  07/30/19 1:16 AM Erin Craig is a 58 y.o. female with a 2-day history of pain in her left mid paraspinal muscles.  She rates the pain as a 10 out of 10, worse with movement or palpation.  She describes it as feeling like a muscle spasm.  She denies any injury or heavy lifting.  She denies urinary symptoms.   Past Medical History:  Diagnosis Date  . Bradycardia   . Diabetes mellitus without complication ([place])   . Heart murmur   . Hypercholesterolemia   . Hypertension     Past Surgical History:  Procedure Laterality Date  . ABDOMINAL HYSTERECTOMY    . CESAREAN SECTION    . CHOLECYSTECTOMY    . FOOT SURGERY      Family History  Problem Relation Age of Onset  . Alzheimer's disease Other   . Coronary artery disease Other   . Breast cancer Other   . Arthritis Other   . Hypertension Other   . Diabetes Other     Social History   Tobacco Use  . Smoking status: Never Smoker  . Smokeless tobacco: Never Used  Substance Use Topics  . Alcohol use: No  . Drug use: No    Prior to Admission medications   Medication Sig Start Date End Date Taking? Authorizing Provider  cyclobenzaprine (FLEXERIL) 10 MG tablet Take 1 tablet (10 mg total) by mouth 3 (three) times daily as needed for muscle spasms. 07/30/19   Zonie Crutcher, MD  doxycycline (VIBRAMYCIN) 100 MG capsule Take 1 capsule (100 mg total) by mouth daily. 07/14/19   Khalin Royce, MD  HYDROcodone-acetaminophen (NORCO) 5-325 MG tablet Take 1 tablet by mouth every 4 (four) hours as needed for moderate pain. 07/30/19   Brenlee Koskela, MD  atorvastatin (LIPITOR) 10 MG tablet Take 10 mg by mouth daily.  07/14/19  [provider]  furosemide (LASIX) 20 MG tablet Take 1 tablet (20 mg total) by mouth daily. 01/09/18 07/14/19   Recardo Evangelist, PA-C  glipiZIDE (GLUCOTROL) 5 MG tablet Take 10 mg by mouth daily before breakfast.  07/14/19  [provider]  lisinopril (PRINIVIL,ZESTRIL) 5 MG tablet Take 5 mg by mouth daily.  07/14/19  [provider]  metFORMIN (GLUCOPHAGE) 1000 MG tablet Take 1,000 mg by mouth 1 day or 1 dose.  07/14/19  [provider]    Allergies Patient has no known allergies.   REVIEW OF SYSTEMS  Negative except as noted here or in the History of Present Illness.   PHYSICAL EXAMINATION  Initial Vital Signs Blood pressure 114/83, pulse (!) 57, temperature 98 F (36.7 C), temperature source Oral, resp. rate 16, height 5\' 4"  (1.626 m), weight 83.9 kg, SpO2 97 %.  Examination General: Well-developed, well-nourished female in no acute distress; appearance consistent with age of record HENT: normocephalic; atraumatic Eyes: Normal appearance Neck: supple Heart: regular rate and rhythm Lungs: clear to auscultation bilaterally Abdomen: soft; nondistended; nontender; bowel sounds present Back: Tender left mid back paraspinous muscles with palpable spasm Extremities: No deformity; full range of motion; pulses normal Neurologic: Awake, alert and oriented; motor function intact in all extremities and symmetric; no facial droop Skin: Warm and dry Psychiatric: Normal mood and affect   RESULTS  Summary of this visit's results, reviewed and interpreted by myself:  EKG Interpretation  Date/Time:    Ventricular Rate:    PR Interval:    QRS Duration:   QT Interval:    QTC Calculation:   R Axis:     Text Interpretation:        Laboratory Studies: No results found for this or any previous visit (from the past 24 hour(s)). Imaging Studies: No results found.  ED COURSE and MDM  Nursing notes, initial and subsequent vitals signs, including pulse oximetry, reviewed and interpreted by myself.  Vitals:   07/30/19 0052 07/30/19 0053  BP:  114/83  Pulse:  (!) 57   Resp:  16  Temp:  98 F (36.7 C)  TempSrc:  Oral  SpO2:  97%  Weight: 83.9 kg   Height: 5\' 4"  (1.626 m)    Medications - No data to display  Examination consistent with muscle spasm of the back.  PROCEDURES     ED DIAGNOSES     ICD-10-CM   1. Spasm of back muscles  M62.830        Marcellous Snarski, MD 07/30/19 [phone]

## 2019-07-30 NOTE — ED Notes (Signed)
ED Provider at bedside. Molpus MD. 

## 2019-08-08 ENCOUNTER — Other Ambulatory Visit: Payer: Self-pay

## 2019-08-08 ENCOUNTER — Encounter (HOSPITAL_BASED_OUTPATIENT_CLINIC_OR_DEPARTMENT_OTHER): Payer: Self-pay | Admitting: Emergency Medicine

## 2019-08-08 ENCOUNTER — Emergency Department (HOSPITAL_BASED_OUTPATIENT_CLINIC_OR_DEPARTMENT_OTHER)
Admission: EM | Admit: 2019-08-08 | Discharge: 2019-08-08 | Disposition: A | Discharge status: Home / Self Care | Payer: Self-pay | Attending: Emergency Medicine | Admitting: Emergency Medicine

## 2019-08-08 DIAGNOSIS — E119 Type 2 diabetes mellitus without complications: Secondary | ICD-10-CM | POA: Insufficient documentation

## 2019-08-08 DIAGNOSIS — R600 Localized edema: Secondary | ICD-10-CM

## 2019-08-08 DIAGNOSIS — L03116 Cellulitis of left lower limb: Secondary | ICD-10-CM

## 2019-08-08 DIAGNOSIS — R2242 Localized swelling, mass and lump, left lower limb: Secondary | ICD-10-CM | POA: Insufficient documentation

## 2019-08-08 DIAGNOSIS — I10 Essential (primary) hypertension: Secondary | ICD-10-CM | POA: Insufficient documentation

## 2019-08-08 MED ORDER — CEPHALEXIN 500 MG PO CAPS: 500.0000 mg | ORAL_CAPSULE | Freq: Two times a day (BID) | ORAL | 0 refills | Status: DC

## 2019-08-08 NOTE — Discharge Instructions (Addendum)
Keep your left leg elevated.  If you have any worsening swelling, redness, pain in the left leg in the next 3 to 4 days please return to the ER. If you have any swelling that extends into your thigh or worsens in the left leg please return to ER for an ultrasound

## 2019-08-08 NOTE — ED Triage Notes (Signed)
Pt c/o swelling to left lower leg after getting tattoo on left calf 4 days ago.

## 2019-08-08 NOTE — ED Notes (Signed)
Pt ambulatory to treatment room with steady gait. Pt denies fevers at this time. Pt reports washing tattoo with soap and water and applying ointment.

## 2019-08-08 NOTE — ED Provider Notes (Signed)
[facility] EMERGENCY DEPARTMENT Provider Note   CSN: [id number] Arrival date & time: 08/08/19  0245     History Chief Complaint  Patient presents with  . Leg Swelling    Erin Craig is a 58 y.o. female.  The history is provided by the patient.  Leg Pain Location:  Leg Leg location:  L lower leg Pain details:    Quality:  Aching   Severity:  Mild   Onset quality:  Gradual   Duration:  4 days   Timing:  Constant   Progression:  Worsening Chronicity:  New Relieved by:  Nothing Worsened by:  Bearing weight Associated symptoms: no fever   Patient had a tattoo placed on her left calf over 4 days ago.  Since that time she had pain, redness and swelling.  The pain is localized around the tattoo.  No fevers or vomiting.  No chest pain shortness of breath.  Denies history of VTE     Past Medical History:  Diagnosis Date  . Bradycardia   . Diabetes mellitus without complication ([facility])   . Heart murmur   . Hypercholesterolemia   . Hypertension     There are no problems to display for this patient.   Past Surgical History:  Procedure Laterality Date  . ABDOMINAL HYSTERECTOMY    . CESAREAN SECTION    . CHOLECYSTECTOMY    . FOOT SURGERY       OB History   No obstetric history on file.     Family History  Problem Relation Age of Onset  . Alzheimer's disease Other   . Coronary artery disease Other   . Breast cancer Other   . Arthritis Other   . Hypertension Other   . Diabetes Other     Social History   Tobacco Use  . Smoking status: Never Smoker  . Smokeless tobacco: Never Used  Substance Use Topics  . Alcohol use: No  . Drug use: No    Home Medications Prior to Admission medications   Medication Sig Start Date End Date Taking? Authorizing Provider  cephALEXin (KEFLEX) 500 MG capsule Take 1 capsule (500 mg total) by mouth 2 (two) times daily. 08/08/19   Zadie Rhine, MD  cyclobenzaprine (FLEXERIL) 10 MG tablet Take 1 tablet (10 mg  total) by mouth 3 (three) times daily as needed for muscle spasms. 07/30/19   Molpus, John, MD  HYDROcodone-acetaminophen (NORCO) 5-325 MG tablet Take 1 tablet by mouth every 4 (four) hours as needed for moderate pain. 07/30/19   Molpus, John, MD  atorvastatin (LIPITOR) 10 MG tablet Take 10 mg by mouth daily.  07/14/19  [provider]  furosemide (LASIX) 20 MG tablet Take 1 tablet (20 mg total) by mouth daily. 01/09/18 07/14/19  Bethel Born, PA-C  glipiZIDE (GLUCOTROL) 5 MG tablet Take 10 mg by mouth daily before breakfast.  07/14/19  [provider]  lisinopril (PRINIVIL,ZESTRIL) 5 MG tablet Take 5 mg by mouth daily.  07/14/19  [provider]  metFORMIN (GLUCOPHAGE) 1000 MG tablet Take 1,000 mg by mouth 1 day or 1 dose.  07/14/19  [provider]    Allergies    Patient has no known allergies.  Review of Systems   Review of Systems  Constitutional: Negative for fever.  Respiratory: Negative for shortness of breath.   Cardiovascular: Negative for chest pain.  Skin: Positive for wound.  All other systems reviewed and are negative.   Physical Exam Updated Vital Signs BP (!) 118/54 (  BP Location: Right Arm)   Pulse 65   Temp 98.4 F (36.9 C) (Oral)   Resp 16   Ht 1.626 m (5\' 4" )   Wt 83.9 kg   LMP  (LMP Unknown)   SpO2 98%   BMI 31.76 kg/m   Physical Exam CONSTITUTIONAL: Well developed/well nourished HEAD: Normocephalic/atraumatic EYES: EOMI ENMT: Mask in place NECK: supple no meningeal signs CV: S1/S2 noted, no murmurs/rubs/gallops noted LUNGS: Lungs are clear to auscultation bilaterally, no apparent distress ABDOMEN: soft NEURO: Pt is awake/alert/appropriate, moves all extremitiesx4.  No facial droop.   EXTREMITIES: pulses normal/equal, full ROM, mild pain edema to left lower extremity peer distal pulses intact.  Mild erythema noted around tattoo.  There is no medial calf tenderness to left leg.  See photo below.  No crepitus.  No  drainage. No fluctuance SKIN: warm, color normal PSYCH: no abnormalities of mood noted, alert and oriented to situation    Patient gave verbal permission to utilize photo for medical documentation only The image was not stored on any personal device  ED Results / Procedures / Treatments   Labs (all labs ordered are listed, but only abnormal results are displayed) Labs Reviewed - No data to display  EKG None  Radiology No results found.  Procedures Procedures    Medications Ordered in ED Medications - No data to display  ED Course  I have reviewed the triage vital signs and the nursing notes.      MDM Rules/Calculators/A&P                      Patient may have an early infection surrounding the tattoo.  No crepitus or abscess.  She has no medial calf tenderness, and all of her symptoms started after the tattoo.  Low suspicion for DVT.  We will start her on Keflex.  We discussed strict return precautions Final Clinical Impression(s) / ED Diagnoses Final diagnoses:  Leg edema  Cellulitis of left lower extremity    Rx / DC Orders ED Discharge Orders         Ordered    cephALEXin (KEFLEX) 500 MG capsule  2 times daily     08/08/19 0543           Ripley Fraise, MD 08/08/19 [phone]

## 2019-10-19 ENCOUNTER — Other Ambulatory Visit: Payer: Self-pay

## 2019-10-19 ENCOUNTER — Encounter (HOSPITAL_BASED_OUTPATIENT_CLINIC_OR_DEPARTMENT_OTHER): Payer: Self-pay

## 2019-10-19 ENCOUNTER — Emergency Department (HOSPITAL_BASED_OUTPATIENT_CLINIC_OR_DEPARTMENT_OTHER)
Admission: EM | Admit: 2019-10-19 | Discharge: 2019-10-19 | Disposition: A | Discharge status: Home / Self Care | Payer: 59 | Attending: Emergency Medicine | Admitting: Emergency Medicine

## 2019-10-19 DIAGNOSIS — Z79899 Other long term (current) drug therapy: Secondary | ICD-10-CM | POA: Diagnosis not present

## 2019-10-19 DIAGNOSIS — I10 Essential (primary) hypertension: Secondary | ICD-10-CM | POA: Insufficient documentation

## 2019-10-19 DIAGNOSIS — Z7984 Long term (current) use of oral hypoglycemic drugs: Secondary | ICD-10-CM | POA: Insufficient documentation

## 2019-10-19 DIAGNOSIS — E119 Type 2 diabetes mellitus without complications: Secondary | ICD-10-CM | POA: Insufficient documentation

## 2019-10-19 DIAGNOSIS — J019 Acute sinusitis, unspecified: Secondary | ICD-10-CM

## 2019-10-19 DIAGNOSIS — R05 Cough: Secondary | ICD-10-CM | POA: Diagnosis present

## 2019-10-19 DIAGNOSIS — B9689 Other specified bacterial agents as the cause of diseases classified elsewhere: Secondary | ICD-10-CM

## 2019-10-19 DIAGNOSIS — J018 Other acute sinusitis: Secondary | ICD-10-CM | POA: Insufficient documentation

## 2019-10-19 DIAGNOSIS — E782 Mixed hyperlipidemia: Secondary | ICD-10-CM | POA: Insufficient documentation

## 2019-10-19 MED ORDER — AMOXICILLIN-POT CLAVULANATE 875-125 MG PO TABS
1.0000 | ORAL_TABLET | Freq: Once | ORAL | Status: AC
  Administered 2019-10-19: 1 via ORAL
  Filled 2019-10-19: qty 1

## 2019-10-19 MED ORDER — AMOXICILLIN-POT CLAVULANATE 875-125 MG PO TABS: 1.0000 | ORAL_TABLET | Freq: Two times a day (BID) | ORAL | 0 refills | Status: DC

## 2019-10-19 NOTE — ED Provider Notes (Signed)
[facility] EMERGENCY DEPARTMENT Provider Note   CSN: [id number] Arrival date & time: 10/19/19  1913     History Chief Complaint  Patient presents with  . Cough    Erin Craig is a 59 y.o. female.  HPI 59 year old female presents with sinus congestion, headache, cough.  Ongoing for 2+ weeks.  Has had intermittent nosebleeds, green and yellow sputum.  Headache and sinus pressure.  Has a dry cough without shortness of breath.  No fevers during this time.  Negative [facility] test 2 days ago [facility]. Has had sore throat.   Past Medical History:  Diagnosis Date  . Bradycardia   . Diabetes mellitus without complication ([place])   . Heart murmur   . Hypercholesterolemia   . Hypertension     There are no problems to display for this patient.   Past Surgical History:  Procedure Laterality Date  . ABDOMINAL HYSTERECTOMY    . CESAREAN SECTION    . CHOLECYSTECTOMY    . FOOT SURGERY       OB History   No obstetric history on file.     Family History  Problem Relation Age of Onset  . Alzheimer's disease Other   . Coronary artery disease Other   . Breast cancer Other   . Arthritis Other   . Hypertension Other   . Diabetes Other     Social History   Tobacco Use  . Smoking status: Never Smoker  . Smokeless tobacco: Never Used  Substance Use Topics  . Alcohol use: No  . Drug use: No    Home Medications Prior to Admission medications   Medication Sig Start Date End Date Taking? Authorizing Provider  amoxicillin-clavulanate (AUGMENTIN) 875-125 MG tablet Take 1 tablet by mouth 2 (two) times daily. One po bid x 7 days 10/19/19   Sherwood Gambler, MD  atorvastatin (LIPITOR) 10 MG tablet Take 10 mg by mouth daily.  07/14/19  [provider]  furosemide (LASIX) 20 MG tablet Take 1 tablet (20 mg total) by mouth daily. 01/09/18 07/14/19  Recardo Evangelist, PA-C  glipiZIDE (GLUCOTROL) 5 MG tablet Take 10 mg by mouth daily before breakfast.  07/14/19  [provider]  lisinopril (PRINIVIL,ZESTRIL) 5 MG tablet Take 5 mg by mouth daily.  07/14/19  [provider]  metFORMIN (GLUCOPHAGE) 1000 MG tablet Take 1,000 mg by mouth 1 day or 1 dose.  07/14/19  [provider]    Allergies    Patient has no known allergies.  Review of Systems   Review of Systems  Constitutional: Negative for fever.  HENT: Positive for congestion, sinus pain and sore throat.   Respiratory: Positive for cough. Negative for shortness of breath.   Neurological: Positive for headaches.  All other systems reviewed and are negative.   Physical Exam Updated Vital Signs BP 124/63 (BP Location: Left Arm)   Pulse 60   Temp 98.1 F (36.7 C) (Oral)   Resp 16   Ht 5\' 4"  (1.626 m)   Wt 85 kg   LMP  (LMP Unknown)   SpO2 100%   BMI 32.15 kg/m   Physical Exam Vitals and nursing note reviewed.  Constitutional:      Appearance: She is well-developed.  HENT:     Head: Normocephalic and atraumatic.     Right Ear: External ear normal.     Left Ear: External ear normal.     Nose:     Right Sinus: Maxillary sinus tenderness and frontal sinus  tenderness present.     Left Sinus: Maxillary sinus tenderness and frontal sinus tenderness present.     Comments: Dried blood inside right nare    Mouth/Throat:     Pharynx: No oropharyngeal exudate or posterior oropharyngeal erythema.     Tonsils: No tonsillar exudate or tonsillar abscesses.  Eyes:     General:        Right eye: No discharge.        Left eye: No discharge.  Cardiovascular:     Rate and Rhythm: Normal rate and regular rhythm.     Heart sounds: Normal heart sounds.  Pulmonary:     Effort: Pulmonary effort is normal.     Breath sounds: Normal breath sounds.  Abdominal:     Palpations: Abdomen is soft.     Tenderness: There is no abdominal tenderness.  Skin:    General: Skin is warm and dry.  Neurological:     Mental Status: She is alert.  Psychiatric:        Mood and Affect: Mood is  not anxious.     ED Results / Procedures / Treatments   Labs (all labs ordered are listed, but only abnormal results are displayed) Labs Reviewed - No data to display  EKG None  Radiology No results found.  Procedures Procedures (including critical care time)  Medications Ordered in ED Medications - No data to display  ED Course  I have reviewed the triage vital signs and the nursing notes.  Pertinent labs & imaging results that were available during my care of the patient were reviewed by me and considered in my medical decision making (see chart for details).    MDM Rules/Calculators/A&P                      Patient appears to have sinusitis.  Given her diabetes history and prolonged course, will treat with antibiotics.  Otherwise, she appears well.  Offered repeat Covid testing here but she declines.  Offered chest x-ray but she also declines this which is reasonable given clear lungs and no shortness of breath or fever.  Discharged home to follow-up with PCP. Final Clinical Impression(s) / ED Diagnoses Final diagnoses:  Acute bacterial sinusitis    Rx / DC Orders ED Discharge Orders         Ordered    amoxicillin-clavulanate (AUGMENTIN) 875-125 MG tablet  2 times daily     10/19/19 2034           Pricilla Loveless, MD 10/19/19 2038

## 2019-10-19 NOTE — ED Triage Notes (Signed)
Pt c/o flu sx x 1 week-states she [facility] neg [facility] at [facility] this [facility]-NAD-steady gait

## 2019-11-03 ENCOUNTER — Other Ambulatory Visit: Payer: Self-pay

## 2019-11-03 ENCOUNTER — Emergency Department (HOSPITAL_BASED_OUTPATIENT_CLINIC_OR_DEPARTMENT_OTHER): Payer: 59

## 2019-11-03 ENCOUNTER — Encounter (HOSPITAL_BASED_OUTPATIENT_CLINIC_OR_DEPARTMENT_OTHER): Payer: Self-pay | Admitting: Emergency Medicine

## 2019-11-03 ENCOUNTER — Emergency Department (HOSPITAL_BASED_OUTPATIENT_CLINIC_OR_DEPARTMENT_OTHER)
Admission: EM | Admit: 2019-11-03 | Discharge: 2019-11-03 | Disposition: A | Discharge status: Home / Self Care | Payer: 59 | Attending: Emergency Medicine | Admitting: Emergency Medicine

## 2019-11-03 DIAGNOSIS — E119 Type 2 diabetes mellitus without complications: Secondary | ICD-10-CM | POA: Diagnosis not present

## 2019-11-03 DIAGNOSIS — I10 Essential (primary) hypertension: Secondary | ICD-10-CM | POA: Insufficient documentation

## 2019-11-03 DIAGNOSIS — R05 Cough: Secondary | ICD-10-CM | POA: Diagnosis not present

## 2019-11-03 DIAGNOSIS — R509 Fever, unspecified: Secondary | ICD-10-CM | POA: Insufficient documentation

## 2019-11-03 DIAGNOSIS — M791 Myalgia, unspecified site: Secondary | ICD-10-CM | POA: Insufficient documentation

## 2019-11-03 DIAGNOSIS — Z7984 Long term (current) use of oral hypoglycemic drugs: Secondary | ICD-10-CM | POA: Insufficient documentation

## 2019-11-03 DIAGNOSIS — Z20822 Contact with and (suspected) exposure to covid-19: Secondary | ICD-10-CM | POA: Diagnosis not present

## 2019-11-03 DIAGNOSIS — R059 Cough, unspecified: Secondary | ICD-10-CM

## 2019-11-03 LAB — URINALYSIS, ROUTINE W REFLEX MICROSCOPIC
Bilirubin Urine: NEGATIVE
Glucose, UA: NEGATIVE mg/dL
Ketones, ur: NEGATIVE mg/dL
Leukocytes,Ua: NEGATIVE
Nitrite: NEGATIVE
Protein, ur: NEGATIVE mg/dL
Specific Gravity, Urine: 1.025 (ref 1.005–1.030)
pH: 6 (ref 5.0–8.0)

## 2019-11-03 LAB — SARS CORONAVIRUS 2 (TAT 6-24 HRS): SARS Coronavirus 2: NEGATIVE

## 2019-11-03 LAB — URINALYSIS, MICROSCOPIC (REFLEX)

## 2019-11-03 MED ORDER — ACETAMINOPHEN 500 MG PO TABS
1000.0000 mg | ORAL_TABLET | Freq: Once | ORAL | Status: AC
  Administered 2019-11-03: 1000 mg via ORAL
  Filled 2019-11-03: qty 2

## 2019-11-03 NOTE — Discharge Instructions (Signed)
Person Under Monitoring Name: Erin Craig  Location: [address] [place] [address]   Infection Prevention Recommendations for Individuals Confirmed to have, or Being Evaluated for, 2019 Novel Coronavirus (COVID-19) Infection Who Receive Care at Home  Individuals who are confirmed to have, or are being evaluated for, COVID-19 should follow the prevention steps below until a healthcare provider or local or state health department says they can return to normal activities.  Stay home except to get medical care You should restrict activities outside your home, except for getting medical care. Do not go to work, school, or public areas, and do not use public transportation or taxis.  Call ahead before visiting your doctor Before your medical appointment, call the healthcare provider and tell them that you have, or are being evaluated for, COVID-19 infection. This will help the healthcare provider's office take steps to keep other people from getting infected. Ask your healthcare provider to call the local or state health department.  Monitor your symptoms Seek prompt medical attention if your illness is worsening (e.g., difficulty breathing). Before going to your medical appointment, call the healthcare provider and tell them that you have, or are being evaluated for, COVID-19 infection. Ask your healthcare provider to call the local or state health department.  Wear a facemask You should wear a facemask that covers your nose and mouth when you are in the same room with other people and when you visit a healthcare provider. People who live with or visit you should also wear a facemask while they are in the same room with you.  Separate yourself from other people in your home As much as possible, you should stay in a different room from other people in your home. Also, you should use a separate bathroom, if available.  Avoid sharing household items You should  not share dishes, drinking glasses, cups, eating utensils, towels, bedding, or other items with other people in your home. After using these items, you should wash them thoroughly with soap and water.  Cover your coughs and sneezes Cover your mouth and nose with a tissue when you cough or sneeze, or you can cough or sneeze into your sleeve. Throw used tissues in a lined trash can, and immediately wash your hands with soap and water for at least 20 seconds or use an alcohol-based hand rub.  Wash your [employer] your hands often and thoroughly with soap and water for at least 20 seconds. You can use an alcohol-based hand sanitizer if soap and water are not available and if your hands are not visibly dirty. Avoid touching your eyes, nose, and mouth with unwashed hands.   Prevention Steps for Caregivers and Household Members of Individuals Confirmed to have, or Being Evaluated for, COVID-19 Infection Being Cared for in the Home  If you live with, or provide care at home for, a person confirmed to have, or being evaluated for, COVID-19 infection please follow these guidelines to prevent infection:  Follow healthcare provider's instructions Make sure that you understand and can help the patient follow any healthcare provider instructions for all care.  Provide for the patient's basic needs You should help the patient with basic needs in the home and provide support for getting groceries, prescriptions, and other personal needs.  Monitor the patient's symptoms If they are getting sicker, call his or her medical provider and tell them that the patient has, or is being evaluated for, COVID-19 infection. This will help the healthcare  provider's office take steps to keep other people from getting infected. Ask the healthcare provider to call the local or state health department.  Limit the number of people who have contact with the patient If possible, have only one caregiver for the  patient. Other household members should stay in another home or place of residence. If this is not possible, they should stay in another room, or be separated from the patient as much as possible. Use a separate bathroom, if available. Restrict visitors who do not have an essential need to be in the home.  Keep older adults, very young children, and other sick people away from the patient Keep older adults, very young children, and those who have compromised immune systems or chronic health conditions away from the patient. This includes people with chronic heart, lung, or kidney conditions, diabetes, and cancer.  Ensure good ventilation Make sure that shared spaces in the home have good air flow, such as from an air conditioner or an opened window, weather permitting.  Wash your hands often Wash your hands often and thoroughly with soap and water for at least 20 seconds. You can use an alcohol based hand sanitizer if soap and water are not available and if your hands are not visibly dirty. Avoid touching your eyes, nose, and mouth with unwashed hands. Use disposable paper towels to dry your hands. If not available, use dedicated cloth towels and replace them when they become wet.  Wear a facemask and gloves Wear a disposable facemask at all times in the room and gloves when you touch or have contact with the patient's blood, body fluids, and/or secretions or excretions, such as sweat, saliva, sputum, nasal mucus, vomit, urine, or feces.  Ensure the mask fits over your nose and mouth tightly, and do not touch it during use. Throw out disposable facemasks and gloves after using them. Do not reuse. Wash your hands immediately after removing your facemask and gloves. If your personal clothing becomes contaminated, carefully remove clothing and launder. Wash your hands after handling contaminated clothing. Place all used disposable facemasks, gloves, and other waste in a lined container before  disposing them with other household waste. Remove gloves and wash your hands immediately after handling these items.  Do not share dishes, glasses, or other household items with the patient Avoid sharing household items. You should not share dishes, drinking glasses, cups, eating utensils, towels, bedding, or other items with a patient who is confirmed to have, or being evaluated for, COVID-19 infection. After the person uses these items, you should wash them thoroughly with soap and water.  Wash laundry thoroughly Immediately remove and wash clothes or bedding that have blood, body fluids, and/or secretions or excretions, such as sweat, saliva, sputum, nasal mucus, vomit, urine, or feces, on them. Wear gloves when handling laundry from the patient. Read and follow directions on labels of laundry or clothing items and detergent. In general, wash and dry with the warmest temperatures recommended on the label.  Clean all areas the individual has used often Clean all touchable surfaces, such as counters, tabletops, doorknobs, bathroom fixtures, toilets, phones, keyboards, tablets, and bedside tables, every day. Also, clean any surfaces that may have blood, body fluids, and/or secretions or excretions on them. Wear gloves when cleaning surfaces the patient has come in contact with. Use a diluted bleach solution (e.g., dilute bleach with 1 part bleach and 10 parts water) or a household disinfectant with a label that says EPA-registered for coronaviruses. To make  a bleach solution at home, add 1 tablespoon of bleach to 1 quart (4 cups) of water. For a larger supply, add  cup of bleach to 1 gallon (16 cups) of water. Read labels of cleaning products and follow recommendations provided on product labels. Labels contain instructions for safe and effective use of the cleaning product including precautions you should take when applying the product, such as wearing gloves or eye protection and making sure you  have good ventilation during use of the product. Remove gloves and wash hands immediately after cleaning.  Monitor yourself for signs and symptoms of illness Caregivers and household members are considered close contacts, should monitor their health, and will be asked to limit movement outside of the home to the extent possible. Follow the monitoring steps for close contacts listed on the symptom monitoring form.   ? If you have additional questions, contact your local health department or call the epidemiologist on call at [phone] (available 24/7). ? This guidance is subject to change. For the most up-to-date guidance from [facility], please refer to their website: [email or web address]

## 2019-11-03 NOTE — ED Notes (Signed)
Attempted x2 to collect COVID specimen - pt did not tolerate well, pulled swab out of her nose. Was unable to swab for 20 seconds due to intolerance. Results likely inaccurate.

## 2019-11-03 NOTE — ED Notes (Signed)
Upon further interview, pt did not have COVID vaccine - was talking about flu vaccine

## 2019-11-03 NOTE — ED Triage Notes (Signed)
Patient presents with complaints of fever and generalized bodyaches x 2 days; states recent negative covid test and states she has had both series of covid vaccine.

## 2019-11-03 NOTE — ED Provider Notes (Signed)
[facility] EMERGENCY DEPARTMENT Provider Note   CSN: [id number] Arrival date & time: 11/03/19  0254     History Chief Complaint  Patient presents with  . Fever    Erin Craig is a 59 y.o. female.  The history is provided by the patient.  URI Presenting symptoms: cough   Presenting symptoms: no ear pain, no facial pain and no fever   Severity:  Moderate Onset quality:  Gradual Timing:  Sporadic Progression:  Waxing and waning Chronicity:  New Relieved by:  Nothing Worsened by:  Nothing Ineffective treatments:  None tried Associated symptoms: myalgias   Associated symptoms: no arthralgias, no headaches, no neck pain, no sinus pain, no sneezing, no swollen glands and no wheezing   Risk factors: not elderly   Patient presents with dry cough and bodyaches and feeling feverish for 2 days without documented fever.  Originally stated she had both covid vaccines but had flu vaccine recently and is due in [facility] for 2nd dose.  Unknown exposures.  No n/v/d.  No rashes on the skin.  No SOB.       Past Medical History:  Diagnosis Date  . Bradycardia   . Diabetes mellitus without complication ([place])   . Heart murmur   . Hypercholesterolemia   . Hypertension     There are no problems to display for this patient.   Past Surgical History:  Procedure Laterality Date  . ABDOMINAL HYSTERECTOMY    . CESAREAN SECTION    . CHOLECYSTECTOMY    . FOOT SURGERY       OB History   No obstetric history on file.     Family History  Problem Relation Age of Onset  . Alzheimer's disease Other   . Coronary artery disease Other   . Breast cancer Other   . Arthritis Other   . Hypertension Other   . Diabetes Other     Social History   Tobacco Use  . Smoking status: Never Smoker  . Smokeless tobacco: Never Used  Substance Use Topics  . Alcohol use: No  . Drug use: No    Home Medications Prior to Admission medications   Medication Sig Start Date End Date Taking?  Authorizing Provider  cyanocobalamin (,VITAMIN B-12,) 1000 MCG/ML injection INJECT 1ML IM EVERY MONTHLY 04/07/16  Yes [provider]  metFORMIN (GLUCOPHAGE) 500 MG tablet Take by mouth. 09/29/16  Yes [provider]  amoxicillin-clavulanate (AUGMENTIN) 875-125 MG tablet Take 1 tablet by mouth 2 (two) times daily. One po bid x 7 days 10/19/19   Sherwood Gambler, MD  atorvastatin (LIPITOR) 10 MG tablet Take 10 mg by mouth daily.  07/14/19  [provider]  furosemide (LASIX) 20 MG tablet Take 1 tablet (20 mg total) by mouth daily. 01/09/18 07/14/19  Recardo Evangelist, PA-C  glipiZIDE (GLUCOTROL) 5 MG tablet Take 10 mg by mouth daily before breakfast.  07/14/19  [provider]  lisinopril (PRINIVIL,ZESTRIL) 5 MG tablet Take 5 mg by mouth daily.  07/14/19  [provider]    Allergies    Patient has no known allergies.  Review of Systems   Review of Systems  Constitutional: Negative for appetite change, diaphoresis and fever.  HENT: Negative for ear pain, sinus pain and sneezing.   Eyes: Negative for visual disturbance.  Respiratory: Positive for cough. Negative for shortness of breath and wheezing.   Cardiovascular: Negative for chest pain, palpitations and leg swelling.  Gastrointestinal: Negative for diarrhea and nausea.  Genitourinary: Negative  for difficulty urinating.  Musculoskeletal: Positive for myalgias. Negative for arthralgias, neck pain and neck stiffness.  Skin: Negative for rash.  Neurological: Negative for weakness and headaches.  Psychiatric/Behavioral: Negative for agitation.  All other systems reviewed and are negative.   Physical Exam Updated Vital Signs BP 121/80 (BP Location: Right Arm)   Pulse (!) 50   Temp 98.1 F (36.7 C) (Oral)   Resp 16   Ht 5\' 4"  (1.626 m)   Wt 85 kg   LMP  (LMP Unknown)   SpO2 100%   BMI 32.17 kg/m   Physical Exam Vitals and nursing note reviewed.  Constitutional:      General: She is not in  acute distress.    Appearance: Normal appearance.  HENT:     Head: Normocephalic and atraumatic.     Nose: Nose normal.  Eyes:     Conjunctiva/sclera: Conjunctivae normal.     Pupils: Pupils are equal, round, and reactive to light.  Cardiovascular:     Rate and Rhythm: Normal rate and regular rhythm.     Pulses: Normal pulses.     Heart sounds: Normal heart sounds.  Pulmonary:     Effort: Pulmonary effort is normal.     Breath sounds: Normal breath sounds.  Abdominal:     General: Abdomen is flat. Bowel sounds are normal.     Tenderness: There is no abdominal tenderness. There is no guarding.  Musculoskeletal:        General: Normal range of motion.     Cervical back: Normal range of motion and neck supple.  Skin:    General: Skin is warm and dry.     Capillary Refill: Capillary refill takes less than 2 seconds.  Neurological:     General: No focal deficit present.     Mental Status: She is alert and oriented to person, place, and time.     Deep Tendon Reflexes: Reflexes normal.  Psychiatric:        Mood and Affect: Mood normal.        Behavior: Behavior normal.     ED Results / Procedures / Treatments   Labs (all labs ordered are listed, but only abnormal results are displayed) Results for orders placed or performed during the hospital encounter of 11/03/19  Urinalysis, Routine w reflex microscopic  Result Value Ref Range   Color, Urine YELLOW YELLOW   APPearance CLEAR CLEAR   Specific Gravity, Urine 1.025 1.005 - 1.030   pH 6.0 5.0 - 8.0   Glucose, UA NEGATIVE NEGATIVE mg/dL   Hgb urine dipstick TRACE (A) NEGATIVE   Bilirubin Urine NEGATIVE NEGATIVE   Ketones, ur NEGATIVE NEGATIVE mg/dL   Protein, ur NEGATIVE NEGATIVE mg/dL   Nitrite NEGATIVE NEGATIVE   Leukocytes,Ua NEGATIVE NEGATIVE  Urinalysis, Microscopic (reflex)  Result Value Ref Range   RBC / HPF 0-5 0 - 5 RBC/hpf   WBC, UA 0-5 0 - 5 WBC/hpf   Bacteria, UA RARE (A) NONE SEEN   Squamous Epithelial / LPF  0-5 0 - 5   DG Chest Portable 1 View  Result Date: 11/03/2019 CLINICAL DATA:  Fever and body aches EXAM: PORTABLE CHEST 1 VIEW COMPARISON:  10/09/2018 FINDINGS: Normal heart size and mediastinal contours. No acute infiltrate or edema. No effusion or pneumothorax. No acute osseous findings. IMPRESSION: Negative chest. Electronically Signed   By: 12/09/2018 M.D.   On: 11/03/2019 04:52    Radiology DG Chest Portable 1 View  Result Date: 11/03/2019 CLINICAL DATA:  Fever and body aches EXAM: PORTABLE CHEST 1 VIEW COMPARISON:  10/09/2018 FINDINGS: Normal heart size and mediastinal contours. No acute infiltrate or edema. No effusion or pneumothorax. No acute osseous findings. IMPRESSION: Negative chest. Electronically Signed   By: Marnee Spring M.D.   On: 11/03/2019 04:52    Procedures Procedures (including critical care time)  Medications Ordered in ED Medications  acetaminophen (TYLENOL) tablet 1,000 mg (1,000 mg Oral Given 11/03/19 0500)    ED Course  I have reviewed the triage vital signs and the nursing notes.  Pertinent labs & imaging results that were available during my care of the patient were reviewed by me and considered in my medical decision making (see chart for details).   No signs of PNA on CXR.  Well appearing no fever without antipyretics.  This is likely covid but the patient is well appearing exam and vitals are normal and reassuring.  There is no indication for admission at this time.  Outpatient covid swab sent and patient is placed in strict home isolation pending the results.    Erin Craig was evaluated in Emergency Department on 11/03/2019 for the symptoms described in the history of present illness. She was evaluated in the context of the global COVID-19 pandemic, which necessitated consideration that the patient might be at risk for infection with the SARS-CoV-2 virus that causes COVID-19. Institutional protocols and algorithms that pertain to the evaluation of  patients at risk for COVID-19 are in a state of rapid change based on information released by regulatory bodies including the CDC and federal and state organizations. These policies and algorithms were followed during the patient's care in the ED.  Final Clinical Impression(s) / ED Diagnoses Final diagnoses:  Person under investigation for COVID-19  Cough   Return for weakness, numbness, changes in vision or speech, fevers >100.4 unrelieved by medication, shortness of breath, intractable vomiting, or diarrhea, abdominal pain, Inability to tolerate liquids or food, cough, altered mental status or any concerns. No signs of systemic illness or infection. The patient is nontoxic-appearing on exam and vital signs are within normal limits.   I have reviewed the triage vital signs and the nursing notes. Pertinent labs &imaging results that were available during my care of the patient were reviewed by me and considered in my medical decision making (see chart for details).  After history, exam, and medical workup I feel the patient has been appropriately medically screened and is safe for discharge home. Pertinent diagnoses were discussed with the patient. Patient was givenstrictreturn precautions.   Shirl Weir, MD 11/03/19 [phone]

## 2020-01-22 ENCOUNTER — Emergency Department (HOSPITAL_BASED_OUTPATIENT_CLINIC_OR_DEPARTMENT_OTHER)
Admission: EM | Admit: 2020-01-22 | Discharge: 2020-01-22 | Disposition: A | Discharge status: Home / Self Care | Payer: 59 | Attending: Emergency Medicine | Admitting: Emergency Medicine

## 2020-01-22 ENCOUNTER — Encounter (HOSPITAL_BASED_OUTPATIENT_CLINIC_OR_DEPARTMENT_OTHER): Payer: Self-pay | Admitting: *Deleted

## 2020-01-22 ENCOUNTER — Other Ambulatory Visit: Payer: Self-pay

## 2020-01-22 DIAGNOSIS — J029 Acute pharyngitis, unspecified: Secondary | ICD-10-CM | POA: Insufficient documentation

## 2020-01-22 DIAGNOSIS — Z7984 Long term (current) use of oral hypoglycemic drugs: Secondary | ICD-10-CM | POA: Diagnosis not present

## 2020-01-22 DIAGNOSIS — Z79899 Other long term (current) drug therapy: Secondary | ICD-10-CM | POA: Insufficient documentation

## 2020-01-22 DIAGNOSIS — E119 Type 2 diabetes mellitus without complications: Secondary | ICD-10-CM | POA: Diagnosis not present

## 2020-01-22 DIAGNOSIS — R1319 Other dysphagia: Secondary | ICD-10-CM | POA: Diagnosis not present

## 2020-01-22 DIAGNOSIS — R1310 Dysphagia, unspecified: Secondary | ICD-10-CM

## 2020-01-22 DIAGNOSIS — I10 Essential (primary) hypertension: Secondary | ICD-10-CM | POA: Insufficient documentation

## 2020-01-22 DIAGNOSIS — R05 Cough: Secondary | ICD-10-CM | POA: Diagnosis not present

## 2020-01-22 DIAGNOSIS — R0982 Postnasal drip: Secondary | ICD-10-CM | POA: Insufficient documentation

## 2020-01-22 LAB — COMPREHENSIVE METABOLIC PANEL
ALT: 18 U/L (ref 0–44)
AST: 17 U/L (ref 15–41)
Albumin: 3.6 g/dL (ref 3.5–5.0)
Alkaline Phosphatase: 85 U/L (ref 38–126)
Anion gap: 8 (ref 5–15)
BUN: 14 mg/dL (ref 6–20)
CO2: 23 mmol/L (ref 22–32)
Calcium: 8.7 mg/dL — ABNORMAL LOW (ref 8.9–10.3)
Chloride: 107 mmol/L (ref 98–111)
Creatinine, Ser: 0.62 mg/dL (ref 0.44–1.00)
GFR calc Af Amer: >60 mL/min (ref >60)
GFR calc non Af Amer: >60 mL/min (ref >60)
Glucose, Bld: 137 mg/dL — ABNORMAL HIGH (ref 70–99)
Potassium: 3.7 mmol/L (ref 3.5–5.1)
Sodium: 138 mmol/L (ref 135–145)
Total Bilirubin: 0.5 mg/dL (ref 0.3–1.2)
Total Protein: 7 g/dL (ref 6.5–8.1)

## 2020-01-22 LAB — CBC WITH DIFFERENTIAL/PLATELET
Abs Immature Granulocytes: 0.02 10*3/uL (ref 0.00–0.07)
Basophils Absolute: 0 10*3/uL (ref 0.0–0.1)
Basophils Relative: 0 %
Eosinophils Absolute: 0.3 10*3/uL (ref 0.0–0.5)
Eosinophils Relative: 4 %
HCT: 40.1 % (ref 36.0–46.0)
Hemoglobin: 12.9 g/dL (ref 12.0–15.0)
Immature Granulocytes: 0 %
Lymphocytes Relative: 49 %
Lymphs Abs: 3.5 10*3/uL (ref 0.7–4.0)
MCH: 30.4 pg (ref 26.0–34.0)
MCHC: 32.2 g/dL (ref 30.0–36.0)
MCV: 94.4 fL (ref 80.0–100.0)
Monocytes Absolute: 0.5 10*3/uL (ref 0.1–1.0)
Monocytes Relative: 7 %
Neutro Abs: 2.9 10*3/uL (ref 1.7–7.7)
Neutrophils Relative %: 40 %
Platelets: 210 10*3/uL (ref 150–400)
RBC: 4.25 MIL/uL (ref 3.87–5.11)
RDW: 13.2 % (ref 11.5–15.5)
WBC: 7.1 10*3/uL (ref 4.0–10.5)
nRBC: 0 % (ref 0.0–0.2)

## 2020-01-22 LAB — MONONUCLEOSIS SCREEN: Mono Screen: NEGATIVE

## 2020-01-22 MED ORDER — LIDOCAINE VISCOUS HCL 2 % MT SOLN
15.0000 mL | Freq: Once | OROMUCOSAL | Status: AC
  Administered 2020-01-22: 15 mL via ORAL
  Filled 2020-01-22: qty 15

## 2020-01-22 MED ORDER — NYSTATIN 100000 UNIT/ML MT SUSP: 500000.0000 [IU] | Freq: Four times a day (QID) | OROMUCOSAL | 0 refills | Status: DC

## 2020-01-22 MED ORDER — ALUM & MAG HYDROXIDE-SIMETH 200-200-20 MG/5ML PO SUSP
30.0000 mL | Freq: Once | ORAL | Status: AC
  Administered 2020-01-22: 30 mL via ORAL
  Filled 2020-01-22: qty 30

## 2020-01-22 NOTE — ED Provider Notes (Signed)
[facility] EMERGENCY DEPARTMENT Provider Note   CSN: [id number] Arrival date & time: 01/22/20  0232     History Chief Complaint  Patient presents with  . Sore Throat    Erin Craig is a 60 y.o. female.  Patient with history of diabetes and hypertension presenting with throat discomfort for "months". States she has had a sore throat ongoing since at least February or March after she was treated for a sinus infection. Reports the pain is constant worse with swallowing. However she has no difficulty swallowing liquids or food. No vomiting or regurgitation. No fever, chills, nausea or vomiting. No difficulty breathing or difficulty swallowing. She has been treating at home with ibuprofen. She has had several televisits and was prescribed a PPI as well as Allegra for possible allergic component. She has been tested negative for Covid and strep. States she has a dry cough and hoarse voice. She comes in today because her throat is hurting worse and she is frustrated. States her sugars have been well controlled.  The history is provided by the patient.  Sore Throat Pertinent negatives include no chest pain, no abdominal pain, no headaches and no shortness of breath.       Past Medical History:  Diagnosis Date  . Bradycardia   . Diabetes mellitus without complication ([facility])   . Heart murmur   . Hypercholesterolemia   . Hypertension     There are no problems to display for this patient.   Past Surgical History:  Procedure Laterality Date  . ABDOMINAL HYSTERECTOMY    . CESAREAN SECTION    . CHOLECYSTECTOMY    . FOOT SURGERY       OB History   No obstetric history on file.     Family History  Problem Relation Age of Onset  . Alzheimer's disease Other   . Coronary artery disease Other   . Breast cancer Other   . Arthritis Other   . Hypertension Other   . Diabetes Other     Social History   Tobacco Use  . Smoking status: Never Smoker  . Smokeless tobacco:  Never Used  Vaping Use  . Vaping Use: Never used  Substance Use Topics  . Alcohol use: No  . Drug use: No    Home Medications Prior to Admission medications   Medication Sig Start Date End Date Taking? Authorizing Provider  amoxicillin-clavulanate (AUGMENTIN) 875-125 MG tablet Take 1 tablet by mouth 2 (two) times daily. One po bid x 7 days 10/19/19   Pricilla Loveless, MD  cyanocobalamin (,VITAMIN B-12,) 1000 MCG/ML injection INJECT IM EVERY MONTHLY 04/07/16   [provider]  metFORMIN (GLUCOPHAGE) 500 MG tablet Take by mouth. 09/29/16   [provider]  atorvastatin (LIPITOR) 10 MG tablet Take 10 mg by mouth daily.  07/14/19  [provider]  furosemide (LASIX) 20 MG tablet Take 1 tablet (20 mg total) by mouth daily. 01/09/18 07/14/19  Bethel Born, PA-C  glipiZIDE (GLUCOTROL) 5 MG tablet Take 10 mg by mouth daily before breakfast.  07/14/19  [provider]  lisinopril (PRINIVIL,ZESTRIL) 5 MG tablet Take 5 mg by mouth daily.  07/14/19  [provider]    Allergies    Patient has no known allergies.  Review of Systems   Review of Systems  Constitutional: Negative for activity change, appetite change, fatigue and fever.  HENT: Positive for congestion, postnasal drip, sore throat and trouble swallowing. Negative for dental problem, facial swelling and rhinorrhea.  Eyes: Negative for visual disturbance.  Respiratory: Negative for cough, chest tightness and shortness of breath.   Cardiovascular: Negative for chest pain.  Gastrointestinal: Negative for abdominal pain, nausea and vomiting.  Genitourinary: Negative for dysuria and hematuria.  Musculoskeletal: Negative for arthralgias and myalgias.  Skin: Negative for rash.  Neurological: Negative for weakness and headaches.   all other systems are negative except as noted in the HPI and PMH.    Physical Exam Updated Vital Signs BP 128/74   Pulse (!) 50   Temp 98.2 F (36.8 C)   Resp  18   Ht 5\' 4"  (1.626 m)   Wt 79.4 kg   LMP  (LMP Unknown)   SpO2 100%   BMI 30.04 kg/m   Physical Exam Vitals and nursing note reviewed.  Constitutional:      General: She is not in acute distress.    Appearance: Normal appearance. She is well-developed and normal weight. She is not ill-appearing.     Comments: No distress. S speaking full sentences  HENT:     Head: Normocephalic and atraumatic.     Mouth/Throat:     Mouth: Mucous membranes are moist.     Pharynx: No oropharyngeal exudate or posterior oropharyngeal erythema.     Comments: Oropharynx appears normal without asymmetry or exudate. Uvula is midline. No thrush Eyes:     Conjunctiva/sclera: Conjunctivae normal.     Pupils: Pupils are equal, round, and reactive to light.  Neck:     Comments: No meningismus. Cardiovascular:     Rate and Rhythm: Normal rate and regular rhythm.     Heart sounds: Normal heart sounds. No murmur heard.   Pulmonary:     Effort: Pulmonary effort is normal. No respiratory distress.     Breath sounds: Normal breath sounds.  Abdominal:     Palpations: Abdomen is soft.     Tenderness: There is no abdominal tenderness. There is no guarding or rebound.  Musculoskeletal:        General: No tenderness. Normal range of motion.     Cervical back: Normal range of motion and neck supple.  Skin:    General: Skin is warm.     Capillary Refill: Capillary refill takes less than 2 seconds.  Neurological:     General: No focal deficit present.     Mental Status: She is alert and oriented to person, place, and time. Mental status is at baseline.     Cranial Nerves: No cranial nerve deficit.     Motor: No abnormal muscle tone.     Coordination: Coordination normal.     Comments:  5/5 strength throughout. CN 2-12 intact.Equal grip strength.   Psychiatric:        Behavior: Behavior normal.     ED Results / Procedures / Treatments   Labs (all labs ordered are listed, but only abnormal results are  displayed) Labs Reviewed  COMPREHENSIVE METABOLIC PANEL - Abnormal; Notable for the following components:      Result Value   Glucose, Bld 137 (*)    Calcium 8.7 (*)    All other components within normal limits  CBC WITH DIFFERENTIAL/PLATELET  MONONUCLEOSIS SCREEN    EKG None  Radiology No results found.  Procedures Procedures (including critical care time)  Medications Ordered in ED Medications - No data to display  ED Course  I have reviewed the triage vital signs and the nursing notes.  Pertinent labs & imaging results that were available during my care of  the patient were reviewed by me and considered in my medical decision making (see chart for details).    MDM Rules/Calculators/A&P                         Diabetic with many months of sore throat and odynophagia. There is no evidence of thrush.  Has not responded to PPIs and antihistamines.  Labs reassuring not significantly hyperglycemic.  Mono screen is negative. Patient able to tolerate p.o. and swallow without difficulty.  We will treat empirically for possible candidiasis component of her odynophagia. She has seen Dr. Benson Norway in the past and will call him in the morning.  May benefit from EGD. Also refer to ENT for further evaluation.  Return to the ED with difficulty breathing, difficulty swallowing, any other concerns. Final Clinical Impression(s) / ED Diagnoses Final diagnoses:  Sore throat  Odynophagia    Rx / DC Orders ED Discharge Orders    None       Preslyn Warr, Annie Main, MD 01/22/20 [phone]

## 2020-01-22 NOTE — Discharge Instructions (Signed)
Take the medicine as prescribed for possible yeast infection in your throat.  Follow-up with Dr. Elnoria Howard for a possible EGD as well as Dr. Annalee Genta who is an ear nose and throat doctor for further evaluation. Return to the ED with difficulty breathing, difficulty swallowing, any other concerns

## 2020-01-22 NOTE — ED Notes (Signed)
Pt declined Group A Strep since she had one done a few days ago by her PCP.

## 2020-01-22 NOTE — ED Triage Notes (Addendum)
C/o sore throat for "months" has been treated with reflux meds which pt states did not help. Has also been treated with ibuprofen. Last dose 8pm of ibuprofen. Has been tested for strep and covid both of which were negative. States she has had a dry cough and hoarse voice. Denies fevers.

## 2020-10-19 ENCOUNTER — Other Ambulatory Visit: Payer: Self-pay

## 2020-10-19 ENCOUNTER — Emergency Department (HOSPITAL_BASED_OUTPATIENT_CLINIC_OR_DEPARTMENT_OTHER)
Admission: EM | Admit: 2020-10-19 | Discharge: 2020-10-20 | Disposition: A | Discharge status: Home / Self Care | Payer: 59 | Attending: Emergency Medicine | Admitting: Emergency Medicine

## 2020-10-19 ENCOUNTER — Encounter (HOSPITAL_BASED_OUTPATIENT_CLINIC_OR_DEPARTMENT_OTHER): Payer: Self-pay | Admitting: *Deleted

## 2020-10-19 DIAGNOSIS — E119 Type 2 diabetes mellitus without complications: Secondary | ICD-10-CM | POA: Diagnosis not present

## 2020-10-19 DIAGNOSIS — Z79899 Other long term (current) drug therapy: Secondary | ICD-10-CM | POA: Insufficient documentation

## 2020-10-19 DIAGNOSIS — R197 Diarrhea, unspecified: Secondary | ICD-10-CM | POA: Diagnosis present

## 2020-10-19 DIAGNOSIS — R109 Unspecified abdominal pain: Secondary | ICD-10-CM | POA: Insufficient documentation

## 2020-10-19 DIAGNOSIS — R112 Nausea with vomiting, unspecified: Secondary | ICD-10-CM

## 2020-10-19 DIAGNOSIS — Z7984 Long term (current) use of oral hypoglycemic drugs: Secondary | ICD-10-CM | POA: Insufficient documentation

## 2020-10-19 DIAGNOSIS — I10 Essential (primary) hypertension: Secondary | ICD-10-CM | POA: Diagnosis not present

## 2020-10-19 LAB — CBC WITH DIFFERENTIAL/PLATELET
Abs Immature Granulocytes: 0.03 10*3/uL (ref 0.00–0.07)
Basophils Absolute: 0 10*3/uL (ref 0.0–0.1)
Basophils Relative: 1 %
Eosinophils Absolute: 0.1 10*3/uL (ref 0.0–0.5)
Eosinophils Relative: 2 %
HCT: 42.4 % (ref 36.0–46.0)
Hemoglobin: 13.9 g/dL (ref 12.0–15.0)
Immature Granulocytes: 1 %
Lymphocytes Relative: 40 %
Lymphs Abs: 1.5 10*3/uL (ref 0.7–4.0)
MCH: 30.5 pg (ref 26.0–34.0)
MCHC: 32.8 g/dL (ref 30.0–36.0)
MCV: 93 fL (ref 80.0–100.0)
Monocytes Absolute: 0.5 10*3/uL (ref 0.1–1.0)
Monocytes Relative: 15 %
Neutro Abs: 1.5 10*3/uL — ABNORMAL LOW (ref 1.7–7.7)
Neutrophils Relative %: 41 %
Platelets: 188 10*3/uL (ref 150–400)
RBC: 4.56 MIL/uL (ref 3.87–5.11)
RDW: 12.7 % (ref 11.5–15.5)
WBC: 3.6 10*3/uL — ABNORMAL LOW (ref 4.0–10.5)
nRBC: 0 % (ref 0.0–0.2)

## 2020-10-19 LAB — COMPREHENSIVE METABOLIC PANEL
ALT: 20 U/L (ref 0–44)
AST: 26 U/L (ref 15–41)
Albumin: 3.9 g/dL (ref 3.5–5.0)
Alkaline Phosphatase: 66 U/L (ref 38–126)
Anion gap: 11 (ref 5–15)
BUN: 17 mg/dL (ref 6–20)
CO2: 21 mmol/L — ABNORMAL LOW (ref 22–32)
Calcium: 8.8 mg/dL — ABNORMAL LOW (ref 8.9–10.3)
Chloride: 106 mmol/L (ref 98–111)
Creatinine, Ser: 0.66 mg/dL (ref 0.44–1.00)
GFR, Estimated: >60 mL/min (ref >60)
Glucose, Bld: 104 mg/dL — ABNORMAL HIGH (ref 70–99)
Potassium: 3.8 mmol/L (ref 3.5–5.1)
Sodium: 138 mmol/L (ref 135–145)
Total Bilirubin: 0.7 mg/dL (ref 0.3–1.2)
Total Protein: 7.2 g/dL (ref 6.5–8.1)

## 2020-10-19 LAB — URINALYSIS, MICROSCOPIC (REFLEX)

## 2020-10-19 LAB — URINALYSIS, ROUTINE W REFLEX MICROSCOPIC
Bilirubin Urine: NEGATIVE
Glucose, UA: NEGATIVE mg/dL
Ketones, ur: 15 mg/dL — AB
Leukocytes,Ua: NEGATIVE
Nitrite: NEGATIVE
Protein, ur: NEGATIVE mg/dL
Specific Gravity, Urine: >1.03 — ABNORMAL HIGH (ref 1.005–1.030)
pH: 5.5 (ref 5.0–8.0)

## 2020-10-19 LAB — LIPASE, BLOOD: Lipase: 38 U/L (ref 11–51)

## 2020-10-19 MED ORDER — ONDANSETRON 4 MG PO TBDP: ORAL_TABLET | ORAL | 0 refills | Status: DC

## 2020-10-19 MED ORDER — SODIUM CHLORIDE 0.9 % IV BOLUS
1000.0000 mL | Freq: Once | INTRAVENOUS | Status: AC
  Administered 2020-10-19: 1000 mL via INTRAVENOUS

## 2020-10-19 MED ORDER — ONDANSETRON HCL 4 MG/2ML IJ SOLN
4.0000 mg | Freq: Once | INTRAMUSCULAR | Status: AC
  Administered 2020-10-19: 4 mg via INTRAVENOUS
  Filled 2020-10-19: qty 2

## 2020-10-19 NOTE — ED Triage Notes (Addendum)
Pt reports diarrhea and abdominal pain since Thursday, worse after eating

## 2020-10-19 NOTE — ED Provider Notes (Signed)
[facility] EMERGENCY DEPARTMENT Provider Note   CSN: [id number] Arrival date & time: 10/19/20  2003     History Chief Complaint  Patient presents with  . Diarrhea    Erin Craig is a 60 y.o. female.  The history is provided by the patient and medical records. No language interpreter was used.  Diarrhea Quality:  Watery Severity:  Severe Onset quality:  Gradual Number of episodes:  Numerous Duration:  3 days Timing:  Constant Progression:  Unchanged Relieved by:  Nothing Worsened by:  Nothing Ineffective treatments:  None tried Associated symptoms: abdominal pain and vomiting   Associated symptoms: no chills, no recent cough, no diaphoresis, no fever, no headaches, no myalgias and no URI        Past Medical History:  Diagnosis Date  . Bradycardia   . Diabetes mellitus without complication (HCC)   . Heart murmur   . Hypercholesterolemia   . Hypertension     There are no problems to display for this patient.   Past Surgical History:  Procedure Laterality Date  . ABDOMINAL HYSTERECTOMY    . CESAREAN SECTION    . CHOLECYSTECTOMY    . FOOT SURGERY       OB History   No obstetric history on file.     Family History  Problem Relation Age of Onset  . Alzheimer's disease Other   . Coronary artery disease Other   . Breast cancer Other   . Arthritis Other   . Hypertension Other   . Diabetes Other     Social History   Tobacco Use  . Smoking status: Never Smoker  . Smokeless tobacco: Never Used  Vaping Use  . Vaping Use: Never used  Substance Use Topics  . Alcohol use: No  . Drug use: No    Home Medications Prior to Admission medications   Medication Sig Start Date End Date Taking? Authorizing Provider  amoxicillin-clavulanate (AUGMENTIN) 875-125 MG tablet Take 1 tablet by mouth 2 (two) times daily. One po bid x 7 days 10/19/19   Pricilla Loveless, MD  cyanocobalamin (,VITAMIN B-12,) 1000 MCG/ML injection INJECT IM EVERY MONTHLY  04/07/16   [provider]  metFORMIN (GLUCOPHAGE) 500 MG tablet Take by mouth. 09/29/16   [provider]  nystatin (MYCOSTATIN) 100000 UNIT/ML suspension Take 5 mLs (500,000 Units total) by mouth 4 (four) times daily. 01/22/20   Rancour, Jeannett Senior, MD  atorvastatin (LIPITOR) 10 MG tablet Take 10 mg by mouth daily.  07/14/19  [provider]  furosemide (LASIX) 20 MG tablet Take 1 tablet (20 mg total) by mouth daily. 01/09/18 07/14/19  Bethel Born, PA-C  glipiZIDE (GLUCOTROL) 5 MG tablet Take 10 mg by mouth daily before breakfast.  07/14/19  [provider]  lisinopril (PRINIVIL,ZESTRIL) 5 MG tablet Take 5 mg by mouth daily.  07/14/19  [provider]    Allergies    Patient has no known allergies.  Review of Systems   Review of Systems  Constitutional: Negative for chills, diaphoresis, fatigue and fever.  HENT: Negative for congestion.   Eyes: Negative for visual disturbance.  Respiratory: Negative for cough, chest tightness and wheezing.   Cardiovascular: Negative for chest pain and palpitations.  Gastrointestinal: Positive for abdominal pain, diarrhea and vomiting.  Genitourinary: Negative for dysuria and frequency.  Musculoskeletal: Negative for back pain, myalgias and neck pain.  Skin: Negative for wound.  Neurological: Negative for dizziness, weakness, light-headedness, numbness and headaches.  Psychiatric/Behavioral: Negative for agitation and confusion.  All other systems reviewed and are negative.   Physical Exam Updated Vital Signs BP 111/81   Pulse 65   Temp 98.3 F (36.8 C) (Oral)   Resp 18   Ht 5\' 4"  (1.626 m)   Wt 83.9 kg   LMP  (LMP Unknown)   SpO2 98%   BMI 31.76 kg/m   Physical Exam Vitals and nursing note reviewed.  Constitutional:      General: She is not in acute distress.    Appearance: She is well-developed. She is not ill-appearing, toxic-appearing or diaphoretic.  HENT:     Head: Normocephalic and  atraumatic.     Nose: No congestion or rhinorrhea.     Mouth/Throat:     Mouth: Mucous membranes are dry.     Pharynx: No oropharyngeal exudate or posterior oropharyngeal erythema.  Eyes:     Extraocular Movements: Extraocular movements intact.     Conjunctiva/sclera: Conjunctivae normal.     Pupils: Pupils are equal, round, and reactive to light.  Cardiovascular:     Rate and Rhythm: Normal rate and regular rhythm.     Heart sounds: No murmur heard.   Pulmonary:     Effort: Pulmonary effort is normal. No respiratory distress.     Breath sounds: Normal breath sounds. No wheezing, rhonchi or rales.  Chest:     Chest wall: No tenderness.  Abdominal:     General: Abdomen is flat.     Palpations: Abdomen is soft.     Tenderness: There is no abdominal tenderness. There is no right CVA tenderness, left CVA tenderness, guarding or rebound.  Musculoskeletal:        General: No tenderness.     Cervical back: Neck supple.     Right lower leg: No edema.     Left lower leg: No edema.  Skin:    General: Skin is warm and dry.     Capillary Refill: Capillary refill takes less than 2 seconds.     Findings: No erythema.  Neurological:     General: No focal deficit present.     Mental Status: She is alert.  Psychiatric:        Mood and Affect: Mood normal.     ED Results / Procedures / Treatments   Labs (all labs ordered are listed, but only abnormal results are displayed) Labs Reviewed  CBC WITH DIFFERENTIAL/PLATELET - Abnormal; Notable for the following components:      Result Value   WBC 3.6 (*)    Neutro Abs 1.5 (*)    All other components within normal limits  COMPREHENSIVE METABOLIC PANEL - Abnormal; Notable for the following components:   CO2 21 (*)    Glucose, Bld 104 (*)    Calcium 8.8 (*)    All other components within normal limits  URINE CULTURE  LIPASE, BLOOD  URINALYSIS, ROUTINE W REFLEX MICROSCOPIC    EKG None  Radiology No results  found.  Procedures Procedures   Medications Ordered in ED Medications  sodium chloride 0.9 % bolus 1,000 mL (1,000 mLs Intravenous New Bag/Given 10/19/20 2136)  ondansetron (ZOFRAN) injection 4 mg (4 mg Intravenous Given 10/19/20 2134)    ED Course  I have reviewed the triage vital signs and the nursing notes.  Pertinent labs & imaging results that were available during my care of the patient were reviewed by me and considered in my medical decision making (see chart for details).    MDM Rules/Calculators/A&P  Hendel Gatliff is a 60 y.o. female with a past medical history significant for hypertension, hypercholesterolemia, diabetes, prior abdominal hysterectomy and prior cholecystectomy who presents with nausea, vomiting, diarrhea, and some abdominal discomfort for the last few days.  She reports that since Thursday, she did have the symptoms with watery diarrhea.  She denies any constipation or blood in her stool.  She denies any trauma.  She denies any fevers, chills, chest, shortness of breath, palpitations, or syncope.  She reports she does not have anybody else that is sick.  She denies any concerning foods that she ingested before onset.     On exam, lungs clear and chest nontender.  Back nontender.  Flanks nontender.  No CVA tenderness.  Abdomen was not specifically tender but she had some soreness diffusely.  Bowel sounds were appreciated.  Exam otherwise unremarkable.  She is not tachycardic or hypotensive.  She is not febrile.  Clinically given the ongoing gastroenteritis circulating through the community currently, I suspect that is the cause of her nausea vomiting, diarrhea.  I suspect this is causing some abdominal discomfort as well.  I had a shared decision-making conversation with patient and we agreed to get labs, urinalysis, give her nausea medicine and fluids initially.  If her symptoms are persistent or worsen, we will likely go to getting a CT scan to  evaluate for diverticulitis or other acute problems.  Patient is agreement with this plan and agrees to hold on imaging initially.  Anticipate reassessment after work-up to determine disposition.   Care transferred to oncoming team awaiting for results of urinalysis and reassessment.  Other labs are reassuring thus far.  Anticipate discharge with likely prescription for nausea medication and PCP follow-up if work-up is not concerning.    Final Clinical Impression(s) / ED Diagnoses Final diagnoses:  Nausea vomiting and diarrhea     Clinical Impression: 1. Nausea vomiting and diarrhea     Disposition: Discharge  Condition: Good  I have discussed the results, Dx and Tx plan with the pt(& family if present). He/she/they expressed understanding and agree(s) with the plan. Discharge instructions discussed at great length. Strict return precautions discussed and pt &/or family have verbalized understanding of the instructions. No further questions at time of discharge.    New Prescriptions   No medications on file    Follow Up: No follow-up provider specified.     Tegeler, Canary Brim, MD 10/19/20 [phone]

## 2020-10-19 NOTE — Discharge Instructions (Addendum)
Take the nausea medicine as needed for nausea.  You can take Imodium for diarrhea.  Please return for an inability to eat or drink if you have worsening abdominal pain.  Please follow-up with your family doctor.

## 2020-10-20 NOTE — ED Provider Notes (Signed)
60 yo M I received in signout from Dr. Rush Landmark.  Briefly she is a 60 year old female with nausea vomiting and diarrhea.  Some diffuse abdominal pain initially on exam.  Plan for IV fluids nausea medicine and reassessment.  Patient is resting comfortably on reassessment.  No significant abdominal tenderness currently.  Lab work without significant LFT or lipase elevation.  Mild leukopenia.  Mildly concentrated urine without infection.  Will discharge the patient home.  PCP follow-up.   Melene Plan, DO 10/20/20 0001

## 2020-10-21 LAB — URINE CULTURE: Culture: <10000 — AB

## 2020-10-22 ENCOUNTER — Emergency Department (HOSPITAL_BASED_OUTPATIENT_CLINIC_OR_DEPARTMENT_OTHER)
Admission: EM | Admit: 2020-10-22 | Discharge: 2020-10-22 | Disposition: A | Discharge status: Home / Self Care | Payer: 59 | Attending: Emergency Medicine | Admitting: Emergency Medicine

## 2020-10-22 ENCOUNTER — Encounter (HOSPITAL_BASED_OUTPATIENT_CLINIC_OR_DEPARTMENT_OTHER): Payer: Self-pay | Admitting: Emergency Medicine

## 2020-10-22 ENCOUNTER — Emergency Department (HOSPITAL_BASED_OUTPATIENT_CLINIC_OR_DEPARTMENT_OTHER): Payer: 59

## 2020-10-22 ENCOUNTER — Other Ambulatory Visit: Payer: Self-pay

## 2020-10-22 DIAGNOSIS — R1013 Epigastric pain: Secondary | ICD-10-CM | POA: Insufficient documentation

## 2020-10-22 DIAGNOSIS — R0789 Other chest pain: Secondary | ICD-10-CM | POA: Insufficient documentation

## 2020-10-22 DIAGNOSIS — R197 Diarrhea, unspecified: Secondary | ICD-10-CM | POA: Diagnosis not present

## 2020-10-22 DIAGNOSIS — E876 Hypokalemia: Secondary | ICD-10-CM | POA: Diagnosis not present

## 2020-10-22 DIAGNOSIS — Z79899 Other long term (current) drug therapy: Secondary | ICD-10-CM | POA: Diagnosis not present

## 2020-10-22 DIAGNOSIS — I10 Essential (primary) hypertension: Secondary | ICD-10-CM | POA: Diagnosis not present

## 2020-10-22 DIAGNOSIS — R079 Chest pain, unspecified: Secondary | ICD-10-CM | POA: Diagnosis present

## 2020-10-22 DIAGNOSIS — R630 Anorexia: Secondary | ICD-10-CM | POA: Insufficient documentation

## 2020-10-22 DIAGNOSIS — E119 Type 2 diabetes mellitus without complications: Secondary | ICD-10-CM | POA: Diagnosis not present

## 2020-10-22 DIAGNOSIS — R059 Cough, unspecified: Secondary | ICD-10-CM | POA: Diagnosis not present

## 2020-10-22 DIAGNOSIS — Z7984 Long term (current) use of oral hypoglycemic drugs: Secondary | ICD-10-CM | POA: Diagnosis not present

## 2020-10-22 LAB — CBC WITH DIFFERENTIAL/PLATELET
Abs Immature Granulocytes: 0.01 10*3/uL (ref 0.00–0.07)
Basophils Absolute: 0 10*3/uL (ref 0.0–0.1)
Basophils Relative: 1 %
Eosinophils Absolute: 0.1 10*3/uL (ref 0.0–0.5)
Eosinophils Relative: 2 %
HCT: 40.1 % (ref 36.0–46.0)
Hemoglobin: 13.3 g/dL (ref 12.0–15.0)
Immature Granulocytes: 0 %
Lymphocytes Relative: 56 %
Lymphs Abs: 2.4 10*3/uL (ref 0.7–4.0)
MCH: 30.7 pg (ref 26.0–34.0)
MCHC: 33.2 g/dL (ref 30.0–36.0)
MCV: 92.6 fL (ref 80.0–100.0)
Monocytes Absolute: 0.5 10*3/uL (ref 0.1–1.0)
Monocytes Relative: 12 %
Neutro Abs: 1.2 10*3/uL — ABNORMAL LOW (ref 1.7–7.7)
Neutrophils Relative %: 29 %
Platelets: 201 10*3/uL (ref 150–400)
RBC: 4.33 MIL/uL (ref 3.87–5.11)
RDW: 12.5 % (ref 11.5–15.5)
WBC: 4.3 10*3/uL (ref 4.0–10.5)
nRBC: 0 % (ref 0.0–0.2)

## 2020-10-22 LAB — TROPONIN I (HIGH SENSITIVITY)
Troponin I (High Sensitivity): 4 ng/L (ref <18)
Troponin I (High Sensitivity): 5 ng/L (ref <18)
Troponin I (High Sensitivity): 4 ng/L (ref <18)

## 2020-10-22 LAB — COMPREHENSIVE METABOLIC PANEL
ALT: 22 U/L (ref 0–44)
AST: 31 U/L (ref 15–41)
Albumin: 3.1 g/dL — ABNORMAL LOW (ref 3.5–5.0)
Alkaline Phosphatase: 54 U/L (ref 38–126)
Anion gap: 8 (ref 5–15)
BUN: 10 mg/dL (ref 6–20)
CO2: 22 mmol/L (ref 22–32)
Calcium: 7.7 mg/dL — ABNORMAL LOW (ref 8.9–10.3)
Chloride: 108 mmol/L (ref 98–111)
Creatinine, Ser: 0.57 mg/dL (ref 0.44–1.00)
GFR, Estimated: >60 mL/min (ref >60)
Glucose, Bld: 101 mg/dL — ABNORMAL HIGH (ref 70–99)
Potassium: 3.1 mmol/L — ABNORMAL LOW (ref 3.5–5.1)
Sodium: 138 mmol/L (ref 135–145)
Total Bilirubin: 0.3 mg/dL (ref 0.3–1.2)
Total Protein: 6 g/dL — ABNORMAL LOW (ref 6.5–8.1)

## 2020-10-22 LAB — LIPASE, BLOOD: Lipase: 42 U/L (ref 11–51)

## 2020-10-22 MED ORDER — POTASSIUM CHLORIDE CRYS ER 20 MEQ PO TBCR: 20.0000 meq | EXTENDED_RELEASE_TABLET | Freq: Every day | ORAL | 0 refills | Status: DC

## 2020-10-22 MED ORDER — SODIUM CHLORIDE 0.9 % IV BOLUS
1000.0000 mL | Freq: Once | INTRAVENOUS | Status: AC
  Administered 2020-10-22: 1000 mL via INTRAVENOUS

## 2020-10-22 MED ORDER — POTASSIUM CHLORIDE CRYS ER 20 MEQ PO TBCR
40.0000 meq | EXTENDED_RELEASE_TABLET | Freq: Once | ORAL | Status: AC
  Administered 2020-10-22: 40 meq via ORAL
  Filled 2020-10-22: qty 2

## 2020-10-22 NOTE — ED Triage Notes (Signed)
Pt reports awaking with left sided chest and arm pain. Pt was seen here 2 days ago for diarrhea and cough which are still present

## 2020-10-22 NOTE — ED Provider Notes (Signed)
[facility] Provider Note: Erin Dell, MD, FACEP  CSN: [id number] MRN: [id number] ARRIVAL: 10/22/20 at 0053 ROOM: [facility]   CHIEF COMPLAINT  Chest Pain   HISTORY OF PRESENT ILLNESS  10/22/20 1:12 AM Erin Craig is a 60 y.o. female who has had several days of diarrhea, epigastric pain, loss of appetite and cough.  She is here now because she developed left upper chest pain radiating to her left arm that began while she was sleeping about 30 minutes prior to arrival.  She rated it initially as a 9 out of 10 but it has improved.  The pain is now only present in her left forearm.  It is not worse with deep breathing or movement of the left shoulder or arm.  She has had no associated shortness of breath or nausea.   Past Medical History:  Diagnosis Date  . Bradycardia   . Diabetes mellitus without complication (HCC)   . Heart murmur   . Hypercholesterolemia   . Hypertension     Past Surgical History:  Procedure Laterality Date  . ABDOMINAL HYSTERECTOMY    . CESAREAN SECTION    . CHOLECYSTECTOMY    . FOOT SURGERY      Family History  Problem Relation Age of Onset  . Alzheimer's disease Other   . Coronary artery disease Other   . Breast cancer Other   . Arthritis Other   . Hypertension Other   . Diabetes Other     Social History   Tobacco Use  . Smoking status: Never Smoker  . Smokeless tobacco: Never Used  Vaping Use  . Vaping Use: Never used  Substance Use Topics  . Alcohol use: No  . Drug use: No    Prior to Admission medications   Medication Sig Start Date End Date Taking? Authorizing Provider  amoxicillin-clavulanate (AUGMENTIN) 875-125 MG tablet Take 1 tablet by mouth 2 (two) times daily. One po bid x 7 days 10/19/19   Pricilla Loveless, MD  cyanocobalamin (,VITAMIN B-12,) 1000 MCG/ML injection INJECT IM EVERY MONTHLY 04/07/16   [provider]  metFORMIN (GLUCOPHAGE) 500 MG tablet Take by mouth. 09/29/16   [provider]  nystatin (MYCOSTATIN) 100000 UNIT/ML suspension Take 5 mLs (500,000 Units total) by mouth 4 (four) times daily. 01/22/20   Rancour, Jeannett Senior, MD  ondansetron (ZOFRAN ODT) 4 MG disintegrating tablet 4mg  ODT q4 hours prn nausea/vomit 10/19/20   12/19/20, DO  atorvastatin (LIPITOR) 10 MG tablet Take 10 mg by mouth daily.  07/14/19  [provider]  furosemide (LASIX) 20 MG tablet Take 1 tablet (20 mg total) by mouth daily. 01/09/18 07/14/19  14/4/20, PA-C  glipiZIDE (GLUCOTROL) 5 MG tablet Take 10 mg by mouth daily before breakfast.  07/14/19  [provider]  lisinopril (PRINIVIL,ZESTRIL) 5 MG tablet Take 5 mg by mouth daily.  07/14/19  [provider]    Allergies Patient has no known allergies.   REVIEW OF SYSTEMS  Negative except as noted here or in the History of Present Illness.   PHYSICAL EXAMINATION  Initial Vital Signs Blood pressure 138/83, pulse (!) 55, temperature 98.4 F (36.9 C), temperature source Oral, height 5\' 4"  (1.626 m), weight 85.7 kg, SpO2 100 %.  Examination General: Well-developed, well-nourished female in no acute distress; appearance consistent with age of record HENT: normocephalic; atraumatic Eyes: pupils equal, round and reactive to light; extraocular muscles intact Neck: supple Heart: regular rate and rhythm; bradycardia Lungs: clear to auscultation  bilaterally Chest: Nontender; no pain on movement of left shoulder or palpation of left arm Abdomen: soft; nondistended; epigastric tenderness; bowel sounds present Extremities: No deformity; full range of motion; pulses normal Neurologic: Awake, alert and oriented; motor function intact in all extremities and symmetric; no facial droop Skin: Warm and dry Psychiatric: Flat affect   RESULTS  Summary of this visit's results, reviewed and interpreted by myself:   EKG Interpretation  Date/Time:  Tuesday October 22 2020 01:15:35 EDT Ventricular Rate:  53 PR Interval:     QRS Duration: 87 QT Interval:  442 QTC Calculation: 415 R Axis:   65 Text Interpretation: Sinus bradycardia Borderline T abnormalities, anterior leads Rate is slower Confirmed by Paula Libra ([id number]) on 10/22/2020 1:22:09 AM      Laboratory Studies: Results for orders placed or performed during the hospital encounter of 10/22/20 (from the past 24 hour(s))  CBC with Differential/Platelet     Status: Abnormal   Collection Time: 10/22/20  1:44 AM  Result Value Ref Range   WBC 4.3 4.0 - 10.5 K/uL   RBC 4.33 3.87 - 5.11 MIL/uL   Hemoglobin 13.3 12.0 - 15.0 g/dL   HCT 41.2 87.8 - 67.6 %   MCV 92.6 80.0 - 100.0 fL   MCH 30.7 26.0 - 34.0 pg   MCHC 33.2 30.0 - 36.0 g/dL   RDW 72.0 94.7 - 09.6 %   Platelets 201 150 - 400 K/uL   nRBC 0.0 0.0 - 0.2 %   Neutrophils Relative % 29 %   Neutro Abs 1.2 (L) 1.7 - 7.7 K/uL   Lymphocytes Relative 56 %   Lymphs Abs 2.4 0.7 - 4.0 K/uL   Monocytes Relative 12 %   Monocytes Absolute 0.5 0.1 - 1.0 K/uL   Eosinophils Relative 2 %   Eosinophils Absolute 0.1 0.0 - 0.5 K/uL   Basophils Relative 1 %   Basophils Absolute 0.0 0.0 - 0.1 K/uL   Immature Granulocytes 0 %   Abs Immature Granulocytes 0.01 0.00 - 0.07 K/uL  Troponin I (High Sensitivity)     Status: None   Collection Time: 10/22/20  1:44 AM  Result Value Ref Range   Troponin I (High Sensitivity) 4 <18 ng/L  Comprehensive metabolic panel     Status: Abnormal   Collection Time: 10/22/20  2:15 AM  Result Value Ref Range   Sodium 138 135 - 145 mmol/L   Potassium 3.1 (L) 3.5 - 5.1 mmol/L   Chloride 108 98 - 111 mmol/L   CO2 22 22 - 32 mmol/L   Glucose, Bld 101 (H) 70 - 99 mg/dL   BUN 10 6 - 20 mg/dL   Creatinine, Ser 2.83 0.44 - 1.00 mg/dL   Calcium 7.7 (L) 8.9 - 10.3 mg/dL   Total Protein 6.0 (L) 6.5 - 8.1 g/dL   Albumin 3.1 (L) 3.5 - 5.0 g/dL   AST 31 15 - 41 U/L   ALT 22 0 - 44 U/L   Alkaline Phosphatase 54 38 - 126 U/L   Total Bilirubin 0.3 0.3 - 1.2 mg/dL   GFR, Estimated >66 >29  mL/min   Anion gap 8 5 - 15  Lipase, blood     Status: None   Collection Time: 10/22/20  2:15 AM  Result Value Ref Range   Lipase 42 11 - 51 U/L  Troponin I (High Sensitivity)     Status: None   Collection Time: 10/22/20  3:21 AM  Result Value Ref Range   Troponin I (  High Sensitivity) 5 <18 ng/L  Troponin I (High Sensitivity)     Status: None   Collection Time: 10/22/20  4:38 AM  Result Value Ref Range   Troponin I (High Sensitivity) 4 <18 ng/L   Imaging Studies: DG Chest Portable 1 View  Result Date: 10/22/2020 CLINICAL DATA:  Cough, left-sided chest and arm pain EXAM: PORTABLE CHEST 1 VIEW COMPARISON:  11/03/2019 FINDINGS: Single frontal view of the chest demonstrates stable enlargement of the cardiac silhouette. No acute airspace disease, effusion, or pneumothorax. No acute bony abnormalities. IMPRESSION: 1. Stable chest, no acute process. Electronically Signed   By: Sharlet Salina M.D.   On: 10/22/2020 01:22    ED COURSE and MDM  Nursing notes, initial and subsequent vitals signs, including pulse oximetry, reviewed and interpreted by myself.  Vitals:   10/22/20 0415 10/22/20 0430 10/22/20 0445 10/22/20 0500  BP:  (!) 112/59  109/66  Pulse:      Resp: 20 13 18 17   Temp:      TempSrc:      SpO2:      Weight:      Height:       Medications  sodium chloride 0.9 % bolus 1,000 mL ( Intravenous Stopped 10/22/20 0237)  potassium chloride SA (KLOR-CON) CR tablet 40 mEq (40 mEq Oral Given 10/22/20 0311)   4:38 AM The patient has been pain-free in the ED.  She states the pain lasted less than 30 minutes total.  We will draw another troponin as the first 2 troponins were not separated by 2 hours.  5:45 AM Patient remains pain-free.  Her chest pain was atypical.  She has had 3 - troponins and her EKG is not showing acute ischemic changes.  She has been in sinus bradycardia but carries a diagnosis of this.  She states it is asymptomatic.   PROCEDURES  Procedures   ED DIAGNOSES      ICD-10-CM   1. Atypical chest pain  R07.89   2. Hypokalemia  E87.6        Khilynn Borntreger, 10/24/20, MD 10/22/20 [id number]

## 2021-01-27 ENCOUNTER — Other Ambulatory Visit: Payer: Self-pay

## 2021-01-27 ENCOUNTER — Emergency Department (HOSPITAL_BASED_OUTPATIENT_CLINIC_OR_DEPARTMENT_OTHER)
Admission: EM | Admit: 2021-01-27 | Discharge: 2021-01-27 | Disposition: A | Discharge status: Home / Self Care | Payer: 59 | Attending: Emergency Medicine | Admitting: Emergency Medicine

## 2021-01-27 ENCOUNTER — Encounter (HOSPITAL_BASED_OUTPATIENT_CLINIC_OR_DEPARTMENT_OTHER): Payer: Self-pay | Admitting: Emergency Medicine

## 2021-01-27 ENCOUNTER — Emergency Department (HOSPITAL_BASED_OUTPATIENT_CLINIC_OR_DEPARTMENT_OTHER): Payer: 59

## 2021-01-27 DIAGNOSIS — E7800 Pure hypercholesterolemia, unspecified: Secondary | ICD-10-CM | POA: Insufficient documentation

## 2021-01-27 DIAGNOSIS — R1012 Left upper quadrant pain: Secondary | ICD-10-CM | POA: Insufficient documentation

## 2021-01-27 DIAGNOSIS — Z7984 Long term (current) use of oral hypoglycemic drugs: Secondary | ICD-10-CM | POA: Insufficient documentation

## 2021-01-27 DIAGNOSIS — R1013 Epigastric pain: Secondary | ICD-10-CM | POA: Insufficient documentation

## 2021-01-27 DIAGNOSIS — E1169 Type 2 diabetes mellitus with other specified complication: Secondary | ICD-10-CM | POA: Insufficient documentation

## 2021-01-27 DIAGNOSIS — I10 Essential (primary) hypertension: Secondary | ICD-10-CM | POA: Diagnosis not present

## 2021-01-27 DIAGNOSIS — Z79899 Other long term (current) drug therapy: Secondary | ICD-10-CM | POA: Diagnosis not present

## 2021-01-27 DIAGNOSIS — R109 Unspecified abdominal pain: Secondary | ICD-10-CM

## 2021-01-27 LAB — COMPREHENSIVE METABOLIC PANEL
ALT: 19 U/L (ref 0–44)
AST: 18 U/L (ref 15–41)
Albumin: 3.7 g/dL (ref 3.5–5.0)
Alkaline Phosphatase: 93 U/L (ref 38–126)
Anion gap: 5 (ref 5–15)
BUN: 23 mg/dL — ABNORMAL HIGH (ref 6–20)
CO2: 26 mmol/L (ref 22–32)
Calcium: 8.6 mg/dL — ABNORMAL LOW (ref 8.9–10.3)
Chloride: 108 mmol/L (ref 98–111)
Creatinine, Ser: 0.65 mg/dL (ref 0.44–1.00)
GFR, Estimated: >60 mL/min (ref >60)
Glucose, Bld: 201 mg/dL — ABNORMAL HIGH (ref 70–99)
Potassium: 4.2 mmol/L (ref 3.5–5.1)
Sodium: 139 mmol/L (ref 135–145)
Total Bilirubin: 0.4 mg/dL (ref 0.3–1.2)
Total Protein: 7.2 g/dL (ref 6.5–8.1)

## 2021-01-27 LAB — LIPASE, BLOOD: Lipase: 48 U/L (ref 11–51)

## 2021-01-27 LAB — CBC WITH DIFFERENTIAL/PLATELET
Abs Immature Granulocytes: 0.03 10*3/uL (ref 0.00–0.07)
Basophils Absolute: 0 10*3/uL (ref 0.0–0.1)
Basophils Relative: 0 %
Eosinophils Absolute: 0.1 10*3/uL (ref 0.0–0.5)
Eosinophils Relative: 1 %
HCT: 40.7 % (ref 36.0–46.0)
Hemoglobin: 13.5 g/dL (ref 12.0–15.0)
Immature Granulocytes: 0 %
Lymphocytes Relative: 28 %
Lymphs Abs: 3.2 10*3/uL (ref 0.7–4.0)
MCH: 30.8 pg (ref 26.0–34.0)
MCHC: 33.2 g/dL (ref 30.0–36.0)
MCV: 92.9 fL (ref 80.0–100.0)
Monocytes Absolute: 0.7 10*3/uL (ref 0.1–1.0)
Monocytes Relative: 6 %
Neutro Abs: 7.4 10*3/uL (ref 1.7–7.7)
Neutrophils Relative %: 65 %
Platelets: 227 10*3/uL (ref 150–400)
RBC: 4.38 MIL/uL (ref 3.87–5.11)
RDW: 13.3 % (ref 11.5–15.5)
WBC: 11.5 10*3/uL — ABNORMAL HIGH (ref 4.0–10.5)
nRBC: 0 % (ref 0.0–0.2)

## 2021-01-27 MED ORDER — IOHEXOL 300 MG/ML  SOLN
100.0000 mL | Freq: Once | INTRAMUSCULAR | Status: AC | PRN
  Administered 2021-01-27: 100 mL via INTRAVENOUS

## 2021-01-27 MED ORDER — LIDOCAINE VISCOUS HCL 2 % MT SOLN
15.0000 mL | Freq: Once | OROMUCOSAL | Status: AC
  Administered 2021-01-27: 15 mL via ORAL
  Filled 2021-01-27: qty 15

## 2021-01-27 MED ORDER — ALUM & MAG HYDROXIDE-SIMETH 200-200-20 MG/5ML PO SUSP
30.0000 mL | Freq: Once | ORAL | Status: AC
  Administered 2021-01-27: 30 mL via ORAL
  Filled 2021-01-27: qty 30

## 2021-01-27 NOTE — ED Provider Notes (Signed)
[facility] EMERGENCY DEPARTMENT Provider Note  CSN: [id number] Arrival date & time: 01/27/21 0150  Chief Complaint(s) Abdominal Cramping  HPI Erin Craig is a 60 y.o. female    Abdominal Cramping This is a new problem. The current episode started more than 2 days ago. The problem occurs daily. The problem has not changed since onset.Pertinent negatives include no chest pain, no headaches and no shortness of breath. The symptoms are aggravated by eating. Nothing relieves the symptoms. She has tried nothing for the symptoms.   Reports that she is having BMs right after eating which is unusual for her.  Past Medical History Past Medical History:  Diagnosis Date   Bradycardia    Diabetes mellitus without complication (HCC)    Heart murmur    Hypercholesterolemia    Hypertension    There are no problems to display for this patient.  Home Medication(s) Prior to Admission medications   Medication Sig Start Date End Date Taking? Authorizing Provider  cyanocobalamin (,VITAMIN B-12,) 1000 MCG/ML injection INJECT IM EVERY MONTHLY 04/07/16   [provider]  metFORMIN (GLUCOPHAGE) 500 MG tablet Take by mouth. 09/29/16   [provider]  ondansetron (ZOFRAN ODT) 4 MG disintegrating tablet 4mg  ODT q4 hours prn nausea/vomit 10/19/20   12/19/20, DO  potassium chloride SA (KLOR-CON) 20 MEQ tablet Take 1 tablet (20 mEq total) by mouth daily. 10/22/20   Molpus, John, MD  atorvastatin (LIPITOR) 10 MG tablet Take 10 mg by mouth daily.  07/14/19  [provider]  furosemide (LASIX) 20 MG tablet Take 1 tablet (20 mg total) by mouth daily. 01/09/18 07/14/19  14/4/20, PA-C  glipiZIDE (GLUCOTROL) 5 MG tablet Take 10 mg by mouth daily before breakfast.  07/14/19  [provider]  lisinopril (PRINIVIL,ZESTRIL) 5 MG tablet Take 5 mg by mouth daily.  07/14/19  [provider]                                                                                                                                     Past Surgical History Past Surgical History:  Procedure Laterality Date   ABDOMINAL HYSTERECTOMY     CESAREAN SECTION     CHOLECYSTECTOMY     FOOT SURGERY     Family History Family History  Problem Relation Age of Onset   Alzheimer's disease Other    Coronary artery disease Other    Breast cancer Other    Arthritis Other    Hypertension Other    Diabetes Other     Social History Social History   Tobacco Use   Smoking status: Never   Smokeless tobacco: Never  Vaping Use   Vaping Use: Never used  Substance Use Topics   Alcohol use: No   Drug use: No   Allergies Patient has no known allergies.  Review of Systems Review of Systems  Respiratory:  Negative for shortness of  breath.   Cardiovascular:  Negative for chest pain.  Neurological:  Negative for headaches.  All other systems are reviewed and are negative for acute change except as noted in the HPI  Physical Exam Vital Signs  I have reviewed the triage vital signs BP (!) 151/80 (BP Location: Right Arm)   Pulse (!) 46   Temp 97.9 F (36.6 C) (Oral)   Resp 18   Ht 5\' 4"  (1.626 m)   Wt 81.6 kg   LMP  (LMP Unknown)   SpO2 100%   BMI 30.90 kg/m   Physical Exam Vitals reviewed.  Constitutional:      General: She is not in acute distress.    Appearance: She is well-developed. She is not diaphoretic.  HENT:     Head: Normocephalic and atraumatic.     Right Ear: External ear normal.     Left Ear: External ear normal.     Nose: Nose normal.  Eyes:     General: No scleral icterus.    Conjunctiva/sclera: Conjunctivae normal.  Neck:     Trachea: Phonation normal.  Cardiovascular:     Rate and Rhythm: Normal rate and regular rhythm.  Pulmonary:     Effort: Pulmonary effort is normal. No respiratory distress.     Breath sounds: No stridor.  Abdominal:     General: There is no distension.     Tenderness: There is abdominal tenderness (mild) in  the epigastric area and left upper quadrant. There is no guarding or rebound.  Musculoskeletal:        General: Normal range of motion.     Cervical back: Normal range of motion.  Neurological:     Mental Status: She is alert and oriented to person, place, and time.  Psychiatric:        Behavior: Behavior normal.    ED Results and Treatments Labs (all labs ordered are listed, but only abnormal results are displayed) Labs Reviewed  CBC WITH DIFFERENTIAL/PLATELET - Abnormal; Notable for the following components:      Result Value   WBC 11.5 (*)    All other components within normal limits  COMPREHENSIVE METABOLIC PANEL - Abnormal; Notable for the following components:   Glucose, Bld 201 (*)    BUN 23 (*)    Calcium 8.6 (*)    All other components within normal limits  LIPASE, BLOOD                                                                                                                         EKG  EKG Interpretation  Date/Time:  Monday January 27 2021 02:05:11 EDT Ventricular Rate:  46 PR Interval:  135 QRS Duration: 88 QT Interval:  450 QTC Calculation: 394 R Axis:   65 Text Interpretation: Sinus bradycardia Borderline repolarization abnormality No significant change since last tracing Confirmed by 06-03-1994 [phone]) on 01/27/2021 2:16:59 AM        Radiology CT ABDOMEN PELVIS W CONTRAST  Result Date: 01/27/2021 CLINICAL DATA:  Suspected diverticulitis. EXAM: CT ABDOMEN AND PELVIS WITH CONTRAST TECHNIQUE: Multidetector CT imaging of the abdomen and pelvis was performed using the standard protocol following bolus administration of intravenous contrast. CONTRAST:  100mL OMNIPAQUE IOHEXOL 300 MG/ML  SOLN COMPARISON:  CT angiography abdomen pelvis 11/11/2017, CT abdomen pelvis 02/10/2009 FINDINGS: Lower chest: No acute abnormality.  Small hiatal hernia. Hepatobiliary: The hepatic parenchyma is diffusely hypodense compared to the splenic parenchyma consistent with fatty  infiltration. No focal liver abnormality. Status post cholecystectomy. No biliary dilatation. Pancreas: No focal lesion. Normal pancreatic contour. No surrounding inflammatory changes. No main pancreatic ductal dilatation. Spleen: Normal in size without focal abnormality. Adrenals/Urinary Tract: No adrenal nodule bilaterally. Bilateral kidneys enhance symmetrically. No hydronephrosis. No hydroureter. The urinary bladder is unremarkable. A 3 mm eccentric rounded filling defect on delayed imaging within the left renal pelvic. Stomach/Bowel: Stomach is within normal limits. No evidence of bowel wall thickening or dilatation. Scattered colonic diverticulosis. Appendix appears normal. Vascular/Lymphatic: No abdominal aorta or iliac aneurysm. Mild atherosclerotic plaque of the aorta and its branches. No abdominal, pelvic, or inguinal lymphadenopathy. Reproductive: Status post hysterectomy. No adnexal masses. Other: No intraperitoneal free fluid. No intraperitoneal free gas. No organized fluid collection. Musculoskeletal: No abdominal wall hernia or abnormality. No suspicious lytic or blastic osseous lesions. No acute displaced fracture. Grade 1 anterolisthesis of L4 on L5. Degenerative changes at the T11 level. IMPRESSION: 1. A 3 mm eccentric rounded filling defect on delayed imaging within the left renal pelvis. 2. Scattered colonic diverticulosis with no acute diverticulitis. 3. Small hiatal hernia. 4. Hepatic steatosis. 5.  Aortic Atherosclerosis (ICD10-I70.0). Electronically Signed   By: Tish FredericksonMorgane  Naveau M.D.   On: 01/27/2021 04:45    Pertinent labs & imaging results that were available during my care of the patient were reviewed by me and considered in my medical decision making (see chart for details).  Medications Ordered in ED Medications  alum & mag hydroxide-simeth (MAALOX/MYLANTA) 200-200-20 MG/5ML suspension 30 mL (30 mLs Oral Given 01/27/21 0239)    And  lidocaine (XYLOCAINE) 2 % viscous mouth solution  15 mL (15 mLs Oral Given 01/27/21 0239)  iohexol (OMNIPAQUE) 300 MG/ML solution 100 mL (100 mLs Intravenous Contrast Given 01/27/21 0327)                                                                                                                                    Procedures Procedures  (including critical care time)  Medical Decision Making / ED Course I have reviewed the nursing notes for this encounter and the patient's prior records (if available in EHR or on provided paperwork).   Allene Dillonnnette David was evaluated in Emergency Department on 01/27/2021 for the symptoms described in the history of present illness. She was evaluated in the context of the global COVID-19 pandemic, which necessitated consideration that the patient might be at risk for infection with the SARS-CoV-2 virus  that causes COVID-19. Institutional protocols and algorithms that pertain to the evaluation of patients at risk for COVID-19 are in a state of rapid change based on information released by regulatory bodies including the CDC and federal and state organizations. These policies and algorithms were followed during the patient's care in the ED.  Work-up reassuring without serious intra-abdominal dermatosis infectious process. No significant electrolyte derangements or renal insufficiency.  Recommended supportive management and PCP follow-up as needed      Final Clinical Impression(s) / ED Diagnoses Final diagnoses:  Abdominal discomfort    The patient appears reasonably screened and/or stabilized for discharge and I doubt any other medical condition or other [facility] requiring further screening, evaluation, or treatment in the ED at this time prior to discharge. Safe for discharge with strict return precautions.  Disposition: Discharge  Condition: Good  I have discussed the results, Dx and Tx plan with the patient/family who expressed understanding and agree(s) with the plan. Discharge instructions discussed at  length. The patient/family was given strict return precautions who verbalized understanding of the instructions. No further questions at time of discharge.    ED Discharge Orders     None       Follow Up: Tracey Harries, MD [address] [place] [address] [phone]  Call  if symptoms do not improve or  worsen, in 5-7 days     This chart was dictated using voice recognition software.  Despite best efforts to proofread,  errors can occur which can change the documentation meaning.    Nira Conn, MD 01/27/21 [phone]

## 2021-01-27 NOTE — ED Triage Notes (Signed)
Reports generalized abdominal cramping for the last few days.  Denies any n/v/d.  Does reports frequent soft bowel movements however.

## 2021-05-04 DIAGNOSIS — K449 Diaphragmatic hernia without obstruction or gangrene: Secondary | ICD-10-CM | POA: Insufficient documentation

## 2021-05-04 DIAGNOSIS — K219 Gastro-esophageal reflux disease without esophagitis: Secondary | ICD-10-CM | POA: Insufficient documentation

## 2021-05-06 DIAGNOSIS — K222 Esophageal obstruction: Secondary | ICD-10-CM | POA: Insufficient documentation

## 2021-06-21 ENCOUNTER — Emergency Department (HOSPITAL_BASED_OUTPATIENT_CLINIC_OR_DEPARTMENT_OTHER)
Admission: EM | Admit: 2021-06-21 | Discharge: 2021-06-21 | Disposition: A | Discharge status: Home / Self Care | Payer: 59 | Attending: Emergency Medicine | Admitting: Emergency Medicine

## 2021-06-21 ENCOUNTER — Emergency Department (HOSPITAL_BASED_OUTPATIENT_CLINIC_OR_DEPARTMENT_OTHER): Payer: 59

## 2021-06-21 ENCOUNTER — Encounter (HOSPITAL_BASED_OUTPATIENT_CLINIC_OR_DEPARTMENT_OTHER): Payer: Self-pay | Admitting: *Deleted

## 2021-06-21 ENCOUNTER — Other Ambulatory Visit: Payer: Self-pay

## 2021-06-21 DIAGNOSIS — M549 Dorsalgia, unspecified: Secondary | ICD-10-CM | POA: Insufficient documentation

## 2021-06-21 DIAGNOSIS — Z79899 Other long term (current) drug therapy: Secondary | ICD-10-CM | POA: Diagnosis not present

## 2021-06-21 DIAGNOSIS — E119 Type 2 diabetes mellitus without complications: Secondary | ICD-10-CM | POA: Diagnosis not present

## 2021-06-21 DIAGNOSIS — Z7984 Long term (current) use of oral hypoglycemic drugs: Secondary | ICD-10-CM | POA: Insufficient documentation

## 2021-06-21 DIAGNOSIS — R3915 Urgency of urination: Secondary | ICD-10-CM | POA: Insufficient documentation

## 2021-06-21 DIAGNOSIS — R109 Unspecified abdominal pain: Secondary | ICD-10-CM | POA: Diagnosis present

## 2021-06-21 DIAGNOSIS — I10 Essential (primary) hypertension: Secondary | ICD-10-CM | POA: Diagnosis not present

## 2021-06-21 LAB — BASIC METABOLIC PANEL
Anion gap: 7 (ref 5–15)
BUN: 16 mg/dL (ref 6–20)
CO2: 24 mmol/L (ref 22–32)
Calcium: 8.8 mg/dL — ABNORMAL LOW (ref 8.9–10.3)
Chloride: 105 mmol/L (ref 98–111)
Creatinine, Ser: 0.72 mg/dL (ref 0.44–1.00)
GFR, Estimated: >60 mL/min (ref >60)
Glucose, Bld: 168 mg/dL — ABNORMAL HIGH (ref 70–99)
Potassium: 3.9 mmol/L (ref 3.5–5.1)
Sodium: 136 mmol/L (ref 135–145)

## 2021-06-21 LAB — URINALYSIS, ROUTINE W REFLEX MICROSCOPIC
Bilirubin Urine: NEGATIVE
Glucose, UA: NEGATIVE mg/dL
Hgb urine dipstick: NEGATIVE
Ketones, ur: NEGATIVE mg/dL
Nitrite: NEGATIVE
Protein, ur: NEGATIVE mg/dL
Specific Gravity, Urine: 1.023 (ref 1.005–1.030)
pH: 6.5 (ref 5.0–8.0)

## 2021-06-21 LAB — CBC WITH DIFFERENTIAL/PLATELET
Abs Immature Granulocytes: 0.06 10*3/uL (ref 0.00–0.07)
Basophils Absolute: 0 10*3/uL (ref 0.0–0.1)
Basophils Relative: 0 %
Eosinophils Absolute: 0 10*3/uL (ref 0.0–0.5)
Eosinophils Relative: 0 %
HCT: 41.8 % (ref 36.0–46.0)
Hemoglobin: 13.7 g/dL (ref 12.0–15.0)
Immature Granulocytes: 0 %
Lymphocytes Relative: 10 %
Lymphs Abs: 1.4 10*3/uL (ref 0.7–4.0)
MCH: 31.2 pg (ref 26.0–34.0)
MCHC: 32.8 g/dL (ref 30.0–36.0)
MCV: 95.2 fL (ref 80.0–100.0)
Monocytes Absolute: 0.7 10*3/uL (ref 0.1–1.0)
Monocytes Relative: 5 %
Neutro Abs: 11.8 10*3/uL — ABNORMAL HIGH (ref 1.7–7.7)
Neutrophils Relative %: 85 %
Platelets: 229 10*3/uL (ref 150–400)
RBC: 4.39 MIL/uL (ref 3.87–5.11)
RDW: 13.2 % (ref 11.5–15.5)
WBC: 13.9 10*3/uL — ABNORMAL HIGH (ref 4.0–10.5)
nRBC: 0 % (ref 0.0–0.2)

## 2021-06-21 MED ORDER — OXYCODONE-ACETAMINOPHEN 5-325 MG PO TABS
1.0000 | ORAL_TABLET | Freq: Once | ORAL | Status: AC
  Administered 2021-06-21: 1 via ORAL
  Filled 2021-06-21: qty 1

## 2021-06-21 MED ORDER — KETOROLAC TROMETHAMINE 30 MG/ML IJ SOLN
30.0000 mg | Freq: Once | INTRAMUSCULAR | Status: AC
  Administered 2021-06-21: 30 mg via INTRAVENOUS
  Filled 2021-06-21: qty 1

## 2021-06-21 MED ORDER — OXYCODONE-ACETAMINOPHEN 5-325 MG PO TABS: 1.0000 | ORAL_TABLET | ORAL | 0 refills | Status: DC | PRN

## 2021-06-21 NOTE — ED Provider Notes (Signed)
[facility] EMERGENCY DEPT Provider Note   CSN: [id number] Arrival date & time: 06/21/21  2952     History Chief Complaint  Patient presents with   Back Pain    Erin Craig is a 60 y.o. female.  The history is provided by the patient.  Back Pain She has history of hypertension, diabetes, hyperlipidemia and is currently on an antibiotic for urinary tract infection and comes in complaining of pain in her left flank which started yesterday.  Pain is severe and she rates it at 10/10.  It is worse if she lays on her left side.  There is no associated nausea or vomiting.  She states that her urinary symptoms are improving and she is not complaining of any dysuria, urinary urgency but has had some mild urinary frequency.  She has tried taking ibuprofen without any relief.   Past Medical History:  Diagnosis Date   Bradycardia    Diabetes mellitus without complication (HCC)    Heart murmur    Hypercholesterolemia    Hypertension     There are no problems to display for this patient.   Past Surgical History:  Procedure Laterality Date   ABDOMINAL HYSTERECTOMY     CESAREAN SECTION     CHOLECYSTECTOMY     FOOT SURGERY       OB History   No obstetric history on file.     Family History  Problem Relation Age of Onset   Alzheimer's disease Other    Coronary artery disease Other    Breast cancer Other    Arthritis Other    Hypertension Other    Diabetes Other     Social History   Tobacco Use   Smoking status: Never   Smokeless tobacco: Never  Vaping Use   Vaping Use: Never used  Substance Use Topics   Alcohol use: No   Drug use: No    Home Medications Prior to Admission medications   Medication Sig Start Date End Date Taking? Authorizing Provider  cyanocobalamin (,VITAMIN B-12,) 1000 MCG/ML injection INJECT IM EVERY MONTHLY 04/07/16   [provider]  metFORMIN (GLUCOPHAGE) 500 MG tablet Take by mouth. 09/29/16   [provider]  ondansetron (ZOFRAN ODT) 4 MG disintegrating tablet 4mg  ODT q4 hours prn nausea/vomit 10/19/20   12/19/20, DO  potassium chloride SA (KLOR-CON) 20 MEQ tablet Take 1 tablet (20 mEq total) by mouth daily. 10/22/20   Molpus, John, MD  atorvastatin (LIPITOR) 10 MG tablet Take 10 mg by mouth daily.  07/14/19  [provider]  furosemide (LASIX) 20 MG tablet Take 1 tablet (20 mg total) by mouth daily. 01/09/18 07/14/19  14/4/20, PA-C  glipiZIDE (GLUCOTROL) 5 MG tablet Take 10 mg by mouth daily before breakfast.  07/14/19  [provider]  lisinopril (PRINIVIL,ZESTRIL) 5 MG tablet Take 5 mg by mouth daily.  07/14/19  [provider]    Allergies    Patient has no known allergies.  Review of Systems   Review of Systems  Musculoskeletal:  Positive for back pain.  All other systems reviewed and are negative.  Physical Exam Updated Vital Signs BP 131/79 (BP Location: Right Arm)   Pulse (!) 52   Temp 98.3 F (36.8 C) (Oral)   Resp 19   LMP  (LMP Unknown)   SpO2 99%   Physical Exam Vitals and nursing note reviewed.  60 year old female, appears uncomfortable, but is in no acute distress. Vital signs are  significant for slightly slow heart rate. Oxygen saturation is 99%, which is normal. Head is normocephalic and atraumatic. PERRLA, EOMI. Oropharynx is clear. Neck is nontender and supple without adenopathy or JVD. Back is nontender in the midline.  There is mild left CVA tenderness. Lungs are clear without rales, wheezes, or rhonchi. Chest is nontender. Heart has regular rate and rhythm without murmur. Abdomen is soft, flat, nontender without masses or hepatosplenomegaly and peristalsis is hypoactive. Extremities have no cyanosis or edema, full range of motion is present. Skin is warm and dry without rash. Neurologic: Mental status is normal, cranial nerves are intact, moves all extremities equally  ED Results / Procedures / Treatments   Labs (all labs  ordered are listed, but only abnormal results are displayed) Labs Reviewed  URINALYSIS, ROUTINE W REFLEX MICROSCOPIC - Abnormal; Notable for the following components:      Result Value   Leukocytes,Ua TRACE (*)    Bacteria, UA RARE (*)    All other components within normal limits  BASIC METABOLIC PANEL - Abnormal; Notable for the following components:   Glucose, Bld 168 (*)    Calcium 8.8 (*)    All other components within normal limits  CBC WITH DIFFERENTIAL/PLATELET - Abnormal; Notable for the following components:   WBC 13.9 (*)    Neutro Abs 11.8 (*)    All other components within normal limits    Radiology CT Renal Stone Study  Result Date: 06/21/2021 CLINICAL DATA:  60 year old female with history of left lower back pain since Thursday. Suspected kidney stone. EXAM: CT ABDOMEN AND PELVIS WITHOUT CONTRAST TECHNIQUE: Multidetector CT imaging of the abdomen and pelvis was performed following the standard protocol without IV contrast. COMPARISON:  CT the abdomen and pelvis 01/27/2021. FINDINGS: Lower chest: Mild cardiomegaly. Hepatobiliary: No definite suspicious cystic or solid hepatic lesions are confidently identified on today's noncontrast CT examination. Status post cholecystectomy. Pancreas: No definite pancreatic mass or peripancreatic fluid collections or inflammatory changes are noted on today's noncontrast CT examination. Spleen: Unremarkable. Adrenals/Urinary Tract: There are no abnormal calcifications within the collecting system of either kidney, along the course of either ureter, or within the lumen of the urinary bladder. No hydroureteronephrosis or perinephric stranding to suggest urinary tract obstruction at this time. The unenhanced appearance of the kidneys is unremarkable bilaterally. Unenhanced appearance of the urinary bladder is normal. Bilateral adrenal glands are normal in appearance. Stomach/Bowel: Unenhanced appearance of the stomach is normal. There is no pathologic  dilatation of small bowel or colon. Normal appendix. Vascular/Lymphatic: Aortic atherosclerosis. No lymphadenopathy noted in the abdomen or pelvis. Reproductive: Status post hysterectomy. Ovaries are not confidently identified may be surgically absent or atrophic. Other: No significant volume of ascites.  No pneumoperitoneum. Musculoskeletal: There are no aggressive appearing lytic or blastic lesions noted in the visualized portions of the skeleton. IMPRESSION: 1. No acute findings are noted in the abdomen or pelvis to account for the patient's symptoms. Specifically, no urinary tract calculi no findings of urinary tract obstruction are noted at this time. 2. Aortic atherosclerosis. 3. Additional incidental findings, as above. Electronically Signed   By: Trudie Reed M.D.   On: 06/21/2021 05:30    Procedures Procedures   Medications Ordered in ED Medications  ketorolac (TORADOL) 30 MG/ML injection 30 mg (30 mg Intravenous Given 06/21/21 0432)  oxyCODONE-acetaminophen (PERCOCET/ROXICET) 5-325 MG per tablet 1 tablet (1 tablet Oral Given 06/21/21 0518)    ED Course  I have reviewed the triage vital signs and the nursing notes.  Pertinent labs & imaging results that were available during my care of the patient were reviewed by me and considered in my medical decision making (see chart for details).   MDM Rules/Calculators/A&P                         Left flank pain in the setting of recent diagnosis of UTI.  Consider pyelonephritis, urolithiasis.  Also consider diverticulitis.  Old records were reviewed, and she did have a CT scan of abdomen and pelvis last June showing no evidence of any abdominal aneurysm.  Also, office note from 11/10 where she was diagnosed with cystitis and given prescription for trimethoprim-sulfamethoxazole.  We will recheck urinalysis today and sent for renal stone protocol CT scan.  Urinalysis is actually normal today.  CT scan shows no acute process.  CBC and metabolic  panel are significant only for mildly elevated random glucose level.  She did not get any relief with ketorolac, but could CBC and metabolic panel are significant only for mildly elevated random glucose level.  She had no pain relief with ketorolac, had good pain relief with oxycodone-acetaminophen.  Cause for her pain is unclear.  She is discharged with prescription for oxycodone-acetaminophen and she is instructed to follow-up with her primary care provider.  Return precautions discussed.  Final Clinical Impression(s) / ED Diagnoses Final diagnoses:  Left flank pain    Rx / DC Orders ED Discharge Orders          Ordered    oxyCODONE-acetaminophen (PERCOCET) 5-325 MG tablet  Every 4 hours PRN        06/21/21 0554             Dione Booze, MD 06/21/21 0559

## 2021-06-21 NOTE — ED Notes (Signed)
Patient transported to CT 

## 2021-06-21 NOTE — ED Triage Notes (Signed)
Lower left back pain since Thursday. Pt is on bactrim for urinary symptoms. Denies vomiting or urinary pain.

## 2021-06-21 NOTE — ED Notes (Signed)
Pt verbalizes understanding of discharge instructions. Opportunity for questioning and answers were provided. [place]and removed by staff, pt discharged from ED to home. Educated to pick up Rx for pain. Work note given.

## 2021-06-21 NOTE — Discharge Instructions (Addendum)
Your evaluation did not find the cause of your pain.  Urinalysis did not show any evidence of infection, CT scan did not show any evidence of kidney stones.  You have been given a prescription for oxycodone-acetaminophen which you can take for pain.  Return to the emergency department if pain is not being adequately controlled, you start running a fever, or you have any other new or concerning symptoms.

## 2021-06-23 ENCOUNTER — Encounter (HOSPITAL_BASED_OUTPATIENT_CLINIC_OR_DEPARTMENT_OTHER): Payer: Self-pay

## 2021-06-23 ENCOUNTER — Emergency Department (HOSPITAL_BASED_OUTPATIENT_CLINIC_OR_DEPARTMENT_OTHER)
Admission: EM | Admit: 2021-06-23 | Discharge: 2021-06-23 | Disposition: A | Discharge status: Home / Self Care | Payer: 59 | Attending: Emergency Medicine | Admitting: Emergency Medicine

## 2021-06-23 DIAGNOSIS — I10 Essential (primary) hypertension: Secondary | ICD-10-CM | POA: Diagnosis not present

## 2021-06-23 DIAGNOSIS — Z7984 Long term (current) use of oral hypoglycemic drugs: Secondary | ICD-10-CM | POA: Diagnosis not present

## 2021-06-23 DIAGNOSIS — M545 Low back pain, unspecified: Secondary | ICD-10-CM | POA: Insufficient documentation

## 2021-06-23 DIAGNOSIS — Z79899 Other long term (current) drug therapy: Secondary | ICD-10-CM | POA: Diagnosis not present

## 2021-06-23 DIAGNOSIS — E119 Type 2 diabetes mellitus without complications: Secondary | ICD-10-CM | POA: Insufficient documentation

## 2021-06-23 LAB — URINALYSIS, ROUTINE W REFLEX MICROSCOPIC
Bilirubin Urine: NEGATIVE
Glucose, UA: NEGATIVE mg/dL
Hgb urine dipstick: NEGATIVE
Ketones, ur: NEGATIVE mg/dL
Leukocytes,Ua: NEGATIVE
Nitrite: NEGATIVE
Protein, ur: NEGATIVE mg/dL
Specific Gravity, Urine: 1.01 (ref 1.005–1.030)
pH: 5.5 (ref 5.0–8.0)

## 2021-06-23 MED ORDER — IBUPROFEN 600 MG PO TABS: 600.0000 mg | ORAL_TABLET | Freq: Four times a day (QID) | ORAL | 0 refills | Status: DC | PRN

## 2021-06-23 MED ORDER — KETOROLAC TROMETHAMINE 30 MG/ML IJ SOLN
30.0000 mg | Freq: Once | INTRAMUSCULAR | Status: AC
  Administered 2021-06-23: 30 mg via INTRAVENOUS
  Filled 2021-06-23: qty 1

## 2021-06-23 MED ORDER — CYCLOBENZAPRINE HCL 5 MG PO TABS: 10.0000 mg | ORAL_TABLET | Freq: Two times a day (BID) | ORAL | 0 refills | Status: DC | PRN

## 2021-06-23 MED ORDER — CYCLOBENZAPRINE HCL 5 MG PO TABS
5.0000 mg | ORAL_TABLET | Freq: Once | ORAL | Status: AC
  Administered 2021-06-23: 5 mg via ORAL
  Filled 2021-06-23: qty 1

## 2021-06-23 MED ORDER — SODIUM CHLORIDE 0.9 % IV BOLUS
1000.0000 mL | Freq: Once | INTRAVENOUS | Status: AC
  Administered 2021-06-23: 1000 mL via INTRAVENOUS

## 2021-06-23 NOTE — ED Provider Notes (Signed)
[facility] EMERGENCY DEPT Provider Note   CSN: [id number] Arrival date & time: 06/23/21  0308     History Chief Complaint  Patient presents with   Flank Pain    Erin Craig is a 60 y.o. female.  HPI     This is a 60 year old female with a history of diabetes, hypertension, hypercholesterolemia who presents with ongoing back pain.  Patient reports ongoing left-sided back pain.  She was seen and evaluated 2 days ago for the same.  At that time she had urine studies, basic labs, CT scan of the abdomen and pelvis that was normal.  Unclear etiology for pain.  Patient reports she has had ongoing pain.  She states she has been unable to sleep at night.  She rates her pain at 10 out of 10.  She took Percocet last night with minimal relief.  Pain is sometimes worsened with movements.  She denies any ongoing urinary symptoms.  She previously had been treated for UTI with Bactrim and finished her antibiotics yesterday.  She is not had any fevers.  No weakness, numbness, tingling of the lower extremities, no bowel or bladder issues.  Patient denies any rashes.  Past Medical History:  Diagnosis Date   Bradycardia    Diabetes mellitus without complication (HCC)    Heart murmur    Hypercholesterolemia    Hypertension     There are no problems to display for this patient.   Past Surgical History:  Procedure Laterality Date   ABDOMINAL HYSTERECTOMY     CESAREAN SECTION     CHOLECYSTECTOMY     FOOT SURGERY       OB History   No obstetric history on file.     Family History  Problem Relation Age of Onset   Alzheimer's disease Other    Coronary artery disease Other    Breast cancer Other    Arthritis Other    Hypertension Other    Diabetes Other     Social History   Tobacco Use   Smoking status: Never   Smokeless tobacco: Never  Vaping Use   Vaping Use: Never used  Substance Use Topics   Alcohol use: No   Drug use: No    Home Medications Prior to  Admission medications   Medication Sig Start Date End Date Taking? Authorizing Provider  cyclobenzaprine (FLEXERIL) 5 MG tablet Take 2 tablets (10 mg total) by mouth 2 (two) times daily as needed for muscle spasms. 06/23/21  Yes Merrie Epler, Mayer Masker, MD  ibuprofen (ADVIL) 600 MG tablet Take 1 tablet (600 mg total) by mouth every 6 (six) hours as needed. 06/23/21  Yes Aiva Miskell, Mayer Masker, MD  cyanocobalamin (,VITAMIN B-12,) 1000 MCG/ML injection INJECT IM EVERY MONTHLY 04/07/16   [provider]  metFORMIN (GLUCOPHAGE) 500 MG tablet Take by mouth. 09/29/16   [provider]  ondansetron (ZOFRAN ODT) 4 MG disintegrating tablet 4mg  ODT q4 hours prn nausea/vomit 10/19/20   12/19/20, DO  oxyCODONE-acetaminophen (PERCOCET) 5-325 MG tablet Take 1 tablet by mouth every 4 (four) hours as needed for moderate pain. 06/21/21   13/12/22, MD  potassium chloride SA (KLOR-CON) 20 MEQ tablet Take 1 tablet (20 mEq total) by mouth daily. 10/22/20   Molpus, John, MD  atorvastatin (LIPITOR) 10 MG tablet Take 10 mg by mouth daily.  07/14/19  [provider]  furosemide (LASIX) 20 MG tablet Take 1 tablet (20 mg total) by mouth daily. 01/09/18 07/14/19  14/4/20, PA-C  glipiZIDE (GLUCOTROL) 5 MG tablet Take 10 mg by mouth daily before breakfast.  07/14/19  [provider]  lisinopril (PRINIVIL,ZESTRIL) 5 MG tablet Take 5 mg by mouth daily.  07/14/19  [provider]    Allergies    Patient has no known allergies.  Review of Systems   Review of Systems  Constitutional:  Negative for fever.  Respiratory:  Negative for shortness of breath.   Cardiovascular:  Negative for chest pain.  Gastrointestinal:  Negative for abdominal pain, constipation, diarrhea, nausea and vomiting.  Genitourinary:  Negative for dysuria and urgency.  Musculoskeletal:  Positive for back pain.  All other systems reviewed and are negative.  Physical Exam Updated Vital Signs BP 134/69    Pulse (!) 42   Temp 98.1 F (36.7 C) (Oral)   Resp 19   Ht 1.626 m (5\' 4" )   Wt 77.6 kg   LMP  (LMP Unknown)   SpO2 99%   BMI 29.35 kg/m   Physical Exam Vitals and nursing note reviewed.  Constitutional:      Appearance: She is well-developed.     Comments: Tearful, nontoxic appearing  HENT:     Head: Normocephalic and atraumatic.     Nose: Nose normal.     Mouth/Throat:     Mouth: Mucous membranes are moist.  Eyes:     Pupils: Pupils are equal, round, and reactive to light.  Cardiovascular:     Rate and Rhythm: Normal rate and regular rhythm.     Heart sounds: Normal heart sounds.  Pulmonary:     Effort: Pulmonary effort is normal. No respiratory distress.     Breath sounds: No wheezing.  Abdominal:     General: Bowel sounds are normal.     Palpations: Abdomen is soft.     Tenderness: There is no abdominal tenderness. There is no right CVA tenderness, left CVA tenderness, guarding or rebound.  Musculoskeletal:     Cervical back: Neck supple.     Comments: Point tenderness to palpation left lower back in the left paraspinous muscle region, no midline tenderness, step-off, deformity, negative straight leg raise bilaterally  Skin:    General: Skin is warm and dry.     Findings: No rash.  Neurological:     Mental Status: She is alert and oriented to person, place, and time.     Comments: 5 out of 5 strength bilateral lower extremities, normal patellar reflexes bilateral  Psychiatric:        Mood and Affect: Mood normal.    ED Results / Procedures / Treatments   Labs (all labs ordered are listed, but only abnormal results are displayed) Labs Reviewed  URINALYSIS, ROUTINE W REFLEX MICROSCOPIC - Abnormal; Notable for the following components:      Result Value   Color, Urine COLORLESS (*)    Bacteria, UA RARE (*)    All other components within normal limits  URINE CULTURE    EKG None  Radiology No results found.  Procedures Procedures   Medications Ordered  in ED Medications  ketorolac (TORADOL) 30 MG/ML injection 30 mg (30 mg Intravenous Given 06/23/21 0343)  sodium chloride 0.9 % bolus 1,000 mL (1,000 mLs Intravenous New Bag/Given 06/23/21 0343)  cyclobenzaprine (FLEXERIL) tablet 5 mg (5 mg Oral Given 06/23/21 0343)    ED Course  I have reviewed the triage vital signs and the nursing notes.  Pertinent labs & imaging results that were available during my care of the patient were reviewed by  me and considered in my medical decision making (see chart for details).    MDM Rules/Calculators/A&P                           Patient presents with ongoing left-sided back and flank pain.  Was seen and evaluated 2 days ago for the same.  At that time she had a fairly negative work-up including imaging.  No urinary stones noted.  Urinalysis was clean without evidence of UTI.  She finished her course of antibiotics.  She is tearful but nontoxic-appearing and vital signs are reassuring.  She has reproducible tenderness.  There are no overlying skin changes to suggest rash or hematoma.  No evidence of shingles.  Repeat urinalysis was obtained and again appears reassuring.  We will send culture to ensure clearance.  She is afebrile.  Doubt pyelonephritis.  I have independently reviewed her CT scan.  There were no stones even in her kidneys.  This would make kidney stones highly unlikely.  Given reproducible nature of pain and pain with range of motion, musculoskeletal etiology is a consideration.  She has normal neurologic exam.  No signs or symptoms of cauda equina.  Patient was given Toradol and Flexeril.  On recheck she reports some improvement.  We will add an anti-inflammatory to her pain regimen and muscle relaxant.  At this time do not feel she needs further evaluation given recent negative work-up 2 days ago.  We will have her follow-up with her primary physician.  After history, exam, and medical workup I feel the patient has been appropriately medically  screened and is safe for discharge home. Pertinent diagnoses were discussed with the patient. Patient was given return precautions.  Final Clinical Impression(s) / ED Diagnoses Final diagnoses:  Acute left-sided low back pain without sciatica    Rx / DC Orders ED Discharge Orders          Ordered    ibuprofen (ADVIL) 600 MG tablet  Every 6 hours PRN        06/23/21 0508    cyclobenzaprine (FLEXERIL) 5 MG tablet  2 times daily PRN        06/23/21 0508             Shon Baton, MD 06/23/21 [phone]

## 2021-06-23 NOTE — ED Triage Notes (Signed)
Pt is present for ongoing left flank pain. Pt seen Friday for sx and was sent home with pain meds. Pain med last taken last night. Finished abx yesterday evening. Pt unable to sleep due to the pain. Pt tearful and moaning during triage.

## 2021-06-23 NOTE — Discharge Instructions (Signed)
You were seen today for ongoing back pain.  Your urine remains negative.  You will be treated for musculoskeletal pain.  Add ibuprofen and Flexeril to your pain regimen at home.  Do not drive while taking Flexeril.  Follow-up with your primary physician.

## 2021-06-24 LAB — URINE CULTURE: Culture: NO GROWTH

## 2021-08-22 ENCOUNTER — Other Ambulatory Visit: Payer: Self-pay

## 2021-08-22 ENCOUNTER — Emergency Department (HOSPITAL_BASED_OUTPATIENT_CLINIC_OR_DEPARTMENT_OTHER): Payer: 59

## 2021-08-22 ENCOUNTER — Encounter (HOSPITAL_BASED_OUTPATIENT_CLINIC_OR_DEPARTMENT_OTHER): Payer: Self-pay

## 2021-08-22 ENCOUNTER — Emergency Department (HOSPITAL_BASED_OUTPATIENT_CLINIC_OR_DEPARTMENT_OTHER)
Admission: EM | Admit: 2021-08-22 | Discharge: 2021-08-22 | Disposition: A | Discharge status: Home / Self Care | Payer: 59 | Attending: Emergency Medicine | Admitting: Emergency Medicine

## 2021-08-22 DIAGNOSIS — Z7984 Long term (current) use of oral hypoglycemic drugs: Secondary | ICD-10-CM | POA: Diagnosis not present

## 2021-08-22 DIAGNOSIS — Z79899 Other long term (current) drug therapy: Secondary | ICD-10-CM | POA: Insufficient documentation

## 2021-08-22 DIAGNOSIS — R197 Diarrhea, unspecified: Secondary | ICD-10-CM | POA: Insufficient documentation

## 2021-08-22 DIAGNOSIS — R1084 Generalized abdominal pain: Secondary | ICD-10-CM

## 2021-08-22 DIAGNOSIS — R1032 Left lower quadrant pain: Secondary | ICD-10-CM | POA: Diagnosis present

## 2021-08-22 DIAGNOSIS — R112 Nausea with vomiting, unspecified: Secondary | ICD-10-CM

## 2021-08-22 LAB — URINALYSIS, ROUTINE W REFLEX MICROSCOPIC
Bilirubin Urine: NEGATIVE
Glucose, UA: NEGATIVE mg/dL
Ketones, ur: NEGATIVE mg/dL
Leukocytes,Ua: NEGATIVE
Nitrite: NEGATIVE
Protein, ur: NEGATIVE mg/dL
Specific Gravity, Urine: 1.025 (ref 1.005–1.030)
pH: 7 (ref 5.0–8.0)

## 2021-08-22 LAB — COMPREHENSIVE METABOLIC PANEL
ALT: 22 U/L (ref 0–44)
AST: 18 U/L (ref 15–41)
Albumin: 3.5 g/dL (ref 3.5–5.0)
Alkaline Phosphatase: 71 U/L (ref 38–126)
Anion gap: 7 (ref 5–15)
BUN: 19 mg/dL (ref 6–20)
CO2: 25 mmol/L (ref 22–32)
Calcium: 8.9 mg/dL (ref 8.9–10.3)
Chloride: 105 mmol/L (ref 98–111)
Creatinine, Ser: 0.9 mg/dL (ref 0.44–1.00)
GFR, Estimated: >60 mL/min (ref >60)
Glucose, Bld: 78 mg/dL (ref 70–99)
Potassium: 3.5 mmol/L (ref 3.5–5.1)
Sodium: 137 mmol/L (ref 135–145)
Total Bilirubin: 0.4 mg/dL (ref 0.3–1.2)
Total Protein: 6.7 g/dL (ref 6.5–8.1)

## 2021-08-22 LAB — CBC
HCT: 42.5 % (ref 36.0–46.0)
Hemoglobin: 14.1 g/dL (ref 12.0–15.0)
MCH: 31.8 pg (ref 26.0–34.0)
MCHC: 33.2 g/dL (ref 30.0–36.0)
MCV: 95.9 fL (ref 80.0–100.0)
Platelets: 226 10*3/uL (ref 150–400)
RBC: 4.43 MIL/uL (ref 3.87–5.11)
RDW: 13.2 % (ref 11.5–15.5)
WBC: 11.4 10*3/uL — ABNORMAL HIGH (ref 4.0–10.5)
nRBC: 0 % (ref 0.0–0.2)

## 2021-08-22 LAB — URINALYSIS, MICROSCOPIC (REFLEX)

## 2021-08-22 LAB — LIPASE, BLOOD: Lipase: 47 U/L (ref 11–51)

## 2021-08-22 MED ORDER — IOHEXOL 300 MG/ML  SOLN
100.0000 mL | Freq: Once | INTRAMUSCULAR | Status: AC | PRN
  Administered 2021-08-22: 100 mL via INTRAVENOUS

## 2021-08-22 MED ORDER — DICYCLOMINE HCL 20 MG PO TABS: 20.0000 mg | ORAL_TABLET | Freq: Three times a day (TID) | ORAL | 0 refills | Status: DC | PRN

## 2021-08-22 MED ORDER — ONDANSETRON 4 MG PO TBDP: ORAL_TABLET | ORAL | 0 refills | Status: DC

## 2021-08-22 MED ORDER — FENTANYL CITRATE PF 50 MCG/ML IJ SOSY
50.0000 ug | PREFILLED_SYRINGE | Freq: Once | INTRAMUSCULAR | Status: AC
  Administered 2021-08-22: 50 ug via INTRAVENOUS
  Filled 2021-08-22: qty 1

## 2021-08-22 MED ORDER — KETOROLAC TROMETHAMINE 30 MG/ML IJ SOLN
30.0000 mg | Freq: Once | INTRAMUSCULAR | Status: AC
  Administered 2021-08-22: 30 mg via INTRAVENOUS
  Filled 2021-08-22: qty 1

## 2021-08-22 NOTE — ED Notes (Addendum)
First contact with patient. Patient arrived via triage from home with complaints of lower abdominal pain with diarrhea x 2 days. Patient denies n/v. Pt is A&OX 4. Respirations even/unlabored. Call light within reach. Patient updated on plan of care. Will continue to monitor patient.   2011: attempted to start an IV x 2 without success. Second RN attempting at this time.   2039: Patient states pain has not improved since being medicated. MD made aware.   2102: Report given to [facility] for continuation of care.

## 2021-08-22 NOTE — Discharge Instructions (Signed)
You were seen in the emergency department for nausea vomiting diarrhea and abdominal pain.  Your lab work and CAT scan did not show an obvious explanation for your symptoms.  We are prescribing some medication for spasms and nausea.  Please keep well-hydrated.  Follow-up with your primary care doctor.  Return to the emergency department if any worsening or concerning symptoms

## 2021-08-22 NOTE — ED Notes (Signed)
Taken to CT at this time. 

## 2021-08-22 NOTE — ED Provider Notes (Signed)
[place] EMERGENCY DEPARTMENT Provider Note   CSN: [id number] Arrival date & time: 08/22/21  1825     History  Chief Complaint  Patient presents with   Abdominal Pain    Erin Craig is a 61 y.o. female.  She is complaining of crampy lower abdominal pain and frequent diarrhea that started yesterday after eating.  Kept her up all night long.  Continues with symptoms today.  No fevers chills nausea vomiting.  No blood in the diarrhea.  The history is provided by the patient.  Abdominal Pain Pain location:  LLQ and RLQ Pain quality: cramping   Pain radiates to:  Does not radiate Pain severity:  Severe Onset quality:  Gradual Duration:  24 hours Timing:  Intermittent Progression:  Unchanged Chronicity:  New Context: eating   Relieved by:  Nothing Worsened by:  Eating Ineffective treatments:  None tried Associated symptoms: diarrhea   Associated symptoms: no chest pain, no dysuria, no fever, no hematemesis, no hematochezia, no shortness of breath, no sore throat and no vomiting       Home Medications Prior to Admission medications   Medication Sig Start Date End Date Taking? Authorizing Provider  cyanocobalamin (,VITAMIN B-12,) 1000 MCG/ML injection INJECT 1ML IM EVERY MONTHLY 04/07/16   [provider]  cyclobenzaprine (FLEXERIL) 5 MG tablet Take 2 tablets (10 mg total) by mouth 2 (two) times daily as needed for muscle spasms. 06/23/21   Horton, Barbette Hair, MD  ibuprofen (ADVIL) 600 MG tablet Take 1 tablet (600 mg total) by mouth every 6 (six) hours as needed. 06/23/21   Horton, Barbette Hair, MD  metFORMIN (GLUCOPHAGE) 500 MG tablet Take by mouth. 09/29/16   [provider]  ondansetron (ZOFRAN ODT) 4 MG disintegrating tablet 4mg  ODT q4 hours prn nausea/vomit 10/19/20   Deno Etienne, DO  oxyCODONE-acetaminophen (PERCOCET) 5-325 MG tablet Take 1 tablet by mouth every 4 (four) hours as needed for moderate pain. [id number]   Delora Fuel, MD  potassium  chloride SA (KLOR-CON) 20 MEQ tablet Take 1 tablet (20 mEq total) by mouth daily. 10/22/20   Molpus, John, MD  atorvastatin (LIPITOR) 10 MG tablet Take 10 mg by mouth daily.  07/14/19  [provider]  furosemide (LASIX) 20 MG tablet Take 1 tablet (20 mg total) by mouth daily. 01/09/18 07/14/19  Recardo Evangelist, PA-C  glipiZIDE (GLUCOTROL) 5 MG tablet Take 10 mg by mouth daily before breakfast.  07/14/19  [provider]  lisinopril (PRINIVIL,ZESTRIL) 5 MG tablet Take 5 mg by mouth daily.  07/14/19  [provider]      Allergies    Patient has no known allergies.    Review of Systems   Review of Systems  Constitutional:  Negative for fever.  HENT:  Negative for sore throat.   Eyes:  Negative for visual disturbance.  Respiratory:  Negative for shortness of breath.   Cardiovascular:  Negative for chest pain.  Gastrointestinal:  Positive for abdominal pain and diarrhea. Negative for hematemesis, hematochezia and vomiting.  Genitourinary:  Negative for dysuria.  Musculoskeletal:  Negative for back pain.  Skin:  Negative for rash.  Neurological:  Negative for headaches.   Physical Exam Updated Vital Signs BP 118/69 (BP Location: Right Arm)    Pulse 63    Temp 98.4 F (36.9 C) (Oral)    Resp 18    Ht 5\' 4"  (1.626 m)    Wt 76.2 kg    LMP  (LMP Unknown)    SpO2  100%    BMI 28.84 kg/m  Physical Exam Vitals and nursing note reviewed.  Constitutional:      General: She is not in acute distress.    Appearance: Normal appearance. She is well-developed.  HENT:     Head: Normocephalic and atraumatic.  Eyes:     Conjunctiva/sclera: Conjunctivae normal.  Cardiovascular:     Rate and Rhythm: Normal rate and regular rhythm.     Heart sounds: No murmur heard. Pulmonary:     Effort: Pulmonary effort is normal. No respiratory distress.     Breath sounds: Normal breath sounds.  Abdominal:     Palpations: Abdomen is soft.     Tenderness: There is generalized abdominal  tenderness. There is no guarding or rebound.  Musculoskeletal:        General: No swelling.     Cervical back: Neck supple.  Skin:    General: Skin is warm and dry.     Capillary Refill: Capillary refill takes less than 2 seconds.  Neurological:     General: No focal deficit present.     Mental Status: She is alert.  Psychiatric:        Mood and Affect: Mood normal.    ED Results / Procedures / Treatments   Labs (all labs ordered are listed, but only abnormal results are displayed) Labs Reviewed  CBC - Abnormal; Notable for the following components:      Result Value   WBC 11.4 (*)    All other components within normal limits  URINALYSIS, ROUTINE W REFLEX MICROSCOPIC - Abnormal; Notable for the following components:   Hgb urine dipstick TRACE (*)    All other components within normal limits  URINALYSIS, MICROSCOPIC (REFLEX) - Abnormal; Notable for the following components:   Bacteria, UA FEW (*)    All other components within normal limits  LIPASE, BLOOD  COMPREHENSIVE METABOLIC PANEL    EKG None  Radiology CT Abdomen Pelvis W Contrast  Result Date: 08/22/2021 CLINICAL DATA:  Pain left lower quadrant EXAM: CT ABDOMEN AND PELVIS WITH CONTRAST TECHNIQUE: Multidetector CT imaging of the abdomen and pelvis was performed using the standard protocol following bolus administration of intravenous contrast. RADIATION DOSE REDUCTION: This exam was performed according to the departmental dose-optimization program which includes automated exposure control, adjustment of the mA and/or kV according to patient size and/or use of iterative reconstruction technique. CONTRAST:  12mL OMNIPAQUE IOHEXOL 300 MG/ML  SOLN COMPARISON:  06/21/2021 FINDINGS: Lower chest: Unremarkable. Hepatobiliary: Liver measures 16.9 cm. Surgical clips are seen in gallbladder fossa. Pancreas: No focal abnormality is seen. Spleen: Unremarkable. Adrenals/Urinary Tract: Adrenals are unremarkable. There is no  hydronephrosis. There are no renal or ureteral stones. Urinary bladder is unremarkable. Stomach/Bowel: Stomach is not distended. Small bowel loops are not dilated. Appendix is not dilated. Scattered diverticula are seen in the colon. There is no focal wall thickening in colon. There is no pericolic stranding or fluid collection. Vascular/Lymphatic: Unremarkable. Reproductive: Uterus is not seen.  There are no adnexal masses. Other: There is no ascites or pneumoperitoneum. Musculoskeletal: There is minimal anterolisthesis at L4-L5 level. No recent fracture is seen. There is encroachment of neural foramina by bony spurs at L5-S1 level. IMPRESSION: There is no evidence of intestinal obstruction or pneumoperitoneum. Appendix is not dilated. There is no hydronephrosis. Diverticulosis of colon without signs of focal acute diverticulitis. There is minimal anterolisthesis at L4-L5 level. There is encroachment of neural foramina by bony spurs at L5-S1 level. Electronically Signed   By:  Elmer Picker M.D.   On: 08/22/2021 20:35    Procedures Procedures    Medications Ordered in ED Medications  fentaNYL (SUBLIMAZE) injection 50 mcg (50 mcg Intravenous Given 08/22/21 2015)  iohexol (OMNIPAQUE) 300 MG/ML solution 100 mL (100 mLs Intravenous Contrast Given 08/22/21 2020)  ketorolac (TORADOL) 30 MG/ML injection 30 mg (30 mg Intravenous Given 08/22/21 2105)    ED Course/ Medical Decision Making/ A&P Clinical Course as of 08/23/21 [id number]  Fri Aug 22, 2021  2119 Reviewed results of work-up with patient.  She is comfortable plan for discharge home on some antispasmodic and nausea medication.  Recommended follow-up with primary care doctor return instructions discussed [MB]    Clinical Course User Index [MB] Hayden Rasmussen, MD                           Medical Decision Making This patient complains of diarrhea and abdominal pain; this involves an extensive number of treatment Options and is a complaint that  carries with it a high risk of complications and Morbidity. The differential includes gastroenteritis, colitis, diverticulitis, obstruction, dehydration, metabolic derangement  I ordered, reviewed and interpreted labs, which included CBC with mildly elevated white count normal hemoglobin, chemistries and LFTs normal, urinalysis without signs of infection I ordered medication IV pain medication with improvement in her symptoms I ordered imaging studies which included CT abdomen and pelvis and I independently    visualized and interpreted imaging which showed no acute intra-abdominal findings Previous records obtained and reviewed in epic no recent admissions  After the interventions stated above, I reevaluated the patient and found patient symptoms to be improved.  Has had no further diarrhea episodes here.  Reviewed work-up with her.  She is comfortable plan for treatment with medication for diarrhea and abdominal spasms.  Return instructions discussed          Final Clinical Impression(s) / ED Diagnoses Final diagnoses:  Generalized abdominal pain  Diarrhea, unspecified type    Rx / DC Orders ED Discharge Orders          Ordered    ondansetron (ZOFRAN ODT) 4 MG disintegrating tablet        08/22/21 2121    dicyclomine (BENTYL) 20 MG tablet  Every 8 hours PRN        08/22/21 2121              Hayden Rasmussen, MD 08/23/21 [phone]

## 2021-08-22 NOTE — ED Triage Notes (Signed)
Pt arrives ambulatory to ED with reports of abdominal pain since yesterday. Pt reports pain is all lower. Denies any urinary symptoms. States that she has had a lot of diarrhea. Denies NV. Denies blood in stool.

## 2021-09-20 ENCOUNTER — Encounter (HOSPITAL_BASED_OUTPATIENT_CLINIC_OR_DEPARTMENT_OTHER): Payer: Self-pay | Admitting: Emergency Medicine

## 2021-09-20 ENCOUNTER — Emergency Department (HOSPITAL_BASED_OUTPATIENT_CLINIC_OR_DEPARTMENT_OTHER): Payer: 59 | Admitting: Radiology

## 2021-09-20 ENCOUNTER — Emergency Department (HOSPITAL_BASED_OUTPATIENT_CLINIC_OR_DEPARTMENT_OTHER): Payer: 59

## 2021-09-20 ENCOUNTER — Other Ambulatory Visit: Payer: Self-pay

## 2021-09-20 ENCOUNTER — Emergency Department (HOSPITAL_BASED_OUTPATIENT_CLINIC_OR_DEPARTMENT_OTHER)
Admission: EM | Admit: 2021-09-20 | Discharge: 2021-09-20 | Disposition: A | Discharge status: Home / Self Care | Payer: 59 | Attending: Emergency Medicine | Admitting: Emergency Medicine

## 2021-09-20 DIAGNOSIS — I10 Essential (primary) hypertension: Secondary | ICD-10-CM | POA: Diagnosis not present

## 2021-09-20 DIAGNOSIS — E119 Type 2 diabetes mellitus without complications: Secondary | ICD-10-CM | POA: Diagnosis not present

## 2021-09-20 DIAGNOSIS — R0789 Other chest pain: Secondary | ICD-10-CM | POA: Diagnosis present

## 2021-09-20 DIAGNOSIS — R519 Headache, unspecified: Secondary | ICD-10-CM | POA: Diagnosis not present

## 2021-09-20 DIAGNOSIS — Z79899 Other long term (current) drug therapy: Secondary | ICD-10-CM | POA: Diagnosis not present

## 2021-09-20 DIAGNOSIS — R42 Dizziness and giddiness: Secondary | ICD-10-CM | POA: Diagnosis not present

## 2021-09-20 DIAGNOSIS — Z7984 Long term (current) use of oral hypoglycemic drugs: Secondary | ICD-10-CM | POA: Diagnosis not present

## 2021-09-20 LAB — BASIC METABOLIC PANEL
Anion gap: 8 (ref 5–15)
BUN: 14 mg/dL (ref 6–20)
CO2: 27 mmol/L (ref 22–32)
Calcium: 9.3 mg/dL (ref 8.9–10.3)
Chloride: 104 mmol/L (ref 98–111)
Creatinine, Ser: 0.67 mg/dL (ref 0.44–1.00)
GFR, Estimated: >60 mL/min (ref >60)
Glucose, Bld: 117 mg/dL — ABNORMAL HIGH (ref 70–99)
Potassium: 3.5 mmol/L (ref 3.5–5.1)
Sodium: 139 mmol/L (ref 135–145)

## 2021-09-20 LAB — CBC
HCT: 41.9 % (ref 36.0–46.0)
Hemoglobin: 13.8 g/dL (ref 12.0–15.0)
MCH: 31.6 pg (ref 26.0–34.0)
MCHC: 32.9 g/dL (ref 30.0–36.0)
MCV: 95.9 fL (ref 80.0–100.0)
Platelets: 209 10*3/uL (ref 150–400)
RBC: 4.37 MIL/uL (ref 3.87–5.11)
RDW: 12.6 % (ref 11.5–15.5)
WBC: 5.7 10*3/uL (ref 4.0–10.5)
nRBC: 0 % (ref 0.0–0.2)

## 2021-09-20 LAB — TROPONIN I (HIGH SENSITIVITY)
Troponin I (High Sensitivity): 3 ng/L (ref <18)
Troponin I (High Sensitivity): 3 ng/L (ref <18)

## 2021-09-20 MED ORDER — SODIUM CHLORIDE 0.9 % IV BOLUS
1000.0000 mL | Freq: Once | INTRAVENOUS | Status: AC
  Administered 2021-09-20: 1000 mL via INTRAVENOUS

## 2021-09-20 MED ORDER — PROCHLORPERAZINE EDISYLATE 10 MG/2ML IJ SOLN
10.0000 mg | Freq: Once | INTRAMUSCULAR | Status: AC
  Administered 2021-09-20: 10 mg via INTRAVENOUS
  Filled 2021-09-20: qty 2

## 2021-09-20 MED ORDER — DIPHENHYDRAMINE HCL 50 MG/ML IJ SOLN
25.0000 mg | Freq: Once | INTRAMUSCULAR | Status: AC
  Administered 2021-09-20: 25 mg via INTRAVENOUS
  Filled 2021-09-20: qty 1

## 2021-09-20 NOTE — ED Triage Notes (Signed)
Pt arrives with chest pain starting this am. Pt is diabetic,no smoking,no hypertension. Pt also states she is dizzy

## 2021-09-20 NOTE — ED Provider Notes (Signed)
[facility] EMERGENCY DEPT Provider Note   CSN: [id number] Arrival date & time: 09/20/21  1317     History  Chief Complaint  Patient presents with   Chest Pain    Erin Craig is a 61 y.o. female.  The history is provided by the patient.  Chest Pain Pain location:  L chest Pain quality: aching   Pain radiates to:  Does not radiate Pain severity:  Mild Onset quality:  Gradual Duration:  6 hours Timing:  Constant Progression:  Improving Chronicity:  New Context: raising an arm and at rest   Relieved by:  Nothing Worsened by:  Nothing Associated symptoms: dizziness and headache   Associated symptoms: no abdominal pain, no AICD problem, no altered mental status, no anorexia, no anxiety, no back pain, no claudication, no cough, no diaphoresis, no dysphagia, no fatigue, no fever, no heartburn, no lower extremity edema, no nausea, no near-syncope, no numbness, no orthopnea, no palpitations, no PND, no shortness of breath, no syncope, no vomiting and no weakness   Risk factors: diabetes mellitus, high cholesterol and hypertension       Home Medications Prior to Admission medications   Medication Sig Start Date End Date Taking? Authorizing Provider  cyanocobalamin (,VITAMIN B-12,) 1000 MCG/ML injection INJECT IM EVERY MONTHLY 04/07/16   [provider]  cyclobenzaprine (FLEXERIL) 5 MG tablet Take 2 tablets (10 mg total) by mouth 2 (two) times daily as needed for muscle spasms. 06/23/21   Horton, Mayer Masker, MD  dicyclomine (BENTYL) 20 MG tablet Take 1 tablet (20 mg total) by mouth every 8 (eight) hours as needed for spasms. 08/22/21   Terrilee Files, MD  ibuprofen (ADVIL) 600 MG tablet Take 1 tablet (600 mg total) by mouth every 6 (six) hours as needed. 06/23/21   Horton, Mayer Masker, MD  metFORMIN (GLUCOPHAGE) 500 MG tablet Take by mouth. 09/29/16   [provider]  ondansetron (ZOFRAN ODT) 4 MG disintegrating tablet 4mg  ODT q4 hours prn  nausea/vomit 08/22/21   08/24/21, MD  oxyCODONE-acetaminophen (PERCOCET) 5-325 MG tablet Take 1 tablet by mouth every 4 (four) hours as needed for moderate pain. 06/21/21   13/12/22, MD  potassium chloride SA (KLOR-CON) 20 MEQ tablet Take 1 tablet (20 mEq total) by mouth daily. 10/22/20   Molpus, John, MD  atorvastatin (LIPITOR) 10 MG tablet Take 10 mg by mouth daily.  07/14/19  [provider]  furosemide (LASIX) 20 MG tablet Take 1 tablet (20 mg total) by mouth daily. 01/09/18 07/14/19  14/4/20, PA-C  glipiZIDE (GLUCOTROL) 5 MG tablet Take 10 mg by mouth daily before breakfast.  07/14/19  [provider]  lisinopril (PRINIVIL,ZESTRIL) 5 MG tablet Take 5 mg by mouth daily.  07/14/19  [provider]      Allergies    Patient has no known allergies.    Review of Systems   Review of Systems  Constitutional:  Negative for diaphoresis, fatigue and fever.  HENT:  Negative for trouble swallowing.   Respiratory:  Negative for cough and shortness of breath.   Cardiovascular:  Positive for chest pain. Negative for palpitations, orthopnea, claudication, syncope, PND and near-syncope.  Gastrointestinal:  Negative for abdominal pain, anorexia, heartburn, nausea and vomiting.  Musculoskeletal:  Negative for back pain.  Neurological:  Positive for dizziness and headaches. Negative for weakness and numbness.   Physical Exam Updated Vital Signs BP 132/78    Pulse (!) 51    Temp 99.1 F (37.3  C)    Resp 17    LMP  (LMP Unknown)    SpO2 99%  Physical Exam Vitals and nursing note reviewed.  Constitutional:      General: She is not in acute distress.    Appearance: She is well-developed. She is not ill-appearing.  HENT:     Head: Normocephalic and atraumatic.  Eyes:     Extraocular Movements: Extraocular movements intact.     Conjunctiva/sclera: Conjunctivae normal.     Pupils: Pupils are equal, round, and reactive to light.  Cardiovascular:     Rate and  Rhythm: Normal rate and regular rhythm.     Pulses:          Radial pulses are 2+ on the right side and 2+ on the left side.     Heart sounds: Normal heart sounds. No murmur heard. Pulmonary:     Effort: Pulmonary effort is normal. No respiratory distress.     Breath sounds: Normal breath sounds.  Chest:     Chest wall: Tenderness present.  Abdominal:     Palpations: Abdomen is soft.     Tenderness: There is no abdominal tenderness.  Musculoskeletal:        General: No swelling. Normal range of motion.     Cervical back: Normal range of motion and neck supple.  Skin:    General: Skin is warm and dry.     Capillary Refill: Capillary refill takes less than 2 seconds.  Neurological:     General: No focal deficit present.     Mental Status: She is alert and oriented to person, place, and time.     Cranial Nerves: No cranial nerve deficit.     Motor: No weakness.     Comments: 5/5 strength throughout, normal sensation,no drift, normal speech   Psychiatric:        Mood and Affect: Mood normal.    ED Results / Procedures / Treatments   Labs (all labs ordered are listed, but only abnormal results are displayed) Labs Reviewed  BASIC METABOLIC PANEL - Abnormal; Notable for the following components:      Result Value   Glucose, Bld 117 (*)    All other components within normal limits  CBC  TROPONIN I (HIGH SENSITIVITY)  TROPONIN I (HIGH SENSITIVITY)    EKG EKG Interpretation  Date/Time:  Saturday September 20 2021 13:26:07 EST Ventricular Rate:  60 PR Interval:  130 QRS Duration: 92 QT Interval:  408 QTC Calculation: 408 R Axis:   68 Text Interpretation: Normal sinus rhythm Nonspecific T wave abnormality Abnormal ECG When compared with ECG of 27-Jan-2021 02:05, ST abnormality V2, biphasic TW V1 new Confirmed by Alvira MondaySchlossman, Erin ([id number]) on 09/20/2021 2:25:17 PM  Radiology DG Chest 2 View  Result Date: 09/20/2021 CLINICAL DATA:  Chest pain beginning today. EXAM: CHEST - 2  VIEW COMPARISON:  10/22/2020 FINDINGS: Heart size is normal. Tortuosity of the aorta. The pulmonary vascularity is normal. The lungs are clear. No infiltrate, collapse or effusion. No significant bone finding. IMPRESSION: No active disease.  Chronic aortic tortuosity. Electronically Signed   By: Paulina FusiMark  Shogry M.D.   On: 09/20/2021 15:18   CT Head Wo Contrast  Result Date: 09/20/2021 CLINICAL DATA:  Headache. EXAM: CT HEAD WITHOUT CONTRAST TECHNIQUE: Contiguous axial images were obtained from the base of the skull through the vertex without intravenous contrast. RADIATION DOSE REDUCTION: This exam was performed according to the departmental dose-optimization program which includes automated exposure control, adjustment of the mA  and/or kV according to patient size and/or use of iterative reconstruction technique. COMPARISON:  Head CT 12/27/2018 FINDINGS: Brain: There is no evidence of an acute infarct, intracranial hemorrhage, mass, midline shift, or extra-axial fluid collection. The ventricles and sulci are normal. Vascular: Calcified atherosclerosis at the skull base. No hyperdense vessel. Skull: No fracture or suspicious osseous lesion. Sinuses/Orbits: Mild right ethmoid air cell mucosal thickening. Clear mastoid air cells. Unremarkable included orbits. Other: None. IMPRESSION: Unremarkable CT appearance of the brain. Electronically Signed   By: Sebastian Ache M.D.   On: 09/20/2021 16:32    Procedures Procedures    Medications Ordered in ED Medications  prochlorperazine (COMPAZINE) injection 10 mg (10 mg Intravenous Given 09/20/21 1553)  diphenhydrAMINE (BENADRYL) injection 25 mg (25 mg Intravenous Given 09/20/21 1553)  sodium chloride 0.9 % bolus 1,000 mL (1,000 mLs Intravenous New Bag/Given 09/20/21 1553)    ED Course/ Medical Decision Making/ A&P                           Medical Decision Making Amount and/or Complexity of Data Reviewed Labs: ordered. Radiology: ordered.  Risk Prescription  drug management.   Brinn Basford is here with chest wall pain, body aches, headache.  Overall unremarkable vitals.  EKG per my review and interpretation shows sinus bradycardia.  No ischemic changes.  She has reproducible left-sided chest wall pain on exam.  She is having some episodes of headaches as well recently.  Denies any fever, cough, congestion.  No abdominal pain or shortness of breath.  Headaches are fairly new for her.  She used to be on medicine for high cholesterol, hypertension, diabetes but does not take any medications anymore.  She denies any exertional chest pain.  Chest pain started this afternoon and worse with movement of her arm.  She overall appears uncomfortable when I push on her chest.  She is neurologically intact.  She got normal pulses.  She has had lab work done prior to my evaluation including troponins.  Differential diagnosis includes likely viral process versus musculoskeletal process.  Have lower concern for acute coronary syndrome.  I have no concern for pulmonary embolism as she has no risk factors.  No shortness of breath and no hypoxia or tachycardia.  Does not appear to have any infectious symptoms and unlikely pneumonia.  Since she is having some new headaches we will get a head CT to rule out head bleed.  However have no concern for stroke as she does not have any weakness or numbness.  Could be complex migraine causing most of her symptoms.  In addition I have no concern for dissection.  We will give IV fluids, IV headache cocktail Compazine and Benadryl and reevaluate.  Per my review and interpretation of head CT there is no acute findings.  No head bleed and no head mass.  Radiology report also with no acute findings.  Per my review and interpretation of her labs there are no significant anemia, electrolyte abnormality, kidney injury.  Troponin negative x2.  Overall I have no concern for acute coronary syndrome or other acute cardiac or pulmonary process.  She is  feeling much better after headache cocktail.  Overall suspect possibly migraine leading to the symptoms today.  May be musculoskeletal process as well.  Recommend continued use of Tylenol, ibuprofen at home.  Discharged in good condition.  This chart was dictated using voice recognition software.  Despite best efforts to proofread,  errors can occur  which can change the documentation meaning.         Final Clinical Impression(s) / ED Diagnoses Final diagnoses:  Atypical chest pain  Nonintractable headache, unspecified chronicity pattern, unspecified headache type    Rx / DC Orders ED Discharge Orders     None         Virgina Norfolk, DO 09/20/21 1647

## 2021-09-20 NOTE — Discharge Instructions (Signed)
Follow up with your primary care doctor.

## 2021-09-20 NOTE — ED Notes (Signed)
Patient transported to CT 

## 2021-09-20 NOTE — ED Notes (Signed)
Dc instructions reviewed with pt no questions or concerns at this time. Declined wheelchair and ambulated to lobby without difficulty.

## 2021-10-06 DIAGNOSIS — R002 Palpitations: Secondary | ICD-10-CM | POA: Insufficient documentation

## 2021-10-06 DIAGNOSIS — R200 Anesthesia of skin: Secondary | ICD-10-CM | POA: Insufficient documentation

## 2021-11-19 ENCOUNTER — Ambulatory Visit (INDEPENDENT_AMBULATORY_CARE_PROVIDER_SITE_OTHER): Payer: 59 | Admitting: Podiatry

## 2021-11-19 DIAGNOSIS — Q666 Other congenital valgus deformities of feet: Secondary | ICD-10-CM | POA: Diagnosis not present

## 2021-11-19 DIAGNOSIS — Q828 Other specified congenital malformations of skin: Secondary | ICD-10-CM

## 2021-11-25 ENCOUNTER — Encounter: Payer: Self-pay | Admitting: Podiatry

## 2021-11-25 NOTE — Progress Notes (Signed)
?Subjective:  ?Patient ID: Erin Craig, female    DOB: March 26, 1961,  MRN: [id number] ? ?Chief Complaint  ?Patient presents with  ? Toe Pain  ?  Left foot 5th toe corn   ? ? ?61 y.o. female presents with the above complaint.  Patient presents with complaint left fifth lateral digit corn.  Patient states painful to touch is progressive gotten worse.  Hyperkeratotic lesion/porokeratotic lesion.  Pain with shoes.  She has not tried shoe gear modification she has not seen MRIs prior to seeing me.  Pain is 5 out of 10 dull aching nature.  She has secondary complaint of flatfoot.  She would like to discuss orthotics.  She has not seen MRIs prior to seeing me for this.  She denies any other acute complaints. ? ? ?Review of Systems: Negative except as noted in the HPI. Denies N/V/F/Ch. ? ?Past Medical History:  ?Diagnosis Date  ? Bradycardia   ? Diabetes mellitus without complication (HCC)   ? Heart murmur   ? Hypercholesterolemia   ? Hypertension   ? ? ?Current Outpatient Medications:  ?  cyanocobalamin (,VITAMIN B-12,) 1000 MCG/ML injection, INJECT IM EVERY MONTHLY, Disp: , Rfl:  ?  cyclobenzaprine (FLEXERIL) 5 MG tablet, Take 2 tablets (10 mg total) by mouth 2 (two) times daily as needed for muscle spasms., Disp: 10 tablet, Rfl: 0 ?  dicyclomine (BENTYL) 20 MG tablet, Take 1 tablet (20 mg total) by mouth every 8 (eight) hours as needed for spasms., Disp: 20 tablet, Rfl: 0 ?  ibuprofen (ADVIL) 600 MG tablet, Take 1 tablet (600 mg total) by mouth every 6 (six) hours as needed., Disp: 30 tablet, Rfl: 0 ?  metFORMIN (GLUCOPHAGE) 500 MG tablet, Take by mouth., Disp: , Rfl:  ?  ondansetron (ZOFRAN ODT) 4 MG disintegrating tablet, 4mg  ODT q4 hours prn nausea/vomit, Disp: 20 tablet, Rfl: 0 ?  oxyCODONE-acetaminophen (PERCOCET) 5-325 MG tablet, Take 1 tablet by mouth every 4 (four) hours as needed for moderate pain., Disp: 15 tablet, Rfl: 0 ?  potassium chloride SA (KLOR-CON) 20 MEQ tablet, Take 1 tablet (20 mEq total) by  mouth daily., Disp: 3 tablet, Rfl: 0 ? ?Social History  ? ?Tobacco Use  ?Smoking Status Never  ?Smokeless Tobacco Never  ? ? ?No Known Allergies ?Objective:  ?There were no vitals filed for this visit. ?There is no height or weight on file to calculate BMI. ?Constitutional Well developed. ?Well nourished.  ?Vascular Dorsalis pedis pulses palpable bilaterally. ?Posterior tibial pulses palpable bilaterally. ?Capillary refill normal to all digits.  ?No cyanosis or clubbing noted. ?Pedal hair growth normal.  ?Neurologic Normal speech. ?Oriented to person, place, and time. ?Epicritic sensation to light touch grossly present bilaterally.  ?Dermatologic Nails well groomed and normal in appearance. ?No open wounds. ?No skin lesions.  ?Orthopedic: Hammertoe contracture left fifth digit noted with underlying porokeratotic lesion/corn formation noted to the lateral side of the toe.  Pain on palpation to the corn.  Gait examination shows pes planovalgus foot structure with calcaneovalgus to many toe signs partially able to recruit the arch with dorsiflexion of the hallux unable to perform single and double heel rise  ? ?Radiographs: None ?Assessment:  ? ?1. Pes planovalgus   ?2. Porokeratosis   ? ?Plan:  ?Patient was evaluated and treated and all questions answered. ? ?Left fifth digit hammertoe/corn ?-All questions and concerns were discussed with the patient in extensive detail I discussed shoe gear modification in extensive detail.  I dispensed toe protector to take  some pressure off of it.  She states understanding ? ?Pes planovalgus ?-I explained to patient the etiology of pes planovalgus and relationship with arch and heel pain and various treatment options were discussed.  Given patient foot structure in the setting of arch and heel pain I believe patient will benefit from custom-made orthotics to help control the hindfoot motion support the arch of the foot and take the stress away from arch patient agrees with the plan  like to proceed with orthotics ?-Patient was casted for orthotics ? ? ?No follow-ups on file.  ?

## 2021-12-01 ENCOUNTER — Encounter (HOSPITAL_BASED_OUTPATIENT_CLINIC_OR_DEPARTMENT_OTHER): Payer: Self-pay | Admitting: Obstetrics and Gynecology

## 2021-12-01 ENCOUNTER — Emergency Department (HOSPITAL_BASED_OUTPATIENT_CLINIC_OR_DEPARTMENT_OTHER)
Admission: EM | Admit: 2021-12-01 | Discharge: 2021-12-01 | Disposition: A | Discharge status: Home / Self Care | Payer: 59 | Attending: Emergency Medicine | Admitting: Emergency Medicine

## 2021-12-01 ENCOUNTER — Other Ambulatory Visit: Payer: Self-pay

## 2021-12-01 DIAGNOSIS — I10 Essential (primary) hypertension: Secondary | ICD-10-CM | POA: Insufficient documentation

## 2021-12-01 DIAGNOSIS — R6883 Chills (without fever): Secondary | ICD-10-CM | POA: Diagnosis not present

## 2021-12-01 DIAGNOSIS — R112 Nausea with vomiting, unspecified: Secondary | ICD-10-CM | POA: Diagnosis not present

## 2021-12-01 DIAGNOSIS — R5383 Other fatigue: Secondary | ICD-10-CM | POA: Insufficient documentation

## 2021-12-01 DIAGNOSIS — E119 Type 2 diabetes mellitus without complications: Secondary | ICD-10-CM | POA: Diagnosis not present

## 2021-12-01 DIAGNOSIS — Z79899 Other long term (current) drug therapy: Secondary | ICD-10-CM | POA: Insufficient documentation

## 2021-12-01 DIAGNOSIS — R197 Diarrhea, unspecified: Secondary | ICD-10-CM | POA: Diagnosis not present

## 2021-12-01 DIAGNOSIS — R1084 Generalized abdominal pain: Secondary | ICD-10-CM | POA: Insufficient documentation

## 2021-12-01 DIAGNOSIS — Z7984 Long term (current) use of oral hypoglycemic drugs: Secondary | ICD-10-CM | POA: Diagnosis not present

## 2021-12-01 DIAGNOSIS — R109 Unspecified abdominal pain: Secondary | ICD-10-CM

## 2021-12-01 LAB — LIPASE, BLOOD: Lipase: 21 U/L (ref 11–51)

## 2021-12-01 LAB — CBC
HCT: 45.2 % (ref 36.0–46.0)
Hemoglobin: 14.6 g/dL (ref 12.0–15.0)
MCH: 31.1 pg (ref 26.0–34.0)
MCHC: 32.3 g/dL (ref 30.0–36.0)
MCV: 96.4 fL (ref 80.0–100.0)
Platelets: 182 10*3/uL (ref 150–400)
RBC: 4.69 MIL/uL (ref 3.87–5.11)
RDW: 13.2 % (ref 11.5–15.5)
WBC: 3.2 10*3/uL — ABNORMAL LOW (ref 4.0–10.5)
nRBC: 0 % (ref 0.0–0.2)

## 2021-12-01 LAB — URINALYSIS, ROUTINE W REFLEX MICROSCOPIC
Bilirubin Urine: NEGATIVE
Glucose, UA: NEGATIVE mg/dL
Ketones, ur: 15 mg/dL — AB
Nitrite: NEGATIVE
Protein, ur: 30 mg/dL — AB
Specific Gravity, Urine: 1.034 — ABNORMAL HIGH (ref 1.005–1.030)
pH: 5.5 (ref 5.0–8.0)

## 2021-12-01 LAB — COMPREHENSIVE METABOLIC PANEL
ALT: 12 U/L (ref 0–44)
AST: 16 U/L (ref 15–41)
Albumin: 4 g/dL (ref 3.5–5.0)
Alkaline Phosphatase: 40 U/L (ref 38–126)
Anion gap: 10 (ref 5–15)
BUN: 13 mg/dL (ref 6–20)
CO2: 22 mmol/L (ref 22–32)
Calcium: 9 mg/dL (ref 8.9–10.3)
Chloride: 104 mmol/L (ref 98–111)
Creatinine, Ser: 0.65 mg/dL (ref 0.44–1.00)
GFR, Estimated: >60 mL/min (ref >60)
Glucose, Bld: 106 mg/dL — ABNORMAL HIGH (ref 70–99)
Potassium: 3.4 mmol/L — ABNORMAL LOW (ref 3.5–5.1)
Sodium: 136 mmol/L (ref 135–145)
Total Bilirubin: 0.7 mg/dL (ref 0.3–1.2)
Total Protein: 6.9 g/dL (ref 6.5–8.1)

## 2021-12-01 LAB — PREGNANCY, URINE: Preg Test, Ur: NEGATIVE

## 2021-12-01 MED ORDER — SODIUM CHLORIDE 0.9 % IV BOLUS
1000.0000 mL | Freq: Once | INTRAVENOUS | Status: AC
  Administered 2021-12-01: 1000 mL via INTRAVENOUS

## 2021-12-01 MED ORDER — ONDANSETRON HCL 4 MG/2ML IJ SOLN
4.0000 mg | Freq: Once | INTRAMUSCULAR | Status: AC
  Administered 2021-12-01: 4 mg via INTRAVENOUS
  Filled 2021-12-01: qty 2

## 2021-12-01 MED ORDER — ONDANSETRON HCL 4 MG PO TABS
4.0000 mg | ORAL_TABLET | Freq: Three times a day (TID) | ORAL | 0 refills | Status: AC | PRN
Start: 2021-12-01 — End: ?

## 2021-12-01 NOTE — ED Provider Notes (Signed)
?[facility]PT ?Provider Note ? ? ?CSN: [id number] ?Arrival date & time: 12/01/21  1715 ? ?  ? ?History ? ?Chief Complaint  ?Patient presents with  ? Abdominal Pain  ? ? ?Erin Craig is a 61 y.o. female. ? ?The history is provided by the patient and medical records. No language interpreter was used.  ?Abdominal Pain ?Pain location:  Generalized ?Pain quality: cramping   ?Pain radiates to:  Does not radiate ?Pain severity:  Moderate ?Onset quality:  Gradual ?Duration:  2 days ?Timing:  Constant ?Progression:  Unchanged ?Chronicity:  New ?Context: sick contacts   ?Context: not eating, not previous surgeries, not recent illness and not trauma   ?Relieved by:  Nothing ?Worsened by:  Nothing ?Ineffective treatments:  None tried ?Associated symptoms: chills, diarrhea, fatigue, nausea and vomiting   ?Associated symptoms: no anorexia, no chest pain, no constipation, no cough, no dysuria, no fever, no flatus, no hematemesis, no hematuria, no melena, no shortness of breath, no vaginal bleeding and no vaginal discharge   ? ?  ? ?Home Medications ?Prior to Admission medications   ?Medication Sig Start Date End Date Taking? Authorizing Provider  ?cyanocobalamin (,VITAMIN B-12,) 1000 MCG/ML injection INJECT 1ML IM EVERY MONTHLY 04/07/16   [provider]  ?cyclobenzaprine (FLEXERIL) 5 MG tablet Take 2 tablets (10 mg total) by mouth 2 (two) times daily as needed for muscle spasms. 06/23/21   Horton, Mayer Maskerourtney F, MD  ?dicyclomine (BENTYL) 20 MG tablet Take 1 tablet (20 mg total) by mouth every 8 (eight) hours as needed for spasms. 08/22/21   Terrilee FilesButler, Michael C, MD  ?ibuprofen (ADVIL) 600 MG tablet Take 1 tablet (600 mg total) by mouth every 6 (six) hours as needed. 06/23/21   Horton, Mayer Maskerourtney F, MD  ?metFORMIN (GLUCOPHAGE) 500 MG tablet Take by mouth. 09/29/16   [provider]  ?ondansetron (ZOFRAN ODT) 4 MG disintegrating tablet 4mg  ODT q4 hours prn nausea/vomit 08/22/21   Terrilee FilesButler, Michael C,  MD  ?oxyCODONE-acetaminophen (PERCOCET) 5-325 MG tablet Take 1 tablet by mouth every 4 (four) hours as needed for moderate pain. 06/21/21   Dione BoozeGlick, David, MD  ?potassium chloride SA (KLOR-CON) 20 MEQ tablet Take 1 tablet (20 mEq total) by mouth daily. 10/22/20   Molpus, John, MD  ?atorvastatin (LIPITOR) 10 MG tablet Take 10 mg by mouth daily.  07/14/19  [provider]  ?furosemide (LASIX) 20 MG tablet Take 1 tablet (20 mg total) by mouth daily. 01/09/18 07/14/19  Bethel BornGekas, Kelly Marie, PA-C  ?glipiZIDE (GLUCOTROL) 5 MG tablet Take 10 mg by mouth daily before breakfast.  07/14/19  [provider]  ?lisinopril (PRINIVIL,ZESTRIL) 5 MG tablet Take 5 mg by mouth daily.  07/14/19  [provider]  ?   ? ?Allergies    ?Patient has no known allergies.   ? ?Review of Systems   ?Review of Systems  ?Constitutional:  Positive for chills and fatigue. Negative for diaphoresis and fever.  ?HENT:  Negative for congestion.   ?Respiratory:  Negative for cough, choking, chest tightness and shortness of breath.   ?Cardiovascular:  Negative for chest pain, palpitations and leg swelling.  ?Gastrointestinal:  Positive for abdominal pain, diarrhea, nausea and vomiting. Negative for abdominal distention, anal bleeding, anorexia, blood in stool, constipation, flatus, hematemesis and melena.  ?Genitourinary:  Negative for difficulty urinating, dysuria, flank pain, frequency, hematuria, vaginal bleeding and vaginal discharge.  ?Musculoskeletal:  Negative for back pain, neck pain and neck stiffness.  ?Skin:  Negative for rash and wound.  ?  Neurological:  Negative for weakness, light-headedness, numbness and headaches.  ?Psychiatric/Behavioral:  Negative for agitation and confusion.   ?All other systems reviewed and are negative. ? ?Physical Exam ?Updated Vital Signs ?BP 107/81 (BP Location: Right Arm)   Pulse 64   Temp 98 ?F (36.7 ?C)   Resp 18   Ht 5\' 4"  (1.626 m)   Wt 70.8 kg   LMP  (LMP Unknown)   SpO2 98%   BMI 26.78  kg/m?  ?Physical Exam ?Vitals and nursing note reviewed.  ?Constitutional:   ?   General: She is not in acute distress. ?   Appearance: She is well-developed. She is not ill-appearing, toxic-appearing or diaphoretic.  ?HENT:  ?   Head: Normocephalic and atraumatic.  ?Eyes:  ?   Extraocular Movements: Extraocular movements intact.  ?   Conjunctiva/sclera: Conjunctivae normal.  ?Cardiovascular:  ?   Rate and Rhythm: Normal rate and regular rhythm.  ?   Heart sounds: No murmur heard. ?Pulmonary:  ?   Effort: Pulmonary effort is normal. No respiratory distress.  ?   Breath sounds: Normal breath sounds. No wheezing, rhonchi or rales.  ?Chest:  ?   Chest wall: No tenderness.  ?Abdominal:  ?   General: Abdomen is flat. Bowel sounds are normal. There is no distension.  ?   Palpations: Abdomen is soft.  ?   Tenderness: There is no abdominal tenderness. There is no right CVA tenderness, left CVA tenderness, guarding or rebound.  ?Musculoskeletal:     ?   General: No swelling.  ?   Cervical back: Neck supple.  ?Skin: ?   General: Skin is warm and dry.  ?   Capillary Refill: Capillary refill takes less than 2 seconds.  ?   Coloration: Skin is not pale.  ?   Findings: No rash.  ?Neurological:  ?   General: No focal deficit present.  ?   Mental Status: She is alert.  ?Psychiatric:     ?   Mood and Affect: Mood normal.  ? ? ?ED Results / Procedures / Treatments   ?Labs ?(all labs ordered are listed, but only abnormal results are displayed) ?Labs Reviewed  ?COMPREHENSIVE METABOLIC PANEL - Abnormal; Notable for the following components:  ?    Result Value  ? Potassium 3.4 (*)   ? Glucose, Bld 106 (*)   ? All other components within normal limits  ?CBC - Abnormal; Notable for the following components:  ? WBC 3.2 (*)   ? All other components within normal limits  ?URINALYSIS, ROUTINE W REFLEX MICROSCOPIC - Abnormal; Notable for the following components:  ? APPearance HAZY (*)   ? Specific Gravity, Urine 1.034 (*)   ? Hgb urine  dipstick LARGE (*)   ? Ketones, ur 15 (*)   ? Protein, ur 30 (*)   ? Leukocytes,Ua MODERATE (*)   ? Non Squamous Epithelial 0-5 (*)   ? All other components within normal limits  ?LIPASE, BLOOD  ?PREGNANCY, URINE  ? ? ?EKG ?None ? ?Radiology ?No results found. ? ?Procedures ?Procedures  ? ? ?Medications Ordered in ED ?Medications  ?sodium chloride 0.9 % bolus 1,000 mL (0 mLs Intravenous Stopped 12/01/21 1949)  ?ondansetron (ZOFRAN) injection 4 mg (4 mg Intravenous Given 12/01/21 1844)  ? ? ?ED Course/ Medical Decision Making/ A&P ?  ?                        ?Medical Decision Making ?Amount and/or Complexity  of Data Reviewed ?Labs: ordered. ? ?Risk ?Prescription drug management. ? ? ? ?Yarelin Reichardt is a 61 y.o. female with a past medical history significant for previous hysterectomy, previous cholecystectomy, hypertension, hypercholesterolemia, diabetes, and known heart murmur who presents with nausea, vomiting, diarrhea, and abdominal cramping since yesterday.  According patient, yesterday morning before him she woke up with nausea, vomiting, diarrhea.  She reports that after her GI symptoms she started having some abdominal cramping diffusely.  She reports she continues to have nausea and vomiting with any p.o. intake.  She does report that grandchild had some nausea and vomiting recently.  Patient denies any fevers or chills.  Denies any constipation.  Denies any urinary changes such as hematuria, dysuria, frequency, or decrease.  Denies any chest pain shortness of breath or palpitations.  Denies any back or flank pain.  Reports some abdominal cramping that is moderate in severity.  It waxes and wanes.  She denies any trauma.  Denies skin changes.  Denies any other complaints.  She has not take any medicine to help her symptoms. ? ?On exam, lungs clear chest nontender.  Abdomen is nontender on my exam with bowel sounds that sounded normal.  Flanks and back nontender.  Good pulses in extremities.  Patient resting  but still nauseous.  Vital signs reassuring on arrival without significant hypotension, tachycardia, tachypnea, or fever. ? ?Clinically I suspect patient has gastroenteritis that has been sweeping through the commun

## 2021-12-01 NOTE — ED Notes (Signed)
Patient provided with water for PO challenge per EDP ?

## 2021-12-01 NOTE — Discharge Instructions (Signed)
Your history, exam, work-up today are consistent with a likely viral gastroenteritis causing nausea with vomiting, diarrhea, and abdominal cramping given the amount of it in the community at this time.  Please use the nausea medicine to help maintain hydration and rest.  Please wash your hands.  Please follow-up with your primary doctor and rest and stay hydrated.  If any symptoms change or worsen acutely, please return to the nearest emergency department. ?

## 2021-12-01 NOTE — ED Triage Notes (Signed)
Patient reports to the ER for emesis since Sunday morning at 0400. Patient reports it woke her from her sleep. Patient reports she at something around 2pm today and threw it up before 4pm.  ?

## 2021-12-10 ENCOUNTER — Telehealth: Payer: Self-pay

## 2021-12-10 NOTE — Telephone Encounter (Signed)
Spoke with patient and she has not bought new shoes yet, she is refusing appointment at this time.

## 2021-12-13 ENCOUNTER — Observation Stay (HOSPITAL_BASED_OUTPATIENT_CLINIC_OR_DEPARTMENT_OTHER)
Admission: EM | Admit: 2021-12-13 | Discharge: 2021-12-14 | Disposition: A | Discharge status: Home / Self Care | Payer: 59 | Attending: Emergency Medicine | Admitting: Emergency Medicine

## 2021-12-13 ENCOUNTER — Other Ambulatory Visit: Payer: Self-pay

## 2021-12-13 ENCOUNTER — Encounter (HOSPITAL_BASED_OUTPATIENT_CLINIC_OR_DEPARTMENT_OTHER): Payer: Self-pay | Admitting: Emergency Medicine

## 2021-12-13 ENCOUNTER — Emergency Department (HOSPITAL_BASED_OUTPATIENT_CLINIC_OR_DEPARTMENT_OTHER): Payer: 59

## 2021-12-13 DIAGNOSIS — R202 Paresthesia of skin: Secondary | ICD-10-CM | POA: Insufficient documentation

## 2021-12-13 DIAGNOSIS — Z79899 Other long term (current) drug therapy: Secondary | ICD-10-CM | POA: Diagnosis not present

## 2021-12-13 DIAGNOSIS — I10 Essential (primary) hypertension: Secondary | ICD-10-CM | POA: Diagnosis not present

## 2021-12-13 DIAGNOSIS — R531 Weakness: Secondary | ICD-10-CM | POA: Diagnosis not present

## 2021-12-13 DIAGNOSIS — E1165 Type 2 diabetes mellitus with hyperglycemia: Secondary | ICD-10-CM | POA: Diagnosis not present

## 2021-12-13 DIAGNOSIS — Z7984 Long term (current) use of oral hypoglycemic drugs: Secondary | ICD-10-CM | POA: Diagnosis not present

## 2021-12-13 DIAGNOSIS — R079 Chest pain, unspecified: Secondary | ICD-10-CM | POA: Diagnosis not present

## 2021-12-13 DIAGNOSIS — R29898 Other symptoms and signs involving the musculoskeletal system: Secondary | ICD-10-CM

## 2021-12-13 LAB — CBC
HCT: 42.8 % (ref 36.0–46.0)
Hemoglobin: 13.8 g/dL (ref 12.0–15.0)
MCH: 31.7 pg (ref 26.0–34.0)
MCHC: 32.2 g/dL (ref 30.0–36.0)
MCV: 98.2 fL (ref 80.0–100.0)
Platelets: 243 10*3/uL (ref 150–400)
RBC: 4.36 MIL/uL (ref 3.87–5.11)
RDW: 13.1 % (ref 11.5–15.5)
WBC: 6.5 10*3/uL (ref 4.0–10.5)
nRBC: 0 % (ref 0.0–0.2)

## 2021-12-13 LAB — DIFFERENTIAL
Abs Immature Granulocytes: 0.01 10*3/uL (ref 0.00–0.07)
Basophils Absolute: 0 10*3/uL (ref 0.0–0.1)
Basophils Relative: 1 %
Eosinophils Absolute: 0.1 10*3/uL (ref 0.0–0.5)
Eosinophils Relative: 2 %
Immature Granulocytes: 0 %
Lymphocytes Relative: 39 %
Lymphs Abs: 2.6 10*3/uL (ref 0.7–4.0)
Monocytes Absolute: 0.6 10*3/uL (ref 0.1–1.0)
Monocytes Relative: 8 %
Neutro Abs: 3.3 10*3/uL (ref 1.7–7.7)
Neutrophils Relative %: 50 %

## 2021-12-13 LAB — URINALYSIS, ROUTINE W REFLEX MICROSCOPIC
Bilirubin Urine: NEGATIVE
Glucose, UA: NEGATIVE mg/dL
Hgb urine dipstick: NEGATIVE
Ketones, ur: NEGATIVE mg/dL
Nitrite: NEGATIVE
Protein, ur: NEGATIVE mg/dL
Specific Gravity, Urine: 1.015 (ref 1.005–1.030)
pH: 7 (ref 5.0–8.0)

## 2021-12-13 LAB — BASIC METABOLIC PANEL
Anion gap: 6 (ref 5–15)
BUN: 11 mg/dL (ref 6–20)
CO2: 27 mmol/L (ref 22–32)
Calcium: 8.8 mg/dL — ABNORMAL LOW (ref 8.9–10.3)
Chloride: 105 mmol/L (ref 98–111)
Creatinine, Ser: 0.68 mg/dL (ref 0.44–1.00)
GFR, Estimated: >60 mL/min (ref >60)
Glucose, Bld: 207 mg/dL — ABNORMAL HIGH (ref 70–99)
Potassium: 4.1 mmol/L (ref 3.5–5.1)
Sodium: 138 mmol/L (ref 135–145)

## 2021-12-13 LAB — RAPID URINE DRUG SCREEN, HOSP PERFORMED
Amphetamines: NOT DETECTED
Barbiturates: NOT DETECTED
Benzodiazepines: NOT DETECTED
Cocaine: NOT DETECTED
Opiates: NOT DETECTED
Tetrahydrocannabinol: NOT DETECTED

## 2021-12-13 LAB — URINALYSIS, MICROSCOPIC (REFLEX)

## 2021-12-13 LAB — PROTIME-INR
INR: 1 (ref 0.8–1.2)
Prothrombin Time: 12.7 seconds (ref 11.4–15.2)

## 2021-12-13 LAB — APTT: aPTT: 29 seconds (ref 24–36)

## 2021-12-13 LAB — TROPONIN I (HIGH SENSITIVITY)
Troponin I (High Sensitivity): 2 ng/L (ref <18)
Troponin I (High Sensitivity): 3 ng/L (ref <18)

## 2021-12-13 LAB — D-DIMER, QUANTITATIVE: D-Dimer, Quant: 0.42 ug/mL-FEU (ref 0.00–0.50)

## 2021-12-13 NOTE — ED Notes (Signed)
BP on left and right arm no difference ?

## 2021-12-13 NOTE — ED Notes (Signed)
ED Provider at bedside. 

## 2021-12-13 NOTE — Progress Notes (Signed)
Plan of Care Note for accepted transfer ? ? ?Patient: Erin Craig MRN: [id number]   [place]: 12/13/2021 ? ?Facility requesting transfer: [facility] ?Requesting Provider: Sherrell Puller, PA ?Reason for transfer: [place] ?Facility course: ? ?61yo F with bradycardia, T2DM, GERD, who presents with chest pain radiating to left arm.  ?Chest pain reproducible on exam with negative troponin. EKG with SR and non specific Twave changes to inferiorlateral leads.D-dimer negative. CXR negative. ?However she also reports weakness to LUE for past 2-3 days with paresthesia. Weakness is noticable on exam. CT head is negative. 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 ?ED PA concern about stroke and wants hospitalist admit for stroke rule out.  ?Plan of care: ?The patient is accepted for admission to Telemetry unit, at [facility]  ? ?ED provider to continue care of patient while remaining in the ED. ? ?Author: Orene Desanctis, DO ?12/13/2021 ? ?Check [email or web address] for on-call coverage. ? ?Nursing staff, Please call [place] number on Amion as soon as patient's arrival, so appropriate admitting provider can evaluate the pt. ?

## 2021-12-13 NOTE — ED Notes (Signed)
Pt encouraged to urinate

## 2021-12-13 NOTE — ED Provider Notes (Signed)
?[facility] EMERGENCY DEPARTMENT ?Provider Note ? ? ?CSN: [id number] ?Arrival date & time: 12/13/21  1321 ? ?  ? ?History ?Chief Complaint  ?Patient presents with  ? Chest Pain  ? ? ?Erin Craig is a 61 y.o. female bradycardia, diabetes, hypertension, HLD presents emergency department for evaluation of left-sided chest pain radiating into her left arm for the past 3 days and gradually worsening today.  Additionally, she mentions having some left upper extremity weakness in the past 2 to 3 days as well with numbness and tingling.  She denies any shortness of breath, fever, cough, cold symptoms, abdominal pain, nausea, vomiting, headache, or blurry vision.  She denies any trouble speaking or any trouble with her walking.  She denies any history of previous MI or stroke.  Denies any trauma to her chest.  No known drug allergies.  Denies any tobacco, EtOH, or illicit drug use ever. ? ? ?Chest Pain ?Associated symptoms: numbness and weakness   ?Associated symptoms: no abdominal pain, no back pain, no cough, no dizziness, no fever, no headache, no nausea, no palpitations, no shortness of breath and no vomiting   ? ?  ? ?Home Medications ?Prior to Admission medications   ?Medication Sig Start Date End Date Taking? Authorizing Provider  ?cyanocobalamin (,VITAMIN B-12,) 1000 MCG/ML injection INJECT IM EVERY MONTHLY 04/07/16   [provider]  ?cyclobenzaprine (FLEXERIL) 5 MG tablet Take 2 tablets (10 mg total) by mouth 2 (two) times daily as needed for muscle spasms. 06/23/21   Horton, Mayer Masker, MD  ?dicyclomine (BENTYL) 20 MG tablet Take 1 tablet (20 mg total) by mouth every 8 (eight) hours as needed for spasms. 08/22/21   Terrilee Files, MD  ?ibuprofen (ADVIL) 600 MG tablet Take 1 tablet (600 mg total) by mouth every 6 (six) hours as needed. 06/23/21   Horton, Mayer Masker, MD  ?metFORMIN (GLUCOPHAGE) 500 MG tablet Take by mouth. 09/29/16   [provider]  ?ondansetron (ZOFRAN ODT) 4 MG  disintegrating tablet 4mg  ODT q4 hours prn nausea/vomit 08/22/21   08/24/21, MD  ?ondansetron (ZOFRAN) 4 MG tablet Take 1 tablet (4 mg total) by mouth every 8 (eight) hours as needed for nausea or vomiting. 12/01/21   Tegeler, 12/03/21, MD  ?oxyCODONE-acetaminophen (PERCOCET) 5-325 MG tablet Take 1 tablet by mouth every 4 (four) hours as needed for moderate pain. 06/21/21   13/12/22, MD  ?potassium chloride SA (KLOR-CON) 20 MEQ tablet Take 1 tablet (20 mEq total) by mouth daily. 10/22/20   Molpus, John, MD  ?atorvastatin (LIPITOR) 10 MG tablet Take 10 mg by mouth daily.  07/14/19  [provider]  ?furosemide (LASIX) 20 MG tablet Take 1 tablet (20 mg total) by mouth daily. 01/09/18 07/14/19  14/4/20, PA-C  ?glipiZIDE (GLUCOTROL) 5 MG tablet Take 10 mg by mouth daily before breakfast.  07/14/19  [provider]  ?lisinopril (PRINIVIL,ZESTRIL) 5 MG tablet Take 5 mg by mouth daily.  07/14/19  [provider]  ?   ? ?Allergies    ?Patient has no known allergies.   ? ?Review of Systems   ?Review of Systems  ?Constitutional:  Negative for chills and fever.  ?Eyes:  Negative for visual disturbance.  ?Respiratory:  Negative for cough and shortness of breath.   ?Cardiovascular:  Positive for chest pain. Negative for palpitations and leg swelling.  ?Gastrointestinal:  Negative for abdominal pain, constipation, diarrhea, nausea and vomiting.  ?Musculoskeletal:  Negative for back pain and  neck pain.  ?Neurological:  Positive for weakness and numbness. Negative for dizziness, speech difficulty, light-headedness and headaches.  ? ?Physical Exam ?Updated Vital Signs ?BP 124/71 (BP Location: Left Arm)   Pulse (!) 48   Temp 98.1 ?F (36.7 ?C) (Oral)   Resp 16   Ht  (1.626 m)   Wt 72.6 kg   LMP  (LMP Unknown)   SpO2 99%   BMI 27.46 kg/m?  ?Physical Exam ?Vitals and nursing note reviewed.  ?Constitutional:   ?   General: She is not in acute distress. ?   Appearance: Normal  appearance. She is not ill-appearing or toxic-appearing.  ?HENT:  ?   Head: Normocephalic and atraumatic.  ?Eyes:  ?   General: No visual field deficit or scleral icterus. ?   Extraocular Movements: Extraocular movements intact.  ?   Pupils: Pupils are equal, round, and reactive to light.  ?Cardiovascular:  ?   Rate and Rhythm: Regular rhythm. Bradycardia present.  ?   Pulses:     ?     Radial pulses are 2+ on the right side and 2+ on the left side.  ?     Dorsalis pedis pulses are 2+ on the right side and 2+ on the left side.  ?     Posterior tibial pulses are 2+ on the right side and 2+ on the left side.  ?Pulmonary:  ?   Effort: Pulmonary effort is normal. No respiratory distress.  ?   Breath sounds: Normal breath sounds. No decreased breath sounds.  ?Chest:  ?   Chest wall: Tenderness present.  ?   Comments: Left sided chest tenderness to palpation. No overlying skin changes noted. No crepitus. No increased warmth or swelling noted. No deformity or step offs noted.  ?Abdominal:  ?   General: Abdomen is flat. Bowel sounds are normal.  ?   Palpations: Abdomen is soft.  ?   Tenderness: There is no abdominal tenderness. There is no guarding or rebound.  ?Musculoskeletal:     ?   General: No deformity.  ?   Cervical back: Normal range of motion.  ?   Right lower leg: No tenderness. No edema.  ?   Left lower leg: No tenderness. No edema.  ?Skin: ?   General: Skin is warm and dry.  ?Neurological:  ?   General: No focal deficit present.  ?   Mental Status: She is alert and oriented to person, place, and time. Mental status is at baseline.  ?   GCS: GCS eye subscore is 4. GCS verbal subscore is 5. GCS motor subscore is 6.  ?   Cranial Nerves: Cranial nerves 2-12 are intact. No cranial nerve deficit, dysarthria or facial asymmetry.  ?   Motor: Weakness present.  ?   Coordination: Finger-Nose-Finger Test normal.  ?   Comments: AXO3, GCS 15.  Cranial nerves II through XII intact.  No facial asymmetry.  She is answering  questions appropriate with appropriate speech.  She has decreased sensation in her left upper extremity.  No pronator drift.  She does have some weakness to the left upper extremity with and without resistance. Normal finger to nose with left arm.   ? ? ?ED Results / Procedures / Treatments   ?Labs ?(all labs ordered are listed, but only abnormal results are displayed) ?Labs Reviewed  ?BASIC METABOLIC PANEL - Abnormal; Notable for the following components:  ?    Result Value  ? Glucose, Bld 207 (*)   ?  Calcium 8.8 (*)   ? All other components within normal limits  ?CBC  ?PROTIME-INR  ?APTT  ?DIFFERENTIAL  ?RAPID URINE DRUG SCREEN, HOSP PERFORMED  ?URINALYSIS, ROUTINE W REFLEX MICROSCOPIC  ?D-DIMER, QUANTITATIVE  ?TROPONIN I (HIGH SENSITIVITY)  ?TROPONIN I (HIGH SENSITIVITY)  ? ? ?EKG ?EKG Interpretation ? ?Date/Time:  Saturday Dec 13 2021 13:32:31 EDT ?Ventricular Rate:  61 ?PR Interval:  136 ?QRS Duration: 84 ?QT Interval:  396 ?QTC Calculation: 398 ?R Axis:   73 ?Text Interpretation: Normal sinus rhythm Nonspecific T wave abnormality Abnormal ECG When compared with ECG of 20-Sep-2021 13:26, PREVIOUS ECG IS PRESENT inferior T wave inverions Confirmed by Glynn Octave [phone]) on 12/13/2021 3:35:24 PM ? ?Radiology ?DG Chest 2 View ? ?Result Date: 12/13/2021 ?CLINICAL DATA:  Left-sided chest pain. EXAM: CHEST - 2 VIEW COMPARISON:  09/20/2021 FINDINGS: The heart size is normal. Stable tortuosity of thoracic aorta. Both lungs are clear. The visualized skeletal structures are unremarkable. IMPRESSION: No active cardiopulmonary disease. Electronically Signed   By: Danae Orleans M.D.   On: 12/13/2021 14:09  ? ?CT HEAD WO CONTRAST ? ?Result Date: 12/13/2021 ?CLINICAL DATA:  Neuro deficit, acute, stroke suspected Left hand tingling. EXAM: CT HEAD WITHOUT CONTRAST TECHNIQUE: Contiguous axial images were obtained from the base of the skull through the vertex without intravenous contrast. RADIATION DOSE REDUCTION: This exam was  performed according to the departmental dose-optimization program which includes automated exposure control, adjustment of the mA and/or kV according to patient size and/or use of iterative reconstruction technique. COMPA

## 2021-12-13 NOTE — ED Notes (Signed)
Pt updated on status of bed, given graham crackers and juice ?

## 2021-12-13 NOTE — ED Notes (Signed)
Patient transported to X-ray 

## 2021-12-13 NOTE — ED Triage Notes (Signed)
Left chest pressure started today , left hand tingling . Denies nausea nor shortness of breath . Denies coughing . ?

## 2021-12-13 NOTE — ED Notes (Addendum)
Pt NAD in bed, a/ox4. Pt c/o left arm pain and tingling to fingers x 2 days and then developed 9/10 left sided chest tightness today at 1200. Pt denies SOB, n/v, dizziness or sweatiness. LS clear. Pt does have left arm and left leg weakness with grips/pushes/pulls. -drift ?

## 2021-12-14 ENCOUNTER — Observation Stay (HOSPITAL_COMMUNITY): Payer: 59

## 2021-12-14 ENCOUNTER — Encounter (HOSPITAL_COMMUNITY): Payer: Self-pay | Admitting: Family Medicine

## 2021-12-14 DIAGNOSIS — R531 Weakness: Secondary | ICD-10-CM | POA: Diagnosis not present

## 2021-12-14 DIAGNOSIS — R079 Chest pain, unspecified: Secondary | ICD-10-CM | POA: Diagnosis present

## 2021-12-14 DIAGNOSIS — R0782 Intercostal pain: Secondary | ICD-10-CM | POA: Diagnosis not present

## 2021-12-14 DIAGNOSIS — E1165 Type 2 diabetes mellitus with hyperglycemia: Secondary | ICD-10-CM | POA: Diagnosis not present

## 2021-12-14 LAB — COMPREHENSIVE METABOLIC PANEL
ALT: 18 U/L (ref 0–44)
AST: 18 U/L (ref 15–41)
Albumin: 3.1 g/dL — ABNORMAL LOW (ref 3.5–5.0)
Alkaline Phosphatase: 50 U/L (ref 38–126)
Anion gap: 8 (ref 5–15)
BUN: 9 mg/dL (ref 6–20)
CO2: 24 mmol/L (ref 22–32)
Calcium: 8.6 mg/dL — ABNORMAL LOW (ref 8.9–10.3)
Chloride: 107 mmol/L (ref 98–111)
Creatinine, Ser: 0.63 mg/dL (ref 0.44–1.00)
GFR, Estimated: >60 mL/min (ref >60)
Glucose, Bld: 101 mg/dL — ABNORMAL HIGH (ref 70–99)
Potassium: 3.5 mmol/L (ref 3.5–5.1)
Sodium: 139 mmol/L (ref 135–145)
Total Bilirubin: 0.7 mg/dL (ref 0.3–1.2)
Total Protein: 5.8 g/dL — ABNORMAL LOW (ref 6.5–8.1)

## 2021-12-14 LAB — CBC
HCT: 41.2 % (ref 36.0–46.0)
Hemoglobin: 13.3 g/dL (ref 12.0–15.0)
MCH: 31.4 pg (ref 26.0–34.0)
MCHC: 32.3 g/dL (ref 30.0–36.0)
MCV: 97.4 fL (ref 80.0–100.0)
Platelets: 218 10*3/uL (ref 150–400)
RBC: 4.23 MIL/uL (ref 3.87–5.11)
RDW: 12.9 % (ref 11.5–15.5)
WBC: 7 10*3/uL (ref 4.0–10.5)
nRBC: 0 % (ref 0.0–0.2)

## 2021-12-14 LAB — GLUCOSE, CAPILLARY
Glucose-Capillary: 110 mg/dL — ABNORMAL HIGH (ref 70–99)
Glucose-Capillary: 85 mg/dL (ref 70–99)

## 2021-12-14 LAB — HIV ANTIBODY (ROUTINE TESTING W REFLEX): HIV Screen 4th Generation wRfx: NONREACTIVE

## 2021-12-14 MED ORDER — LORAZEPAM 2 MG/ML IJ SOLN
1.0000 mg | Freq: Once | INTRAMUSCULAR | Status: AC | PRN
  Administered 2021-12-14: 1 mg via INTRAVENOUS
  Filled 2021-12-14: qty 1

## 2021-12-14 MED ORDER — SENNOSIDES-DOCUSATE SODIUM 8.6-50 MG PO TABS: 1.0000 | ORAL_TABLET | Freq: Every evening | ORAL | Status: DC | PRN

## 2021-12-14 MED ORDER — OXYCODONE HCL 5 MG PO TABS
5.0000 mg | ORAL_TABLET | ORAL | Status: DC | PRN
  Administered 2021-12-14: 5 mg via ORAL
  Filled 2021-12-14: qty 1

## 2021-12-14 MED ORDER — INSULIN ASPART 100 UNIT/ML IJ SOLN: 0.0000 [IU] | Freq: Every day | INTRAMUSCULAR | Status: DC

## 2021-12-14 MED ORDER — IBUPROFEN 200 MG PO TABS: 400.0000 mg | ORAL_TABLET | ORAL | Status: DC | PRN

## 2021-12-14 MED ORDER — ONDANSETRON HCL 4 MG/2ML IJ SOLN: 4.0000 mg | Freq: Four times a day (QID) | INTRAMUSCULAR | Status: DC | PRN

## 2021-12-14 MED ORDER — STROKE: EARLY STAGES OF RECOVERY BOOK
Freq: Once | Status: AC
  Filled 2021-12-14: qty 1

## 2021-12-14 MED ORDER — INSULIN ASPART 100 UNIT/ML IJ SOLN: 0.0000 [IU] | Freq: Three times a day (TID) | INTRAMUSCULAR | Status: DC

## 2021-12-14 MED ORDER — TRAMADOL HCL 50 MG PO TABS
50.0000 mg | ORAL_TABLET | Freq: Four times a day (QID) | ORAL | Status: DC | PRN
Start: 2021-12-14 — End: 2021-12-14

## 2021-12-14 MED ORDER — ENOXAPARIN SODIUM 40 MG/0.4ML IJ SOSY
40.0000 mg | PREFILLED_SYRINGE | Freq: Every day | INTRAMUSCULAR | Status: DC
  Administered 2021-12-14: 40 mg via SUBCUTANEOUS
  Filled 2021-12-14: qty 0.4

## 2021-12-14 MED ORDER — ASPIRIN EC 81 MG PO TBEC: 81.0000 mg | DELAYED_RELEASE_TABLET | Freq: Every day | ORAL | 2 refills | Status: AC

## 2021-12-14 MED ORDER — ACETAMINOPHEN 325 MG PO TABS: 650.0000 mg | ORAL_TABLET | Freq: Four times a day (QID) | ORAL | Status: DC | PRN

## 2021-12-14 NOTE — Discharge Summary (Signed)
?Physician Discharge Summary ?  ?Patient: Erin Craig MRN: [id number] DOB: 22-Jul-1961  ?Admit date:     12/13/2021  ?Discharge date: 12/14/21  ?Discharge Physician: Edwin Dada  ? ?PCP: Bernerd Limbo, MD  ? ? ? ?Recommendations at discharge:  ?Follow up with Dr. Chauncey Fischer as planned ?Follow up with Dr. Coletta Memos as needed ?Dr. Coletta Memos: If no other contraindication, please start statin and low dose aspirin given diabetes ? ? ? ? ?Discharge Diagnoses: ?Principal Problem: ?  Left arm weakness ?Active Problems: ?  Chest pain ?  Type 2 diabetes mellitus with hyperglycemia (HCC) ? ?  ? ? ?Hospital Course: ?Erin Craig is a 61 y.o. F with diabetes who presented with recurrent left arm weakness, paresthesia, in the setting of chest pain. ? ?In the ER, CT head normal.  She also underwent work up for chest pain, which was reassuring.   ? ? ? ? ? ?Chest pain ?This was the patient's chief complaint.  Atypical in character, present at rest, worse with palpation or use of left arm.  High-sensitivity troponin normal, D-dimer normal, chest x-ray normal, ECG with nonspecific T wave changes, unchanged from previous. ? ?Monitor on telemetry overnight with sinus bradycardia (which is her baseline), and a few isolated PVCs. ? ?The patient has had similar symptoms periodically for some time now, and is in the midst of an appropriate work-up with her cardiologist.  She had a recent echocardiogram whose report was reviewed in [place] and was normal.  It appears Cardiology has plans for a 4-day Holter and possibly a stress echo which seems appropriate. ? ?At this time, I feel that her exam, imaging, and labs are not consistent with any of the following urgent causes of chest pain, myocardial infarction, aortic dissection, pneumonia, pneumothorax, pulmonary embolism, esophageal rupture. ?- Follow-up with cardiology ? ? ?Left arm weakness and paresthesias ?This is intermittent, also present for some time per the chart.  To me she  feels that this is not a concerning symptom. ? ?Given the persistence of the symptoms, and her cardiovascular risk factors, stroke was considered and MRI brain ordered, which was normal.  I have low suspicion for MS. If this continues to be a troublesome situation, Neurology referral for NCS/EMG could be considered to evaluate more benign causes of neuropathy. ?- Follow up with PCP ? ? ? ? ?  ? ? ? ? ? ?The [facility] Controlled Substances Registry was reviewed for this patient prior to discharge. ? ? ?Procedures performed: MRI brain  ?Disposition: Home ?Diet recommendation:  ?Discharge Diet Orders (From admission, onward)  ? ?  Start     Ordered  ? 12/14/21 0000  Diet - low sodium heart healthy       ? 12/14/21 1334  ? ?  ?  ? ?  ? ? ? ?DISCHARGE MEDICATION: ?Allergies as of 12/14/2021   ?No Known Allergies ?  ? ?  ?Medication List  ?  ? ?TAKE these medications   ? ?cyanocobalamin 1000 MCG/ML injection ?Commonly known as: (VITAMIN B-12) ?Inject 1,000 mcg into the muscle every 30 (thirty) days. ?  ?cyclobenzaprine 5 MG tablet ?Commonly known as: FLEXERIL ?Take 2 tablets (10 mg total) by mouth 2 (two) times daily as needed for muscle spasms. ?  ?dicyclomine 20 MG tablet ?Commonly known as: BENTYL ?Take 1 tablet (20 mg total) by mouth every 8 (eight) hours as needed for spasms. ?  ?ibuprofen 600 MG tablet ?Commonly known as: ADVIL ?Take 1 tablet (600 mg total) by  mouth every 6 (six) hours as needed. ?  ?metFORMIN 500 MG tablet ?Commonly known as: GLUCOPHAGE ?Take by mouth. ?  ?omeprazole 40 MG capsule ?Commonly known as: PRILOSEC ?Take 40 mg by mouth daily. ?  ?ondansetron 4 MG disintegrating tablet ?Commonly known as: Zofran ODT ?4mg  ODT q4 hours prn nausea/vomit ?  ?ondansetron 4 MG tablet ?Commonly known as: ZOFRAN ?Take 1 tablet (4 mg total) by mouth every 8 (eight) hours as needed for nausea or vomiting. ?  ?oxyCODONE-acetaminophen 5-325 MG tablet ?Commonly known as: Percocet ?Take 1 tablet by mouth every 4  (four) hours as needed for moderate pain. ?  ?potassium chloride SA 20 MEQ tablet ?Commonly known as: KLOR-CON M ?Take 1 tablet (20 mEq total) by mouth daily. ?  ? ?  ? ? Follow-up Information   ? ? Marina Goodell, MD Follow up.   ?Specialty: Cardiology ?Contact information: ?[address] ?[address] ?[place] [address] ?[phone] ? ? ?  ?  ? ? Bernerd Limbo, MD Follow up.   ?Specialty: Family Medicine ?Contact information: ?[address] ?[address] ?[facility] ?[place] [address] ?[phone] ? ? ?  ?  ? ?  ?  ? ?  ? ? ?Discharge Instructions   ? ? Diet - low sodium heart healthy   Complete by: As directed ?  ? Discharge instructions   Complete by: As directed ?  ? From Dr. Loleta Books: ?You were admitted for chest pain and left arm weakness. ?With regard to the chest pain, your ECG was unchanged from before, your "high sensitivity cardiac enzymes" were normal, and your chest x-ray was normal.   ?Your d-dimer was also normal. ?Here, we also did an MRI of the brain, which ruled out stroke. ? ?Go see Dr. Chauncey Fischer in follow up ? ?I have sent him and Dr. Coletta Memos your discharge summary  ? Increase activity slowly   Complete by: As directed ?  ? ?  ? ? ?Discharge Exam: ?Erin Craig Weights  ? 12/13/21 1333  ?Weight: 72.6 kg  ? ? ?General: Pt is alert, awake, not in acute distress ?Cardiovascular: RRR, nl S1-S2, no murmurs appreciated.   No LE edema.   There is no mass or skin change to the anterior chest, but there is tenderness to palpation over the left precordium.  ?Respiratory: Normal respiratory rate and rhythm.  CTAB without rales or wheezes. ?Abdominal: Abdomen soft and non-tender.  No distension or HSM.   ?Neuro/Psych: Strength symmetric in upper and lower extremities.  Judgment and insight appear normal. ? ? ?Condition at discharge: good ? ?The results of significant diagnostics from this hospitalization (including imaging, microbiology, ancillary and laboratory) are  listed below for reference.  ? ?Imaging Studies: ?DG Chest 2 View ? ?Result Date: 12/13/2021 ?CLINICAL DATA:  Left-sided chest pain. EXAM: CHEST - 2 VIEW COMPARISON:  09/20/2021 FINDINGS: The heart size is normal. Stable tortuosity of thoracic aorta. Both lungs are clear. The visualized skeletal structures are unremarkable. IMPRESSION: No active cardiopulmonary disease. Electronically Signed   By: Marlaine Hind M.D.   On: 12/13/2021 14:09  ? ?CT HEAD WO CONTRAST ? ?Result Date: 12/13/2021 ?CLINICAL DATA:  Neuro deficit, acute, stroke suspected Left hand tingling. EXAM: CT HEAD WITHOUT CONTRAST TECHNIQUE: Contiguous axial images were obtained from the base of the skull through the vertex without intravenous contrast. RADIATION DOSE REDUCTION: This exam was performed according to the departmental dose-optimization program which includes automated exposure control, adjustment of the mA and/or kV according  to patient size and/or use of iterative reconstruction technique. COMPARISON:  Head CT 09/20/2021 FINDINGS: Brain: Brain volume is normal for age. No intracranial hemorrhage, mass effect, or midline shift. No hydrocephalus. The basilar cisterns are patent. No evidence of territorial infarct or acute ischemia. No extra-axial or intracranial fluid collection. Vascular: Atherosclerosis of skullbase vasculature without hyperdense vessel or abnormal calcification. Skull: No fracture or focal lesion. Sinuses/Orbits: Minimal mucosal thickening of maxillary sinuses and occasional ethmoid air cells. No mastoid effusion. Included orbits are unremarkable. Other: None. IMPRESSION: No acute intracranial abnormality. Electronically Signed   By: Keith Rake M.D.   On: 12/13/2021 16:18  ? ?CT CERVICAL SPINE WO CONTRAST ? ?Result Date: 12/13/2021 ?CLINICAL DATA:  Cervical radiculopathy.  Left hand tingling. EXAM: CT CERVICAL SPINE WITHOUT CONTRAST TECHNIQUE: Multidetector CT imaging of the cervical spine was performed without  intravenous contrast. Multiplanar CT image reconstructions were also generated. RADIATION DOSE REDUCTION: This exam was performed according to the departmental dose-optimization program which includes automated exposure

## 2021-12-14 NOTE — Plan of Care (Signed)
°  Problem: Education: °Goal: Knowledge of disease or condition will improve °Outcome: Progressing °Goal: Knowledge of patient specific risk factors will improve (INDIVIDUALIZE FOR PATIENT) °Outcome: Progressing °  °

## 2021-12-14 NOTE — Progress Notes (Signed)
PT Cancellation Note ? ?Patient Details ?Name: Erin Craig ?MRN: [id number] ?DOB: 1961-03-15 ? ? ?Cancelled Treatment:    Reason Eval/Treat Not Completed: PT screened, no needs identified, will sign off Discussed with RN who states pt is independent. MRI negative for acute abnormality. No further acute or follow up PT needs. Thank you for this consult. ? ?Wyona Almas, PT, DPT ?Acute Rehabilitation Services ?Pager [phone] ?Office [phone] ? ? ? ?Deno Etienne ?12/14/2021, 1:59 PM ?

## 2021-12-14 NOTE — Progress Notes (Signed)
PT Cancellation Note ? ?Patient Details ?Name: Erin Craig ?MRN: [id number] ?DOB: 05/31/1961 ? ? ?Cancelled Treatment:    Reason Eval/Treat Not Completed: Patient at procedure or test/unavailable (MRI) ? ?Lillia Pauls, PT, DPT ?Acute Rehabilitation Services ?Pager [phone] ?Office [phone] ? ? ? ?Norval Morton ?12/14/2021, 11:01 AM ?

## 2021-12-14 NOTE — H&P (Signed)
?History and Physical  ? ? ?Erin Craig DOB: 04/13/1961 DOA: 12/13/2021 ? ?PCP: Tracey HarriesBouska, David, MD  ? ?Patient coming from: Home  ? ?Chief Complaint: Left hand tingling, left arm weakness, left chest pain  ? ?HPI: Erin Dillonnnette Gentry is a pleasant 61 y.o. female with medical history significant for type 2 diabetes mellitus, now presenting to the emergency department for evaluation of left arm weakness, tingling in her right hand, and pain in her chest.  The patient reports 2 days of weakness throughout the left arm with tingling in the left hand, and then developed pain in the left chest this morning which has been constant since.  She denies headache, fever, chills, vision change, difficulty swallowing, or any numbness or weakness in the legs.  She denies any difficulty with speech or ambulation.  She developed chest pain while at rest this morning and has had constant discomfort localized to the left chest, worse with palpation and certain arm movements, and not associated with shortness of breath, nausea, or diaphoresis.  She has never experienced these symptoms before but had been experiencing palpitations at night for weeks to months, was noted by her PCP to be bradycardic in the 40s, and recently wore a heart monitor for 3 days but has not gotten the results yet. ? ?[facility] ED Course: Upon arrival to the ED, patient is found to be afebrile and saturating well on room air with stable blood pressure and normal heart rate.  EKG features sinus rhythm with notched P wave and nonspecific T wave abnormality.  Troponin was normal x2 and D-dimer was normal.  No acute findings were noted on chest x-ray.  Head CT was negative for acute intracranial abnormality.  Chemistry panel notable for glucose 207.  [facility]. ? ?Review of Systems:  ?All other systems reviewed and apart from HPI, are negative. ? ?Past Medical History:  ?Diagnosis Date  ? Bradycardia   ?  Diabetes mellitus without complication (HCC)   ? Heart murmur   ? Hypercholesterolemia   ? Hypertension   ? ? ?Past Surgical History:  ?Procedure Laterality Date  ? ABDOMINAL HYSTERECTOMY    ? CESAREAN SECTION    ? CHOLECYSTECTOMY    ? FOOT SURGERY    ? ? ?Social History:  ? reports that she has never smoked. She has never used smokeless tobacco. She reports that she does not drink alcohol and does not use drugs. ? ?No Known Allergies ? ?Family History  ?Problem Relation Age of Onset  ? Alzheimer's disease Other   ? Coronary artery disease Other   ? Breast cancer Other   ? Arthritis Other   ? Hypertension Other   ? Diabetes Other   ? ? ? ?Prior to Admission medications   ?Medication Sig Start Date End Date Taking? Authorizing Provider  ?cyanocobalamin (,VITAMIN B-12,) 1000 MCG/ML injection INJECT 1ML IM EVERY MONTHLY 04/07/16   [provider]  ?cyclobenzaprine (FLEXERIL) 5 MG tablet Take 2 tablets (10 mg total) by mouth 2 (two) times daily as needed for muscle spasms. 06/23/21   Horton, Mayer Maskerourtney F, MD  ?dicyclomine (BENTYL) 20 MG tablet Take 1 tablet (20 mg total) by mouth every 8 (eight) hours as needed for spasms. 08/22/21   Terrilee FilesButler, Michael C, MD  ?ibuprofen (ADVIL) 600 MG tablet Take 1 tablet (600 mg total) by mouth every 6 (six) hours as needed. 06/23/21   Horton, Mayer Maskerourtney F, MD  ?metFORMIN (GLUCOPHAGE) 500 MG tablet Take  by mouth. 09/29/16   [provider]  ?ondansetron (ZOFRAN ODT) 4 MG disintegrating tablet 4mg  ODT q4 hours prn nausea/vomit 08/22/21   08/24/21, MD  ?ondansetron (ZOFRAN) 4 MG tablet Take 1 tablet (4 mg total) by mouth every 8 (eight) hours as needed for nausea or vomiting. 12/01/21   Tegeler, 12/03/21, MD  ?oxyCODONE-acetaminophen (PERCOCET) 5-325 MG tablet Take 1 tablet by mouth every 4 (four) hours as needed for moderate pain. 06/21/21   13/12/22, MD  ?potassium chloride SA (KLOR-CON) 20 MEQ tablet Take 1 tablet (20 mEq total) by mouth daily. 10/22/20    Molpus, John, MD  ?atorvastatin (LIPITOR) 10 MG tablet Take 10 mg by mouth daily.  07/14/19  [provider]  ?furosemide (LASIX) 20 MG tablet Take 1 tablet (20 mg total) by mouth daily. 01/09/18 07/14/19  14/4/20, PA-C  ?glipiZIDE (GLUCOTROL) 5 MG tablet Take 10 mg by mouth daily before breakfast.  07/14/19  [provider]  ?lisinopril (PRINIVIL,ZESTRIL) 5 MG tablet Take 5 mg by mouth daily.  07/14/19  [provider]  ? ? ?Physical Exam: ?Vitals:  ? 12/13/21 1830 12/13/21 2000 12/13/21 2315 12/14/21 0052  ?BP: 140/72 127/78 124/64 (!) 145/87  ?Pulse: (!) 48 (!) 57 (!) 51 81  ?Resp: 16 16 18 18   ?Temp:    98.4 ?F (36.9 ?C)  ?TempSrc:    Oral  ?SpO2: 100% 99% 95% 100%  ?Weight:      ?Height:      ? ? ?Constitutional: NAD, calm  ?Eyes: PERTLA, lids and conjunctivae normal ?ENMT: Mucous membranes are moist. Posterior pharynx clear of any exudate or lesions.   ?Neck: supple, no masses  ?Respiratory: no wheezing, no crackles. No accessory muscle use.  ?Cardiovascular: S1 & S2 heard, regular rate and rhythm. No extremity edema. No significant JVD. ?Abdomen: No distension, no tenderness, soft. Bowel sounds active.  ?Musculoskeletal: no clubbing / cyanosis. No joint deformity upper and lower extremities.   ?Skin: no significant rashes, lesions, ulcers. Warm, dry, well-perfused. ?Neurologic: CN 2-12 grossly intact. Sensation intact. Strength 4/5 in left hand, 5/5 in proximal left arm, and 5/5 throughout LLE, RUE, and RLE. Alert and oriented.  ?Psychiatric: Calm. Cooperative.  ? ? ?Labs and Imaging on Admission: I have personally reviewed following labs and imaging studies ? ?CBC: ?Recent Labs  ?Lab 12/13/21 ?1401 12/13/21 ?1645  ?WBC 6.5  --   ?NEUTROABS  --  3.3  ?HGB 13.8  --   ?HCT 42.8  --   ?MCV 98.2  --   ?PLT 243  --   ? ?Basic Metabolic Panel: ?Recent Labs  ?Lab 12/13/21 ?1401  ?NA 138  ?K 4.1  ?CL 105  ?CO2 27  ?GLUCOSE 207*  ?BUN 11  ?CREATININE 0.68  ?CALCIUM 8.8*   ? ?GFR: ?Estimated Creatinine Clearance: 73.1 mL/min (by C-G formula based on SCr of 0.68 mg/dL). ?Liver Function Tests: ?No results for input(s): AST, ALT, ALKPHOS, BILITOT, PROT, ALBUMIN in the last 168 hours. ?No results for input(s): LIPASE, AMYLASE in the last 168 hours. ?No results for input(s): AMMONIA in the last 168 hours. ?Coagulation Profile: ?Recent Labs  ?Lab 12/13/21 ?1645  ?INR 1.0  ? ?Cardiac Enzymes: ?No results for input(s): CKTOTAL, CKMB, CKMBINDEX, TROPONINI in the last 168 hours. ?BNP (last 3 results) ?No results for input(s): PROBNP in the last 8760 hours. ?HbA1C: ?No results for input(s): HGBA1C in the last 72 hours. ?CBG: ?No results for input(s): GLUCAP in the last 168 hours. ?  Lipid Profile: ?No results for input(s): CHOL, HDL, LDLCALC, TRIG, CHOLHDL, LDLDIRECT in the last 72 hours. ?Thyroid Function Tests: ?No results for input(s): TSH, T4TOTAL, FREET4, T3FREE, THYROIDAB in the last 72 hours. ?Anemia Panel: ?No results for input(s): VITAMINB12, FOLATE, FERRITIN, TIBC, IRON, RETICCTPCT in the last 72 hours. ?Urine analysis: ?   ?Component Value Date/Time  ? COLORURINE YELLOW 12/13/2021 1825  ? APPEARANCEUR CLEAR 12/13/2021 1825  ? LABSPEC 1.015 12/13/2021 1825  ? PHURINE 7.0 12/13/2021 1825  ? GLUCOSEU NEGATIVE 12/13/2021 1825  ? HGBUR NEGATIVE 12/13/2021 1825  ? BILIRUBINUR NEGATIVE 12/13/2021 1825  ? KETONESUR NEGATIVE 12/13/2021 1825  ? PROTEINUR NEGATIVE 12/13/2021 1825  ? UROBILINOGEN 0.2 08/17/2011 2123  ? NITRITE NEGATIVE 12/13/2021 1825  ? LEUKOCYTESUR TRACE (A) 12/13/2021 1825  ? ?Sepsis Labs: ?@LABRCNTIP (procalcitonin:4,lacticidven:4) ?)No results found for this or any previous visit (from the past 240 hour(s)).  ? ?Radiological Exams on Admission: ?DG Chest 2 View ? ?Result Date: 12/13/2021 ?CLINICAL DATA:  Left-sided chest pain. EXAM: CHEST - 2 VIEW COMPARISON:  09/20/2021 FINDINGS: The heart size is normal. Stable tortuosity of thoracic aorta. Both lungs are clear. The visualized  skeletal structures are unremarkable. IMPRESSION: No active cardiopulmonary disease. Electronically Signed   By: 11/18/2021 M.D.   On: 12/13/2021 14:09  ? ?CT HEAD WO CONTRAST ? ?Result Date: 12/13/2021 ?CLINICAL DATA:  Ne

## 2021-12-14 NOTE — Hospital Course (Signed)
Erin Craig is a 61 y.o. F with diabetes who presented with 2 days left arm weakness, paresthesia. ? ?In the ER, CT head normal.  She also underwent work up for chest pain, which was reassuring.   ?

## 2021-12-18 ENCOUNTER — Other Ambulatory Visit: Payer: Self-pay

## 2021-12-18 ENCOUNTER — Encounter (HOSPITAL_COMMUNITY): Payer: Self-pay

## 2021-12-18 ENCOUNTER — Emergency Department (HOSPITAL_COMMUNITY)
Admission: EM | Admit: 2021-12-18 | Discharge: 2021-12-18 | Disposition: A | Discharge status: Home / Self Care | Payer: 59 | Attending: Emergency Medicine | Admitting: Emergency Medicine

## 2021-12-18 ENCOUNTER — Emergency Department (HOSPITAL_COMMUNITY): Payer: 59

## 2021-12-18 DIAGNOSIS — R011 Cardiac murmur, unspecified: Secondary | ICD-10-CM | POA: Diagnosis not present

## 2021-12-18 DIAGNOSIS — R0602 Shortness of breath: Secondary | ICD-10-CM | POA: Diagnosis present

## 2021-12-18 DIAGNOSIS — R001 Bradycardia, unspecified: Secondary | ICD-10-CM | POA: Diagnosis not present

## 2021-12-18 DIAGNOSIS — Z79899 Other long term (current) drug therapy: Secondary | ICD-10-CM | POA: Diagnosis not present

## 2021-12-18 DIAGNOSIS — I10 Essential (primary) hypertension: Secondary | ICD-10-CM | POA: Diagnosis not present

## 2021-12-18 DIAGNOSIS — Z7982 Long term (current) use of aspirin: Secondary | ICD-10-CM | POA: Diagnosis not present

## 2021-12-18 DIAGNOSIS — Z7984 Long term (current) use of oral hypoglycemic drugs: Secondary | ICD-10-CM | POA: Insufficient documentation

## 2021-12-18 DIAGNOSIS — E1165 Type 2 diabetes mellitus with hyperglycemia: Secondary | ICD-10-CM | POA: Diagnosis not present

## 2021-12-18 LAB — BASIC METABOLIC PANEL
Anion gap: 8 (ref 5–15)
BUN: 12 mg/dL (ref 6–20)
CO2: 24 mmol/L (ref 22–32)
Calcium: 9.4 mg/dL (ref 8.9–10.3)
Chloride: 107 mmol/L (ref 98–111)
Creatinine, Ser: 0.75 mg/dL (ref 0.44–1.00)
GFR, Estimated: >60 mL/min (ref >60)
Glucose, Bld: 182 mg/dL — ABNORMAL HIGH (ref 70–99)
Potassium: 4.4 mmol/L (ref 3.5–5.1)
Sodium: 139 mmol/L (ref 135–145)

## 2021-12-18 LAB — TROPONIN I (HIGH SENSITIVITY)
Troponin I (High Sensitivity): 2 ng/L (ref <18)
Troponin I (High Sensitivity): <2 ng/L (ref <18)

## 2021-12-18 NOTE — ED Provider Notes (Signed)
?[facility] DEPT ?Provider Note ? ? ?CSN: [id number] ?Arrival date & time: 12/18/21  2025 ? ?  ? ?History ? ?Chief Complaint  ?Patient presents with  ? Shortness of Breath  ? ? ?Erin Craig is a 61 y.o. female. ? ? ?Shortness of Breath ?Associated symptoms: chest pain   ?Associated symptoms: no abdominal pain, no fever and no vomiting   ? ?61 year old female presents to the emergency department with less than 1 day history of shortness of breath.  She said that her shortness of breath began she when she was riding in a car with her daughter.  Her daughter recommended to immediately come to the emergency department.  The pain is described as pressure/tightness.  It lasted 20-30 minutes.  She is not currently experiencing pain.  Patient is most concerned about her CT angiography of her chest which was performed today, but was not secondary to the tech not being able to get a good vein.  She was told by an individual at her cardiologist office of a potential aortic aneurysm and that her CT angiography was placed as a stat order to evaluate.  She has been increasingly worried since this event.  She has not seen the results herself or has had anyone sit down explained the results from her echocardiogram.  She is currently endorsing no symptoms.  She currently denies chest pain, shortness of breath, abdominal pain, nausea/vomiting/diarrhea, urinary/vaginal symptoms, change in bowel habits, fevers, chills, night sweats ? ?She has a past medical history significant for persistent bradycardia, diabetes mellitus, hypercholesterolemia, hypertension ? ?Home Medications ?Prior to Admission medications   ?Medication Sig Start Date End Date Taking? Authorizing Provider  ?aspirin EC 81 MG tablet Take 1 tablet (81 mg total) by mouth daily. Swallow whole. 12/14/21 12/14/22  DanfordSuann Larry, MD  ?cyanocobalamin (,VITAMIN B-12,) 1000 MCG/ML injection Inject 1,000 mcg into the muscle every 30 (thirty)  days. 04/07/16   [provider]  ?cyclobenzaprine (FLEXERIL) 5 MG tablet Take 2 tablets (10 mg total) by mouth 2 (two) times daily as needed for muscle spasms. 06/23/21   Horton, Barbette Hair, MD  ?dicyclomine (BENTYL) 20 MG tablet Take 1 tablet (20 mg total) by mouth every 8 (eight) hours as needed for spasms. 08/22/21   Hayden Rasmussen, MD  ?ibuprofen (ADVIL) 600 MG tablet Take 1 tablet (600 mg total) by mouth every 6 (six) hours as needed. 06/23/21   Horton, Barbette Hair, MD  ?metFORMIN (GLUCOPHAGE) 500 MG tablet Take by mouth. 09/29/16   [provider]  ?omeprazole (PRILOSEC) 40 MG capsule Take 40 mg by mouth daily. 11/09/21   [provider]  ?ondansetron (ZOFRAN ODT) 4 MG disintegrating tablet 4mg  ODT q4 hours prn nausea/vomit 08/22/21   Hayden Rasmussen, MD  ?ondansetron (ZOFRAN) 4 MG tablet Take 1 tablet (4 mg total) by mouth every 8 (eight) hours as needed for nausea or vomiting. 12/01/21   Tegeler, Gwenyth Allegra, MD  ?oxyCODONE-acetaminophen (PERCOCET) 5-325 MG tablet Take 1 tablet by mouth every 4 (four) hours as needed for moderate pain. [id number]   Delora Fuel, MD  ?potassium chloride SA (KLOR-CON) 20 MEQ tablet Take 1 tablet (20 mEq total) by mouth daily. 10/22/20   Molpus, John, MD  ?atorvastatin (LIPITOR) 10 MG tablet Take 10 mg by mouth daily.  07/14/19  [provider]  ?furosemide (LASIX) 20 MG tablet Take 1 tablet (20 mg total) by mouth daily. 01/09/18 07/14/19  Recardo Evangelist, PA-C  ?glipiZIDE (GLUCOTROL) 5 MG  tablet Take 10 mg by mouth daily before breakfast.  07/14/19  [provider]  ?lisinopril (PRINIVIL,ZESTRIL) 5 MG tablet Take 5 mg by mouth daily.  07/14/19  [provider]  ?   ? ?Allergies    ?Patient has no known allergies.   ? ?Review of Systems   ?Review of Systems  ?Constitutional:  Negative for chills and fever.  ?Respiratory:  Positive for shortness of breath.   ?Cardiovascular:  Positive for chest pain.  ?Gastrointestinal:  Negative  for abdominal pain, diarrhea, nausea and vomiting.  ?Genitourinary:  Negative for dysuria and enuresis.  ?Musculoskeletal:  Negative for myalgias.  ?All other systems reviewed and are negative. ? ?Physical Exam ?Updated Vital Signs ?BP 113/60   Pulse (!) 54   Temp 99.1 ?F (37.3 ?C) (Oral)   Resp 20   Ht 5\' 4"  (1.626 m)   Wt 67 kg   LMP  (LMP Unknown)   SpO2 98%   BMI 25.35 kg/m?  ?Physical Exam ?Constitutional:   ?   General: She is not in acute distress. ?   Appearance: She is well-developed. She is not ill-appearing or toxic-appearing.  ?HENT:  ?   Head: Normocephalic and atraumatic.  ?   Mouth/Throat:  ?   Mouth: Mucous membranes are moist.  ?Eyes:  ?   Extraocular Movements: Extraocular movements intact.  ?   Pupils: Pupils are equal, round, and reactive to light.  ?Cardiovascular:  ?   Rate and Rhythm: Regular rhythm. Bradycardia present.  ?   Heart sounds: Murmur heard.  ?   Comments: Decreased posterior tibial pulses but still symmetrical and palpable.  Lower extremities cool to the touch.  Radial pulses equal and full bilaterally. ? ?Systolic murmur noted right upper sternal border.  ?Pulmonary:  ?   Effort: Pulmonary effort is normal.  ?   Breath sounds: Normal breath sounds. No decreased breath sounds, wheezing, rhonchi or rales.  ?Chest:  ?   Chest wall: No tenderness.  ?Musculoskeletal:     ?   General: Normal range of motion.  ?   Cervical back: Normal range of motion and neck supple.  ?   Right lower leg: No edema.  ?   Left lower leg: No edema.  ?Skin: ?   General: Skin is dry.  ?   Capillary Refill: Capillary refill takes less than 2 seconds.  ?Neurological:  ?   General: No focal deficit present.  ?   Mental Status: She is alert and oriented to person, place, and time.  ?Psychiatric:     ?   Mood and Affect: Mood normal.     ?   Behavior: Behavior normal.  ? ? ?ED Results / Procedures / Treatments   ?Labs ?(all labs ordered are listed, but only abnormal results are displayed) ?Labs Reviewed   ?BASIC METABOLIC PANEL - Abnormal; Notable for the following components:  ?    Result Value  ? Glucose, Bld 182 (*)   ? All other components within normal limits  ?CBC WITH DIFFERENTIAL/PLATELET  ?CBC WITH DIFFERENTIAL/PLATELET  ?TROPONIN I (HIGH SENSITIVITY)  ?TROPONIN I (HIGH SENSITIVITY)  ? ? ?EKG ?EKG Interpretation ? ?Date/Time:  Thursday Dec 18 2021 20:35:21 EDT ?Ventricular Rate:  64 ?PR Interval:  122 ?QRS Duration: 86 ?QT Interval:  405 ?QTC Calculation: 418 ?R Axis:   75 ?Text Interpretation: Sinus rhythm Borderline T wave abnormalities Baseline wander in lead(s) I Artifact Abnormal ECG Confirmed by Carmin Muskrat [phone]) on 12/18/2021 11:13:34 PM ? ?Radiology ?  DG Chest 2 View ? ?Result Date: 12/18/2021 ?CLINICAL DATA:  Shortness of breath. EXAM: CHEST - 2 VIEW COMPARISON:  Dec 13, 2021 FINDINGS: The heart size and mediastinal contours are within normal limits. Tortuosity of the descending thoracic aorta is noted. Both lungs are clear. Radiopaque surgical clips are seen within the right upper quadrant. The visualized skeletal structures are unremarkable. IMPRESSION: No active cardiopulmonary disease. Electronically Signed   By: Virgina Norfolk M.D.   On: 12/18/2021 20:59   ? ?Procedures ?Procedures  ? ? ?Medications Ordered in ED ?Medications - No data to display ? ?ED Course/ Medical Decision Making/ A&P ?  ?                        ?Medical Decision Making ?Amount and/or Complexity of Data Reviewed ?Radiology: ordered. ? ? ?This patient presents to the ED for concern of chest pain, shortness of breath, this involves an extensive number of treatment options, and is a complaint that carries with it a high risk of complications and morbidity.  The differential diagnosis includes COPD exacerbation, pneumonia, asthma exacerbation, pneumothorax, aortic dissection, ruptured aortic aneurysm, ACS, PE ? ? ?Co morbidities that complicate the patient evaluation ? ?persistent bradycardia, diabetes mellitus,  hypercholesterolemia, hypertension ? ? ?Additional history obtained: ? ?Additional history obtained from echocardiogram from 12/05/2021 done at [facility] ?External records from outside source obtained and reviewed including

## 2021-12-18 NOTE — ED Triage Notes (Signed)
Patient said out of nowhere she got short of breath even just sitting. Was admitted last week and they said she has a dilated aorta.  ?

## 2021-12-18 NOTE — Discharge Instructions (Signed)
Please follow through with your CT angiography at 715 tomorrow morning.  Discuss with your cardiologist concerning results of the study.  Please do not hesitate to return to the emergency department if the worrisome signs and symptoms we discussed become apparent. ?

## 2021-12-18 NOTE — ED Provider Triage Note (Signed)
Emergency Medicine Provider Triage Evaluation Note ? ?Erin Craig , a 61 y.o. female  was evaluated in triage.  Pt complains of chest pain.  Patient states that she is having shortness of breath and feeling like her heart is fluttering.  She states that she was recently discharged from the hospital after being admitted for the same thing.  She states that when she left the hospital she was still having the same symptoms.  She also endorses lightheadedness once today.  She states she overall just feels poorly.  She denies any cough or fevers.  Denies lower extremity swelling.  States that she had an echo recently with cardiology and was told her aorta was dilated.. ? ?Review of Systems  ?Positive: See above ?Negative:  ? ?Physical Exam  ?BP (!) 148/81 (BP Location: Left Arm)   Pulse 66   Temp 99.1 ?F (37.3 ?C) (Oral)   Resp 15   Ht 5\' 4"  (1.626 m)   Wt 67 kg   LMP  (LMP Unknown)   SpO2 100%   BMI 25.35 kg/m?  ?Gen:   Awake, appears tired ?Resp:  Normal effort, CTA B ?MSK:   Moves extremities without difficulty  ?Other:  S1/S2 ? ?Medical Decision Making  ?Medically screening exam initiated at 8:57 PM.  Appropriate orders placed.  Berania Blumenshine was informed that the remainder of the evaluation will be completed by another provider, this initial triage assessment does not replace that evaluation, and the importance of remaining in the ED until their evaluation is complete. ? ? ?  ?Mickie Hillier, PA-C ?12/18/21 2058 ? ?

## 2021-12-22 ENCOUNTER — Other Ambulatory Visit: Payer: 59

## 2022-01-02 ENCOUNTER — Encounter (HOSPITAL_BASED_OUTPATIENT_CLINIC_OR_DEPARTMENT_OTHER): Payer: Self-pay

## 2022-01-02 ENCOUNTER — Emergency Department (HOSPITAL_BASED_OUTPATIENT_CLINIC_OR_DEPARTMENT_OTHER)
Admission: EM | Admit: 2022-01-02 | Discharge: 2022-01-02 | Disposition: A | Discharge status: Home / Self Care | Payer: 59 | Attending: Emergency Medicine | Admitting: Emergency Medicine

## 2022-01-02 ENCOUNTER — Other Ambulatory Visit: Payer: Self-pay

## 2022-01-02 ENCOUNTER — Emergency Department (HOSPITAL_BASED_OUTPATIENT_CLINIC_OR_DEPARTMENT_OTHER): Payer: 59 | Admitting: Radiology

## 2022-01-02 DIAGNOSIS — E1165 Type 2 diabetes mellitus with hyperglycemia: Secondary | ICD-10-CM | POA: Insufficient documentation

## 2022-01-02 DIAGNOSIS — R059 Cough, unspecified: Secondary | ICD-10-CM | POA: Diagnosis present

## 2022-01-02 DIAGNOSIS — J209 Acute bronchitis, unspecified: Secondary | ICD-10-CM | POA: Insufficient documentation

## 2022-01-02 DIAGNOSIS — J40 Bronchitis, not specified as acute or chronic: Secondary | ICD-10-CM

## 2022-01-02 DIAGNOSIS — Z7984 Long term (current) use of oral hypoglycemic drugs: Secondary | ICD-10-CM | POA: Diagnosis not present

## 2022-01-02 DIAGNOSIS — Z7982 Long term (current) use of aspirin: Secondary | ICD-10-CM | POA: Insufficient documentation

## 2022-01-02 MED ORDER — PREDNISONE 20 MG PO TABS
40.0000 mg | ORAL_TABLET | Freq: Once | ORAL | Status: AC
  Administered 2022-01-02: 40 mg via ORAL
  Filled 2022-01-02: qty 2

## 2022-01-02 MED ORDER — PREDNISONE 20 MG PO TABS: 40.0000 mg | ORAL_TABLET | Freq: Every day | ORAL | 0 refills | Status: AC

## 2022-01-02 NOTE — ED Triage Notes (Signed)
Reports generalized body aches, nonproductive cough and subjective wheezing x 2 weeks.   Called PCP and was given rx for cough syrup, abx and an inhaler.

## 2022-01-02 NOTE — ED Provider Notes (Signed)
[facility] EMERGENCY DEPT  Provider Note  CSN: [id number] Arrival date & time: 01/02/22 0056  History Chief Complaint  Patient presents with   Cough    Erin Craig is a 61 y.o. female here for evaluation of non-productive cough ongoing for about 2 weeks, no fevers, but cough is associated with wheezing, body aches, worse at night and not sleeping well. Had a Telehealth visit with PCP on 5/22 and given Rx for Inhaler, Zpak and tussionex without improvement. She was last seen in the ED on 5/11 for SOB, had neg CXR then, outpatient CTA was done 5/12 to evaluate her aorta but lungs were clear then as well.    Home Medications Prior to Admission medications   Medication Sig Start Date End Date Taking? Authorizing Provider  predniSONE (DELTASONE) 20 MG tablet Take 2 tablets (40 mg total) by mouth daily for 4 days. 01/02/22 01/06/22 Yes Pollyann Savoy, MD  aspirin EC 81 MG tablet Take 1 tablet (81 mg total) by mouth daily. Swallow whole. 12/14/21 12/14/22  Danford, Earl Lites, MD  cyanocobalamin (,VITAMIN B-12,) 1000 MCG/ML injection Inject 1,000 mcg into the muscle every 30 (thirty) days. 04/07/16   [provider]  cyclobenzaprine (FLEXERIL) 5 MG tablet Take 2 tablets (10 mg total) by mouth 2 (two) times daily as needed for muscle spasms. 06/23/21   Horton, Mayer Masker, MD  dicyclomine (BENTYL) 20 MG tablet Take 1 tablet (20 mg total) by mouth every 8 (eight) hours as needed for spasms. 08/22/21   Terrilee Files, MD  ibuprofen (ADVIL) 600 MG tablet Take 1 tablet (600 mg total) by mouth every 6 (six) hours as needed. 06/23/21   Horton, Mayer Masker, MD  metFORMIN (GLUCOPHAGE) 500 MG tablet Take by mouth. 09/29/16   [provider]  omeprazole (PRILOSEC) 40 MG capsule Take 40 mg by mouth daily. 11/09/21   [provider]  ondansetron (ZOFRAN ODT) 4 MG disintegrating tablet 4mg  ODT q4 hours prn nausea/vomit 08/22/21   08/24/21, MD  ondansetron  (ZOFRAN) 4 MG tablet Take 1 tablet (4 mg total) by mouth every 8 (eight) hours as needed for nausea or vomiting. 12/01/21   Tegeler, 12/03/21, MD  oxyCODONE-acetaminophen (PERCOCET) 5-325 MG tablet Take 1 tablet by mouth every 4 (four) hours as needed for moderate pain. 06/21/21   13/12/22, MD  potassium chloride SA (KLOR-CON) 20 MEQ tablet Take 1 tablet (20 mEq total) by mouth daily. 10/22/20   Molpus, John, MD  atorvastatin (LIPITOR) 10 MG tablet Take 10 mg by mouth daily.  07/14/19  [provider]  furosemide (LASIX) 20 MG tablet Take 1 tablet (20 mg total) by mouth daily. 01/09/18 07/14/19  14/4/20, PA-C  glipiZIDE (GLUCOTROL) 5 MG tablet Take 10 mg by mouth daily before breakfast.  07/14/19  [provider]  lisinopril (PRINIVIL,ZESTRIL) 5 MG tablet Take 5 mg by mouth daily.  07/14/19  [provider]     Allergies    Patient has no known allergies.   Review of Systems   Review of Systems Please see HPI for pertinent positives and negatives  Physical Exam BP 114/72 (BP Location: Right Arm)   Pulse (!) 49   Temp 98.2 F (36.8 C)   Resp 16   Ht 5\' 4"  (1.626 m)   Wt 72.6 kg   LMP  (LMP Unknown)   SpO2 100%   BMI 27.46 kg/m   Physical Exam Vitals and nursing note reviewed.  Constitutional:  Appearance: Normal appearance.  HENT:     Head: Normocephalic and atraumatic.     Nose: Nose normal.     Mouth/Throat:     Mouth: Mucous membranes are moist.  Eyes:     Extraocular Movements: Extraocular movements intact.     Conjunctiva/sclera: Conjunctivae normal.  Cardiovascular:     Rate and Rhythm: Normal rate.  Pulmonary:     Effort: Pulmonary effort is normal.     Breath sounds: Normal breath sounds. No wheezing, rhonchi or rales.  Abdominal:     General: Abdomen is flat.     Palpations: Abdomen is soft.     Tenderness: There is no abdominal tenderness.  Musculoskeletal:        General: No swelling. Normal range of motion.      Cervical back: Neck supple.  Skin:    General: Skin is warm and dry.  Neurological:     General: No focal deficit present.     Mental Status: She is alert.  Psychiatric:        Mood and Affect: Mood normal.    ED Results / Procedures / Treatments   EKG None  Procedures Procedures  Medications Ordered in the ED Medications  predniSONE (DELTASONE) tablet 40 mg (40 mg Oral Given 01/02/22 0153)    Initial Impression and Plan  Patient here with cough most consistent with a bronchitis, exam is benign, no fevers, no hypoxia or tachypnea. She is not in any distress. No suspicion for ACS, PE or other cardiac issues. Will check CXR to rule out a developing pneumonia. May need some steroids if no signs of infection.   ED Course   Clinical Course as of 01/02/22 0200  Fri Jan 02, 2022  0147 I personally viewed the images from radiology studies and agree with radiologist interpretation: CXR remains clear. Plan discharge with Rx for prednisone, advised to watch glucose closely and follow up with PCP.   [CS]    Clinical Course User Index [CS] Pollyann Savoy, MD     MDM Rules/Calculators/A&P Medical Decision Making Problems Addressed: Bronchitis: acute illness or injury  Amount and/or Complexity of Data Reviewed External Data Reviewed: radiology and notes. Radiology: ordered and independent interpretation performed. Decision-making details documented in ED Course.  Risk Prescription drug management.    Final Clinical Impression(s) / ED Diagnoses Final diagnoses:  Bronchitis    Rx / DC Orders ED Discharge Orders          Ordered    predniSONE (DELTASONE) 20 MG tablet  Daily        01/02/22 0159             Pollyann Savoy, MD 01/02/22 0200

## 2022-02-05 ENCOUNTER — Ambulatory Visit (INDEPENDENT_AMBULATORY_CARE_PROVIDER_SITE_OTHER): Payer: 59

## 2022-02-05 ENCOUNTER — Ambulatory Visit (INDEPENDENT_AMBULATORY_CARE_PROVIDER_SITE_OTHER): Payer: 59 | Admitting: Podiatry

## 2022-02-05 ENCOUNTER — Encounter: Payer: Self-pay | Admitting: Podiatry

## 2022-02-05 ENCOUNTER — Other Ambulatory Visit: Payer: Self-pay | Admitting: Podiatry

## 2022-02-05 DIAGNOSIS — M25572 Pain in left ankle and joints of left foot: Secondary | ICD-10-CM

## 2022-02-05 DIAGNOSIS — M7752 Other enthesopathy of left foot: Secondary | ICD-10-CM | POA: Diagnosis not present

## 2022-02-05 MED ORDER — MELOXICAM 15 MG PO TABS: 15.0000 mg | ORAL_TABLET | Freq: Every day | ORAL | 0 refills | Status: DC

## 2022-02-05 MED ORDER — DEXAMETHASONE SODIUM PHOSPHATE 120 MG/30ML IJ SOLN
4.0000 mg | Freq: Once | INTRAMUSCULAR | Status: AC
  Administered 2022-02-05: 4 mg via INTRA_ARTICULAR

## 2022-02-05 NOTE — Progress Notes (Signed)
  Subjective:  Patient ID: Erin Craig, female    DOB: 01/19/1961,   MRN: [id number]  Chief Complaint  Patient presents with   Ankle Pain    L ankle pain, swollen    61 y.o. female presents for concern of left ankle pain that has been going on for the past 3-4 weeks. Relates some swelling and constant throbbing pain. She is diabetic and Last A1c was 6.0.  Denies any other pedal complaints. Denies n/v/f/c.   Past Medical History:  Diagnosis Date   Bradycardia    Diabetes mellitus without complication (HCC)    Heart murmur    Hypercholesterolemia    Hypertension     Objective:  Physical Exam: Vascular: DP/PT pulses 2/4 bilateral. CFT <3 seconds. Normal hair growth on digits. No edema.  Skin. No lacerations or abrasions bilateral feet.  Musculoskeletal: MMT 5/5 bilateral lower extremities in DF, PF, Inversion and Eversion. Deceased ROM in DF of ankle joint. Tender over lateral ankle joint line. And some pain over ATFL. Negative anterior drawer signs. No pain along peroneal tendons. No pain medial around angle. No pain in achilles. No pain with DF, PF and no pain with STJ ROM.  Neurological: Sensation intact to light touch.   Assessment:   1. Capsulitis of left ankle      Plan:  Patient was evaluated and treated and all questions answered. Discussed capsulitis and inflammation of ankle joint and treatment options with patient.  Radiographs reviewed and discussed with patient. No acute fractures or dislocations noted.   Injection offered today. Patient in agreement.  Meloxicam sent to pharmacy.  Ankle compression sleeve provided.  Discussed if pain does not improve may consider PT and/or MRI for further surgical planning.  Patient to return in 6 weeks or sooner if concerns arise.    Procedure: Injection Tendon/Ligament Discussed alternatives, risks, complications and verbal consent was obtained.  Location: Left ankle. Skin Prep: Alcohol. Injectate: 1cc 0.5% marcaine plain,  1 cc dexamethasone.  Disposition: Patient tolerated procedure well. Injection site dressed with a band-aid.  Post-injection care was discussed and return precautions discussed.    Louann Sjogren, DPM

## 2022-02-09 ENCOUNTER — Other Ambulatory Visit: Payer: Self-pay

## 2022-02-09 ENCOUNTER — Emergency Department (HOSPITAL_BASED_OUTPATIENT_CLINIC_OR_DEPARTMENT_OTHER)
Admission: EM | Admit: 2022-02-09 | Discharge: 2022-02-09 | Disposition: A | Discharge status: Home / Self Care | Payer: 59 | Attending: Emergency Medicine | Admitting: Emergency Medicine

## 2022-02-09 ENCOUNTER — Emergency Department (HOSPITAL_BASED_OUTPATIENT_CLINIC_OR_DEPARTMENT_OTHER): Payer: 59

## 2022-02-09 ENCOUNTER — Encounter (HOSPITAL_BASED_OUTPATIENT_CLINIC_OR_DEPARTMENT_OTHER): Payer: Self-pay

## 2022-02-09 DIAGNOSIS — M7989 Other specified soft tissue disorders: Secondary | ICD-10-CM | POA: Insufficient documentation

## 2022-02-09 DIAGNOSIS — M25472 Effusion, left ankle: Secondary | ICD-10-CM | POA: Insufficient documentation

## 2022-02-09 DIAGNOSIS — Z7982 Long term (current) use of aspirin: Secondary | ICD-10-CM | POA: Diagnosis not present

## 2022-02-09 DIAGNOSIS — Z7984 Long term (current) use of oral hypoglycemic drugs: Secondary | ICD-10-CM | POA: Diagnosis not present

## 2022-02-09 DIAGNOSIS — Z79899 Other long term (current) drug therapy: Secondary | ICD-10-CM | POA: Insufficient documentation

## 2022-02-09 DIAGNOSIS — M25572 Pain in left ankle and joints of left foot: Secondary | ICD-10-CM | POA: Insufficient documentation

## 2022-02-09 DIAGNOSIS — E119 Type 2 diabetes mellitus without complications: Secondary | ICD-10-CM | POA: Insufficient documentation

## 2022-02-09 LAB — CBC WITH DIFFERENTIAL/PLATELET
Abs Immature Granulocytes: 0.03 10*3/uL (ref 0.00–0.07)
Basophils Absolute: 0 10*3/uL (ref 0.0–0.1)
Basophils Relative: 0 %
Eosinophils Absolute: 0.1 10*3/uL (ref 0.0–0.5)
Eosinophils Relative: 1 %
HCT: 43 % (ref 36.0–46.0)
Hemoglobin: 13.7 g/dL (ref 12.0–15.0)
Immature Granulocytes: 0 %
Lymphocytes Relative: 36 %
Lymphs Abs: 3 10*3/uL (ref 0.7–4.0)
MCH: 31.6 pg (ref 26.0–34.0)
MCHC: 31.9 g/dL (ref 30.0–36.0)
MCV: 99.3 fL (ref 80.0–100.0)
Monocytes Absolute: 0.6 10*3/uL (ref 0.1–1.0)
Monocytes Relative: 7 %
Neutro Abs: 4.7 10*3/uL (ref 1.7–7.7)
Neutrophils Relative %: 56 %
Platelets: 199 10*3/uL (ref 150–400)
RBC: 4.33 MIL/uL (ref 3.87–5.11)
RDW: 13.3 % (ref 11.5–15.5)
WBC: 8.5 10*3/uL (ref 4.0–10.5)
nRBC: 0 % (ref 0.0–0.2)

## 2022-02-09 LAB — BASIC METABOLIC PANEL
Anion gap: 9 (ref 5–15)
BUN: 17 mg/dL (ref 6–20)
CO2: 23 mmol/L (ref 22–32)
Calcium: 9.2 mg/dL (ref 8.9–10.3)
Chloride: 108 mmol/L (ref 98–111)
Creatinine, Ser: 0.66 mg/dL (ref 0.44–1.00)
GFR, Estimated: >60 mL/min (ref >60)
Glucose, Bld: 82 mg/dL (ref 70–99)
Potassium: 3.8 mmol/L (ref 3.5–5.1)
Sodium: 140 mmol/L (ref 135–145)

## 2022-02-09 MED ORDER — OXYCODONE-ACETAMINOPHEN 5-325 MG PO TABS
1.0000 | ORAL_TABLET | Freq: Once | ORAL | Status: AC
  Administered 2022-02-09: 1 via ORAL
  Filled 2022-02-09: qty 1

## 2022-02-09 NOTE — Discharge Instructions (Addendum)
We recommend continued management by your primary care doctor or the orthopedic doctor for this leg swelling.  Ultrasound is not showing any evidence of blood clot or cyst in the back of your leg.  Continue the anti-inflammatory medication.

## 2022-02-09 NOTE — ED Triage Notes (Addendum)
Patient here POV from Home.  Endorses Swelling and Pain to Left Lateral Ankle. Unsure when it began but endorses noting it approximately 1-2 Weeks.   No Known Injuries or Trauma. Patient had Imaging completed to Same 4 Days PTA with MD. Studies Available for Review.  NAD Noted during Triage. A&Ox4. GCS 15. Ambulatory.

## 2022-02-09 NOTE — ED Provider Notes (Signed)
[facility] EMERGENCY DEPT Provider Note   CSN: [id number] Arrival date & time: 02/09/22  1136     History  Chief Complaint  Patient presents with   Ankle Pain    Erin Craig is a 61 y.o. female.  HPI     61 year old female comes in with chief complaint of ankle pain.  Patient indicates that she has been having ankle swelling over the past 2 to 3 weeks.  The swelling is on the left side.  She is now noticing swelling over the leg as well.  She saw podiatrist few days back.  X-rays were negative.  She was given a shot over her ankle and advised to follow-up again with prescription of Mobic.  She indicates that her pain was severe and she wanted to get the ankle checked out.  Patient denies any trauma.  Pt has no hx of PE, DVT and denies any exogenous hormone (testosterone / estrogen) use, long distance travels or surgery in the past 6 weeks, active cancer, recent immobilization.  Review of systems negative for any fevers.  Home Medications Prior to Admission medications   Medication Sig Start Date End Date Taking? Authorizing Provider  aspirin EC 81 MG tablet Take 1 tablet (81 mg total) by mouth daily. Swallow whole. 12/14/21 12/14/22  Danford, Earl Lites, MD  cyanocobalamin (,VITAMIN B-12,) 1000 MCG/ML injection Inject 1,000 mcg into the muscle every 30 (thirty) days. 04/07/16   [provider]  cyclobenzaprine (FLEXERIL) 5 MG tablet Take 2 tablets (10 mg total) by mouth 2 (two) times daily as needed for muscle spasms. 06/23/21   Horton, Mayer Masker, MD  dicyclomine (BENTYL) 20 MG tablet Take 1 tablet (20 mg total) by mouth every 8 (eight) hours as needed for spasms. 08/22/21   Terrilee Files, MD  ibuprofen (ADVIL) 600 MG tablet Take 1 tablet (600 mg total) by mouth every 6 (six) hours as needed. 06/23/21   Horton, Mayer Masker, MD  meloxicam (MOBIC) 15 MG tablet Take 1 tablet (15 mg total) by mouth daily. 02/05/22   Louann Sjogren, DPM  metFORMIN (GLUCOPHAGE)  500 MG tablet Take by mouth. 09/29/16   [provider]  omeprazole (PRILOSEC) 40 MG capsule Take 40 mg by mouth daily. 11/09/21   [provider]  ondansetron (ZOFRAN ODT) 4 MG disintegrating tablet 4mg  ODT q4 hours prn nausea/vomit 08/22/21   08/24/21, MD  ondansetron (ZOFRAN) 4 MG tablet Take 1 tablet (4 mg total) by mouth every 8 (eight) hours as needed for nausea or vomiting. 12/01/21   Tegeler, 12/03/21, MD  oxyCODONE-acetaminophen (PERCOCET) 5-325 MG tablet Take 1 tablet by mouth every 4 (four) hours as needed for moderate pain. 06/21/21   13/12/22, MD  potassium chloride SA (KLOR-CON) 20 MEQ tablet Take 1 tablet (20 mEq total) by mouth daily. 10/22/20   Molpus, John, MD  atorvastatin (LIPITOR) 10 MG tablet Take 10 mg by mouth daily.  07/14/19  [provider]  furosemide (LASIX) 20 MG tablet Take 1 tablet (20 mg total) by mouth daily. 01/09/18 07/14/19  14/4/20, PA-C  glipiZIDE (GLUCOTROL) 5 MG tablet Take 10 mg by mouth daily before breakfast.  07/14/19  [provider]  lisinopril (PRINIVIL,ZESTRIL) 5 MG tablet Take 5 mg by mouth daily.  07/14/19  [provider]      Allergies    Patient has no known allergies.    Review of Systems   Review of Systems  All other systems reviewed  and are negative.   Physical Exam Updated Vital Signs BP 140/87 (BP Location: Right Arm)   Pulse 98   Temp 98.7 F (37.1 C)   Resp 19   Ht 5\' 4"  (1.626 m)   Wt 72.6 kg   LMP  (LMP Unknown)   SpO2 100%   BMI 27.47 kg/m  Physical Exam Vitals and nursing note reviewed.  Constitutional:      Appearance: She is well-developed.  HENT:     Head: Atraumatic.  Eyes:     Extraocular Movements: Extraocular movements intact.     Pupils: Pupils are equal, round, and reactive to light.  Cardiovascular:     Rate and Rhythm: Normal rate.  Pulmonary:     Effort: Pulmonary effort is normal.  Musculoskeletal:     Cervical back: Normal range  of motion and neck supple.     Comments: Left lower extremity edema, left lateral ankle edema with tenderness.  Patient's range of motion of the ankle is normal.  No warmth to touch  Skin:    General: Skin is warm and dry.  Neurological:     Mental Status: She is alert and oriented to person, place, and time.     ED Results / Procedures / Treatments   Labs (all labs ordered are listed, but only abnormal results are displayed) Labs Reviewed  CBC WITH DIFFERENTIAL/PLATELET  BASIC METABOLIC PANEL    EKG None  Radiology Venous Img Lower Unilateral Left  Result Date: 02/09/2022 CLINICAL DATA:  Left lower extremity pain and edema for the past 2 weeks. Evaluate for DVT. EXAM: LEFT LOWER EXTREMITY VENOUS DOPPLER ULTRASOUND TECHNIQUE: Gray-scale sonography with graded compression, as well as color Doppler and duplex ultrasound were performed to evaluate the lower extremity deep venous systems from the level of the common femoral vein and including the common femoral, femoral, profunda femoral, popliteal and calf veins including the posterior tibial, peroneal and gastrocnemius veins when visible. The superficial great saphenous vein was also interrogated. Spectral Doppler was utilized to evaluate flow at rest and with distal augmentation maneuvers in the common femoral, femoral and popliteal veins. COMPARISON:  Left lower extremity venous Doppler ultrasound-11/11/2017 (negative) FINDINGS: Contralateral Common Femoral Vein: Respiratory phasicity is normal and symmetric with the symptomatic side. No evidence of thrombus. Normal compressibility. Common Femoral Vein: No evidence of thrombus. Normal compressibility, respiratory phasicity and response to augmentation. Saphenofemoral Junction: No evidence of thrombus. Normal compressibility and flow on color Doppler imaging. Profunda Femoral Vein: No evidence of thrombus. Normal compressibility and flow on color Doppler imaging. Femoral Vein: No evidence of  thrombus. Normal compressibility, respiratory phasicity and response to augmentation. Popliteal Vein: No evidence of thrombus. Normal compressibility, respiratory phasicity and response to augmentation. Calf Veins: No evidence of thrombus. Normal compressibility and flow on color Doppler imaging. Superficial Great Saphenous Vein: No evidence of thrombus. Normal compressibility. Other Findings:  None. IMPRESSION: No evidence of DVT within the left lower extremity. Electronically Signed   By: 01/11/2018 M.D.   On: 02/09/2022 14:07    Procedures Procedures    Medications Ordered in ED Medications  oxyCODONE-acetaminophen (PERCOCET/ROXICET) 5-325 MG per tablet 1 tablet (1 tablet Oral Given 02/09/22 1413)    ED Course/ Medical Decision Making/ A&P                           Medical Decision Making Amount and/or Complexity of Data Reviewed Labs: ordered.  Risk Prescription drug management.  This patient presents to the ED with chief complaint(s) of left leg swelling and pain with pertinent past medical history of diabetes and evaluation for the foot by podiatrist   The differential diagnosis includes acute DVT, cellulitis, thrombophlebitis, Baker's cyst, osteoarthritis.  I have reviewed patient's podiatry visit note.  I have visualized the x-ray from the visit with the podiatry as well.  Therefore, repeat x-ray will not be ordered.  The initial plan is to get ultrasound leg to rule out DVT. Patient does have joint effusion, but no concerns for septic arthritis.  Additional history obtained: Records reviewed  reviewed x-ray that was completed recently and also the podiatry visit note  Independent labs interpretation:  The following labs were independently interpreted: Normal CBC and metabolic profile  Independent visualization of imaging: - I independently visualized the following imaging with scope of interpretation limited to determining acute life threatening conditions related to  emergency care: X-ray of the foot, which revealed no evidence of any fracture or dislocation  Treatment and Reassessment: Patient's ultrasound DVT is also negative for any acute findings.  No Baker's cyst.  Results discussed with the patient.  At the request of the patient, orthopedic surgery follow-up instruction also provided.   Final Clinical Impression(s) / ED Diagnoses Final diagnoses:  Left leg swelling    Rx / DC Orders ED Discharge Orders     None         Derwood Kaplan, MD 02/09/22 1537

## 2022-02-09 NOTE — ED Notes (Signed)
Patient transported to Ultrasound 

## 2022-02-16 DIAGNOSIS — M25872 Other specified joint disorders, left ankle and foot: Secondary | ICD-10-CM | POA: Insufficient documentation

## 2022-03-04 ENCOUNTER — Other Ambulatory Visit: Payer: Self-pay | Admitting: Podiatry

## 2022-03-19 ENCOUNTER — Encounter: Payer: Self-pay | Admitting: Podiatry

## 2022-03-19 ENCOUNTER — Ambulatory Visit (INDEPENDENT_AMBULATORY_CARE_PROVIDER_SITE_OTHER): Payer: 59 | Admitting: Podiatry

## 2022-03-19 DIAGNOSIS — M7752 Other enthesopathy of left foot: Secondary | ICD-10-CM

## 2022-03-19 DIAGNOSIS — L84 Corns and callosities: Secondary | ICD-10-CM | POA: Diagnosis not present

## 2022-03-19 NOTE — Progress Notes (Signed)
  Subjective:  Patient ID: Erin Craig, female    DOB: 1961-05-01,   MRN: [id number]  Chief Complaint  Patient presents with   capsulitis     l ankle capsulitis - patient states foot is better since the last visit , she is now concern with the callus on her left foot    61 y.o. female presents for for follow-up of left ankle pain. Relates injection really helped and ankle is doing 100% better. She does have new concern of callus on her right foot that has been acting up and starting to cause pain and wanted it checked on. She is diabetic and Last A1c was 6.0.  Denies any other pedal complaints. Denies n/v/f/c.   Past Medical History:  Diagnosis Date   Bradycardia    Diabetes mellitus without complication (HCC)    Heart murmur    Hypercholesterolemia    Hypertension     Objective:  Physical Exam: Vascular: DP/PT pulses 2/4 bilateral. CFT <3 seconds. Normal hair growth on digits. No edema.  Skin. No lacerations or abrasions bilateral feet. Hyperkeratotic lesion noted to the lateral fifth metatarsal on the left.  Musculoskeletal: MMT 5/5 bilateral lower extremities in DF, PF, Inversion and Eversion. Deceased ROM in DF of ankle joint. No pain over lateral ankle joint line. And no pain over ATFL. Negative anterior drawer signs. No pain along peroneal tendons. No pain medial around angle. No pain in achilles. No pain with DF, PF and no pain with STJ ROM.  Neurological: Sensation intact to light touch.   Assessment:   1. Capsulitis of left ankle   2. Callus of foot      Plan:  Patient was evaluated and treated and all questions answered. Discussed capsulitis and inflammation of ankle joint and treatment options with patient.  Hopefully will continue to remain pain free in ankle.  -Discussed corns and calluses with patient and treatment options.  -Hyperkeratotic tissue was debrided with chisel without incident as courtesy.  -Encouraged daily moisturizing -Discussed use of pumice  stone -Advised good supportive shoes and inserts -Patient to return to office as needed or sooner if condition worsens.    Louann Sjogren, DPM

## 2022-04-07 ENCOUNTER — Other Ambulatory Visit: Payer: Self-pay

## 2022-04-07 ENCOUNTER — Emergency Department (HOSPITAL_BASED_OUTPATIENT_CLINIC_OR_DEPARTMENT_OTHER): Payer: 59

## 2022-04-07 ENCOUNTER — Emergency Department (HOSPITAL_BASED_OUTPATIENT_CLINIC_OR_DEPARTMENT_OTHER)
Admission: EM | Admit: 2022-04-07 | Discharge: 2022-04-07 | Disposition: A | Discharge status: Home / Self Care | Payer: 59 | Attending: Emergency Medicine | Admitting: Emergency Medicine

## 2022-04-07 ENCOUNTER — Encounter (HOSPITAL_BASED_OUTPATIENT_CLINIC_OR_DEPARTMENT_OTHER): Payer: Self-pay | Admitting: Emergency Medicine

## 2022-04-07 DIAGNOSIS — Z7984 Long term (current) use of oral hypoglycemic drugs: Secondary | ICD-10-CM | POA: Diagnosis not present

## 2022-04-07 DIAGNOSIS — Z79899 Other long term (current) drug therapy: Secondary | ICD-10-CM | POA: Insufficient documentation

## 2022-04-07 DIAGNOSIS — I10 Essential (primary) hypertension: Secondary | ICD-10-CM | POA: Insufficient documentation

## 2022-04-07 DIAGNOSIS — E119 Type 2 diabetes mellitus without complications: Secondary | ICD-10-CM | POA: Insufficient documentation

## 2022-04-07 DIAGNOSIS — Z7982 Long term (current) use of aspirin: Secondary | ICD-10-CM | POA: Diagnosis not present

## 2022-04-07 DIAGNOSIS — M545 Low back pain, unspecified: Secondary | ICD-10-CM | POA: Diagnosis present

## 2022-04-07 LAB — URINALYSIS, MICROSCOPIC (REFLEX)

## 2022-04-07 LAB — URINALYSIS, ROUTINE W REFLEX MICROSCOPIC
Bilirubin Urine: NEGATIVE
Glucose, UA: NEGATIVE mg/dL
Ketones, ur: NEGATIVE mg/dL
Nitrite: NEGATIVE
Protein, ur: NEGATIVE mg/dL
Specific Gravity, Urine: >=1.03 (ref 1.005–1.030)
pH: 5 (ref 5.0–8.0)

## 2022-04-07 MED ORDER — LIDOCAINE 5 % EX PTCH
1.0000 | MEDICATED_PATCH | CUTANEOUS | Status: DC
  Administered 2022-04-07: 1 via TRANSDERMAL
  Filled 2022-04-07: qty 1

## 2022-04-07 MED ORDER — DICLOFENAC SODIUM 1 % EX GEL: 4.0000 g | Freq: Four times a day (QID) | CUTANEOUS | 0 refills | Status: AC

## 2022-04-07 NOTE — ED Triage Notes (Signed)
Pt arrives pov, slow gait, c/o left lower back pain and flank pain today. Denies dysuria, denies shob or injury. Denies otc meds for tx

## 2022-04-07 NOTE — Discharge Instructions (Signed)
Apply Voltaren topically as prescribed.  Call your doctor's office in the morning and arrange follow-up. Can apply warm compresses to the back for 20 minutes at a time, follow with gentle stretching.

## 2022-04-07 NOTE — ED Provider Notes (Signed)
[facility] EMERGENCY DEPARTMENT Provider Note   CSN: [id number] Arrival date & time: 04/07/22  1628     History  Chief Complaint  Patient presents with   Back Pain    Erin Craig is a 61 y.o. female.  61 year old female presents with complaint of left lower back pain onset earlier today without fall or injury.  Denies changes in bowel or bladder habits, nausea, vomiting.  Pain does not radiate, is worse with movement specifically bending or twisting.  Patient is taking Zanaflex and meloxicam without improvement.  Was seen by her PCP for this pain on August 16 and notes it is not improving.  No history of kidney stones.  No other complaints or concerns today. Medical history of diabetes, hypertension, hyperlipidemia.       Home Medications Prior to Admission medications   Medication Sig Start Date End Date Taking? Authorizing Provider  diclofenac Sodium (VOLTAREN) 1 % GEL Apply 4 g topically 4 (four) times daily. 04/07/22  Yes Jeannie Fend, PA-C  aspirin EC 81 MG tablet Take 1 tablet (81 mg total) by mouth daily. Swallow whole. 12/14/21 12/14/22  Danford, Earl Lites, MD  cyanocobalamin (,VITAMIN B-12,) 1000 MCG/ML injection Inject 1,000 mcg into the muscle every 30 (thirty) days. 04/07/16   [provider]  cyclobenzaprine (FLEXERIL) 5 MG tablet Take 2 tablets (10 mg total) by mouth 2 (two) times daily as needed for muscle spasms. 06/23/21   Horton, Mayer Masker, MD  dicyclomine (BENTYL) 20 MG tablet Take 1 tablet (20 mg total) by mouth every 8 (eight) hours as needed for spasms. 08/22/21   Terrilee Files, MD  ibuprofen (ADVIL) 600 MG tablet Take 1 tablet (600 mg total) by mouth every 6 (six) hours as needed. 06/23/21   Horton, Mayer Masker, MD  meloxicam (MOBIC) 15 MG tablet TAKE 1 TABLET (15 MG TOTAL) BY MOUTH DAILY. 03/04/22   Candelaria Stagers, DPM  metFORMIN (GLUCOPHAGE) 500 MG tablet Take by mouth. 09/29/16   [provider]  omeprazole (PRILOSEC) 40 MG  capsule Take 40 mg by mouth daily. 11/09/21   [provider]  ondansetron (ZOFRAN ODT) 4 MG disintegrating tablet 4mg  ODT q4 hours prn nausea/vomit 08/22/21   08/24/21, MD  ondansetron (ZOFRAN) 4 MG tablet Take 1 tablet (4 mg total) by mouth every 8 (eight) hours as needed for nausea or vomiting. 12/01/21   Tegeler, 12/03/21, MD  oxyCODONE-acetaminophen (PERCOCET) 5-325 MG tablet Take 1 tablet by mouth every 4 (four) hours as needed for moderate pain. 06/21/21   13/12/22, MD  potassium chloride SA (KLOR-CON) 20 MEQ tablet Take 1 tablet (20 mEq total) by mouth daily. 10/22/20   Molpus, John, MD  atorvastatin (LIPITOR) 10 MG tablet Take 10 mg by mouth daily.  07/14/19  [provider]  furosemide (LASIX) 20 MG tablet Take 1 tablet (20 mg total) by mouth daily. 01/09/18 07/14/19  14/4/20, PA-C  glipiZIDE (GLUCOTROL) 5 MG tablet Take 10 mg by mouth daily before breakfast.  07/14/19  [provider]  lisinopril (PRINIVIL,ZESTRIL) 5 MG tablet Take 5 mg by mouth daily.  07/14/19  [provider]      Allergies    Patient has no known allergies.    Review of Systems   Review of Systems Negative except as per HPI Physical Exam Updated Vital Signs BP 127/72   Pulse (!) 51   Temp 98.8 F (37.1 C)   Resp 18   Ht  5\' 4"  (1.626 m)   Wt 72.6 kg   LMP  (LMP Unknown)   SpO2 100%   BMI 27.46 kg/m  Physical Exam Vitals and nursing note reviewed.  Constitutional:      General: She is not in acute distress.    Appearance: She is well-developed. She is not diaphoretic.  HENT:     Head: Normocephalic and atraumatic.  Cardiovascular:     Rate and Rhythm: Normal rate and regular rhythm.     Pulses: Normal pulses.     Heart sounds: Normal heart sounds.  Pulmonary:     Effort: Pulmonary effort is normal.     Breath sounds: Normal breath sounds.  Abdominal:     Palpations: Abdomen is soft.     Tenderness: There is no abdominal tenderness. There  is no right CVA tenderness or left CVA tenderness.  Musculoskeletal:     Lumbar back: Tenderness present. No bony tenderness.       Back:  Skin:    General: Skin is warm and dry.     Findings: No erythema or rash.  Neurological:     Mental Status: She is alert and oriented to person, place, and time.  Psychiatric:        Behavior: Behavior normal.     ED Results / Procedures / Treatments   Labs (all labs ordered are listed, but only abnormal results are displayed) Labs Reviewed  URINALYSIS, ROUTINE W REFLEX MICROSCOPIC - Abnormal; Notable for the following components:      Result Value   Hgb urine dipstick SMALL (*)    Leukocytes,Ua TRACE (*)    All other components within normal limits  URINALYSIS, MICROSCOPIC (REFLEX) - Abnormal; Notable for the following components:   Bacteria, UA RARE (*)    All other components within normal limits    EKG None  Radiology DG Lumbar Spine Complete  Result Date: 04/07/2022 CLINICAL DATA:  LEFT lower back pain EXAM: LUMBAR SPINE - COMPLETE 4+ VIEW COMPARISON:  None FINDINGS: Normal alignment of lumbar vertebral bodies. No loss of vertebral body height or disc height. No pars fracture. No subluxation. IMPRESSION: No fracture or dislocation. Electronically Signed   By: 04/09/2022 M.D.   On: 04/07/2022 18:45    Procedures Procedures    Medications Ordered in ED Medications  lidocaine (LIDODERM) 5 % 1 patch (1 patch Transdermal Patch Applied 04/07/22 1834)    ED Course/ Medical Decision Making/ A&P                           Medical Decision Making Amount and/or Complexity of Data Reviewed Labs: ordered. Radiology: ordered.  Risk Prescription drug management.   61 year old female presents with complaint of left back pain.  Patient was seen by her PCP on August 16 for same pain, given Zanaflex and meloxicam which she has been taking without improvement.  She denies falls or injuries, abdominal pain, changes in bowel or  bladder habits or history of kidney stones.  Pain is worse with movement and palpation of the area.  He is found to have tenderness just over the posterior aspect of her left iliac crest.  There is no CVA tenderness, no midline or bony tenderness.  X-ray of her lumbar spine is unremarkable.  Previous radiologist interpretation.  Patient is given a Lidoderm patch with improvement in her pain although still has pain.  Recommend use of topical Voltaren, prescription provided, advised to follow-up with  her PCP as noted in her visit to them earlier this month if her pain continued.  Prior imaging on file reviewed including CT abdomen pelvis from August 22, 2021 as well as June 21, 2021 which were both negative for any renal calculi.  Patient does not have history of kidney stones, doubt ureteral stone today.  Analysis obtained in triage without urinary symptoms does show small hemoglobin and trace leukocytes with rare bacteria.  Patient denies urinary symptoms, does not require treatment.        Final Clinical Impression(s) / ED Diagnoses Final diagnoses:  Acute left-sided low back pain without sciatica    Rx / DC Orders ED Discharge Orders          Ordered    diclofenac Sodium (VOLTAREN) 1 % GEL  4 times daily        04/07/22 1949              Jeannie Fend, PA-C 04/07/22 2304    Sloan Leiter, DO 04/08/22 0140

## 2022-04-07 NOTE — ED Notes (Addendum)
Pt reports increase pain left flank area. EDP notified

## 2022-04-07 NOTE — ED Notes (Addendum)
Pt transported to xray 

## 2022-06-09 ENCOUNTER — Emergency Department (HOSPITAL_BASED_OUTPATIENT_CLINIC_OR_DEPARTMENT_OTHER): Payer: 59 | Admitting: Radiology

## 2022-06-09 ENCOUNTER — Other Ambulatory Visit: Payer: Self-pay

## 2022-06-09 ENCOUNTER — Encounter (HOSPITAL_BASED_OUTPATIENT_CLINIC_OR_DEPARTMENT_OTHER): Payer: Self-pay | Admitting: Emergency Medicine

## 2022-06-09 ENCOUNTER — Emergency Department (HOSPITAL_BASED_OUTPATIENT_CLINIC_OR_DEPARTMENT_OTHER)
Admission: EM | Admit: 2022-06-09 | Discharge: 2022-06-09 | Disposition: A | Discharge status: Home / Self Care | Payer: 59 | Attending: Emergency Medicine | Admitting: Emergency Medicine

## 2022-06-09 DIAGNOSIS — Y990 Civilian activity done for income or pay: Secondary | ICD-10-CM | POA: Diagnosis not present

## 2022-06-09 DIAGNOSIS — Z7984 Long term (current) use of oral hypoglycemic drugs: Secondary | ICD-10-CM | POA: Insufficient documentation

## 2022-06-09 DIAGNOSIS — M25511 Pain in right shoulder: Secondary | ICD-10-CM | POA: Insufficient documentation

## 2022-06-09 DIAGNOSIS — X503XXA Overexertion from repetitive movements, initial encounter: Secondary | ICD-10-CM | POA: Diagnosis not present

## 2022-06-09 DIAGNOSIS — Z7982 Long term (current) use of aspirin: Secondary | ICD-10-CM | POA: Diagnosis not present

## 2022-06-09 MED ORDER — NAPROXEN 250 MG PO TABS
500.0000 mg | ORAL_TABLET | Freq: Once | ORAL | Status: AC
  Administered 2022-06-09: 500 mg via ORAL
  Filled 2022-06-09: qty 2

## 2022-06-09 NOTE — ED Triage Notes (Addendum)
Pt states that she believes that her shoulder is dislocated based on having pain in R shoulder after lifting a heavy box for her job at the [employer] office on Wednesday.  No obvious deformity noted, sensation intact, unable to lift arm overhead.   HR 46 noted in triage confirmed with manual, pt states she is typically in low 50s. PMH of bradycardia listed in chart

## 2022-06-09 NOTE — ED Provider Notes (Signed)
[facility] EMERGENCY DEPT  Provider Note  CSN: [id number] Arrival date & time: 06/09/22 0502  History Chief Complaint  Patient presents with   Shoulder Pain    Erin Craig is a 61 y.o. female reports she works at the [employer] and injured her R shoulder lifting boxes about 6 days ago. Has had persistent R shoulder pain since then but has been able to continue working. She has not seen anyone for this pain yet. Thinks it may be dislocated.    Home Medications Prior to Admission medications   Medication Sig Start Date End Date Taking? Authorizing Provider  aspirin EC 81 MG tablet Take 1 tablet (81 mg total) by mouth daily. Swallow whole. 12/14/21 12/14/22  Danford, Earl Lites, MD  cyanocobalamin (,VITAMIN B-12,) 1000 MCG/ML injection Inject 1,000 mcg into the muscle every 30 (thirty) days. 04/07/16   [provider]  cyclobenzaprine (FLEXERIL) 5 MG tablet Take 2 tablets (10 mg total) by mouth 2 (two) times daily as needed for muscle spasms. 06/23/21   Horton, Mayer Masker, MD  diclofenac Sodium (VOLTAREN) 1 % GEL Apply 4 g topically 4 (four) times daily. 04/07/22   Jeannie Fend, PA-C  dicyclomine (BENTYL) 20 MG tablet Take 1 tablet (20 mg total) by mouth every 8 (eight) hours as needed for spasms. 08/22/21   Terrilee Files, MD  ibuprofen (ADVIL) 600 MG tablet Take 1 tablet (600 mg total) by mouth every 6 (six) hours as needed. 06/23/21   Horton, Mayer Masker, MD  meloxicam (MOBIC) 15 MG tablet TAKE 1 TABLET (15 MG TOTAL) BY MOUTH DAILY. 03/04/22   Candelaria Stagers, DPM  metFORMIN (GLUCOPHAGE) 500 MG tablet Take by mouth. 09/29/16   [provider]  omeprazole (PRILOSEC) 40 MG capsule Take 40 mg by mouth daily. 11/09/21   [provider]  ondansetron (ZOFRAN ODT) 4 MG disintegrating tablet 4mg  ODT q4 hours prn nausea/vomit 08/22/21   08/24/21, MD  ondansetron (ZOFRAN) 4 MG tablet Take 1 tablet (4 mg total) by mouth every 8 (eight) hours as  needed for nausea or vomiting. 12/01/21   Tegeler, 12/03/21, MD  oxyCODONE-acetaminophen (PERCOCET) 5-325 MG tablet Take 1 tablet by mouth every 4 (four) hours as needed for moderate pain. 06/21/21   13/12/22, MD  potassium chloride SA (KLOR-CON) 20 MEQ tablet Take 1 tablet (20 mEq total) by mouth daily. 10/22/20   Molpus, John, MD  atorvastatin (LIPITOR) 10 MG tablet Take 10 mg by mouth daily.  07/14/19  [provider]  furosemide (LASIX) 20 MG tablet Take 1 tablet (20 mg total) by mouth daily. 01/09/18 07/14/19  14/4/20, PA-C  glipiZIDE (GLUCOTROL) 5 MG tablet Take 10 mg by mouth daily before breakfast.  07/14/19  [provider]  lisinopril (PRINIVIL,ZESTRIL) 5 MG tablet Take 5 mg by mouth daily.  07/14/19  [provider]     Allergies    Patient has no known allergies.   Review of Systems   Review of Systems Please see HPI for pertinent positives and negatives  Physical Exam BP 107/61 (BP Location: Right Arm)   Pulse (!) 46   Temp 98 F (36.7 C) (Oral)   Resp 18   Wt 73.9 kg   LMP  (LMP Unknown)   SpO2 99%   BMI 27.98 kg/m   Physical Exam Vitals and nursing note reviewed.  HENT:     Head: Normocephalic.     Nose: Nose normal.  Eyes:  Extraocular Movements: Extraocular movements intact.  Pulmonary:     Effort: Pulmonary effort is normal.  Musculoskeletal:        General: No swelling, tenderness or deformity. Normal range of motion.     Cervical back: Neck supple.  Skin:    Findings: No rash (on exposed skin).  Neurological:     Mental Status: She is alert and oriented to person, place, and time.  Psychiatric:        Mood and Affect: Mood normal.     ED Results / Procedures / Treatments   EKG None  Procedures Procedures  Medications Ordered in the ED Medications  naproxen (NAPROSYN) tablet 500 mg (has no administration in time range)    Initial Impression and Plan  Patient here with R Shoulder pain for about  a week. She has a benign exam. She was sent for xrays but inadvertently imaged the incorrect side. Was taken back for correct images which are pending. Doubt fracture or dislocation by exam.   ED Course   Clinical Course as of 06/09/22 0711  Tue Jun 09, 2022  0707 I personally viewed the images from radiology studies and agree with radiologist interpretation:  Xrays neg. Plan sling, NSAIDs and ortho follow up if not improving.  [CS]    Clinical Course User Index [CS] Truddie Hidden, MD     MDM Rules/Calculators/A&P Medical Decision Making Problems Addressed: Acute pain of right shoulder: acute illness or injury  Amount and/or Complexity of Data Reviewed Radiology: ordered and independent interpretation performed. Decision-making details documented in ED Course.  Risk Prescription drug management.    Final Clinical Impression(s) / ED Diagnoses Final diagnoses:  Acute pain of right shoulder    Rx / DC Orders ED Discharge Orders     None        Truddie Hidden, MD 06/09/22 [phone]

## 2022-07-15 ENCOUNTER — Other Ambulatory Visit: Payer: 59

## 2022-08-12 ENCOUNTER — Other Ambulatory Visit: Payer: 59

## 2022-10-05 ENCOUNTER — Ambulatory Visit (INDEPENDENT_AMBULATORY_CARE_PROVIDER_SITE_OTHER): Payer: 59 | Admitting: Podiatry

## 2022-10-05 ENCOUNTER — Encounter: Payer: Self-pay | Admitting: Podiatry

## 2022-10-05 DIAGNOSIS — E1165 Type 2 diabetes mellitus with hyperglycemia: Secondary | ICD-10-CM

## 2022-10-05 DIAGNOSIS — L84 Corns and callosities: Secondary | ICD-10-CM | POA: Diagnosis not present

## 2022-10-05 DIAGNOSIS — Q828 Other specified congenital malformations of skin: Secondary | ICD-10-CM

## 2022-10-05 NOTE — Progress Notes (Signed)
  Subjective:  Patient ID: Erin Craig, female    DOB: [birth year],   MRN: [id number]  Chief Complaint  Patient presents with   Callouses     Left foot corn trim    Foot Orthotics    PUO    62 y.o. female presents for for follow-up of concern of callus on her right foot that has been acting up and starting to cause pain and wanted it trimmed again. Here to pick up orthotics as well.  She is diabetic and Last A1c was 6.0.  Denies any other pedal complaints. Denies n/v/f/c.   Past Medical History:  Diagnosis Date   Bradycardia    Diabetes mellitus without complication (HCC)    Heart murmur    Hypercholesterolemia    Hypertension     Objective:  Physical Exam: Vascular: DP/PT pulses 2/4 bilateral. CFT <3 seconds. Normal hair growth on digits. No edema.  Skin. No lacerations or abrasions bilateral feet. Hyperkeratotic lesion noted to the lateral fifth metatarsal on the left.  Musculoskeletal: MMT 5/5 bilateral lower extremities in DF, PF, Inversion and Eversion. Deceased ROM in DF of ankle joint. No pain over lateral ankle joint line. And no pain over ATFL. Negative anterior drawer signs. No pain along peroneal tendons. No pain medial around angle. No pain in achilles. No pain with DF, PF and no pain with STJ ROM.  Neurological: Sensation intact to light touch.   Assessment:   1. Callus of foot   2. Porokeratosis   3. Type 2 diabetes mellitus with hyperglycemia, without long-term current use of insulin (HCC)       Plan:  Patient was evaluated and treated and all questions answered. -Discussed corns and calluses with patient and treatment options.  -Hyperkeratotic tissue was debrided with chisel without incident as courtesy.  -Encouraged daily moisturizing -Picked up orthotics and instructed on use.  -Discussed use of pumice stone -Advised good supportive shoes and inserts -Patient to return to office as needed or sooner if condition worsens.    Lorenda Peck, DPM

## 2022-10-14 ENCOUNTER — Emergency Department (HOSPITAL_BASED_OUTPATIENT_CLINIC_OR_DEPARTMENT_OTHER)
Admission: EM | Admit: 2022-10-14 | Discharge: 2022-10-14 | Disposition: A | Discharge status: Home / Self Care | Payer: 59 | Attending: Emergency Medicine | Admitting: Emergency Medicine

## 2022-10-14 ENCOUNTER — Encounter (HOSPITAL_BASED_OUTPATIENT_CLINIC_OR_DEPARTMENT_OTHER): Payer: Self-pay | Admitting: Pediatrics

## 2022-10-14 ENCOUNTER — Other Ambulatory Visit: Payer: Self-pay

## 2022-10-14 DIAGNOSIS — R1084 Generalized abdominal pain: Secondary | ICD-10-CM | POA: Insufficient documentation

## 2022-10-14 DIAGNOSIS — R001 Bradycardia, unspecified: Secondary | ICD-10-CM | POA: Insufficient documentation

## 2022-10-14 DIAGNOSIS — Z7982 Long term (current) use of aspirin: Secondary | ICD-10-CM | POA: Diagnosis not present

## 2022-10-14 DIAGNOSIS — R197 Diarrhea, unspecified: Secondary | ICD-10-CM

## 2022-10-14 LAB — CBC
HCT: 42.2 % (ref 36.0–46.0)
Hemoglobin: 13.8 g/dL (ref 12.0–15.0)
MCH: 31.1 pg (ref 26.0–34.0)
MCHC: 32.7 g/dL (ref 30.0–36.0)
MCV: 95 fL (ref 80.0–100.0)
Platelets: 188 10*3/uL (ref 150–400)
RBC: 4.44 MIL/uL (ref 3.87–5.11)
RDW: 12.7 % (ref 11.5–15.5)
WBC: 6.3 10*3/uL (ref 4.0–10.5)
nRBC: 0 % (ref 0.0–0.2)

## 2022-10-14 LAB — URINALYSIS, ROUTINE W REFLEX MICROSCOPIC
Bilirubin Urine: NEGATIVE
Glucose, UA: NEGATIVE mg/dL
Ketones, ur: NEGATIVE mg/dL
Nitrite: NEGATIVE
Protein, ur: NEGATIVE mg/dL
Specific Gravity, Urine: 1.01 (ref 1.005–1.030)
pH: 6 (ref 5.0–8.0)

## 2022-10-14 LAB — COMPREHENSIVE METABOLIC PANEL
ALT: 16 U/L (ref 0–44)
AST: 26 U/L (ref 15–41)
Albumin: 3.5 g/dL (ref 3.5–5.0)
Alkaline Phosphatase: 63 U/L (ref 38–126)
Anion gap: 4 — ABNORMAL LOW (ref 5–15)
BUN: 13 mg/dL (ref 8–23)
CO2: 21 mmol/L — ABNORMAL LOW (ref 22–32)
Calcium: 8.4 mg/dL — ABNORMAL LOW (ref 8.9–10.3)
Chloride: 111 mmol/L (ref 98–111)
Creatinine, Ser: 0.58 mg/dL (ref 0.44–1.00)
GFR, Estimated: >60 mL/min (ref >60)
Glucose, Bld: 105 mg/dL — ABNORMAL HIGH (ref 70–99)
Potassium: 3.6 mmol/L (ref 3.5–5.1)
Sodium: 136 mmol/L (ref 135–145)
Total Bilirubin: 0.9 mg/dL (ref 0.3–1.2)
Total Protein: 6.5 g/dL (ref 6.5–8.1)

## 2022-10-14 LAB — URINALYSIS, MICROSCOPIC (REFLEX)

## 2022-10-14 LAB — LIPASE, BLOOD: Lipase: 41 U/L (ref 11–51)

## 2022-10-14 MED ORDER — ONDANSETRON HCL 4 MG/2ML IJ SOLN
4.0000 mg | Freq: Once | INTRAMUSCULAR | Status: AC
  Administered 2022-10-14: 4 mg via INTRAVENOUS
  Filled 2022-10-14: qty 2

## 2022-10-14 MED ORDER — MORPHINE SULFATE (PF) 4 MG/ML IV SOLN
4.0000 mg | Freq: Once | INTRAVENOUS | Status: AC
  Administered 2022-10-14: 4 mg via INTRAVENOUS
  Filled 2022-10-14: qty 1

## 2022-10-14 MED ORDER — ONDANSETRON 4 MG PO TBDP: ORAL_TABLET | ORAL | 0 refills | Status: DC

## 2022-10-14 MED ORDER — SODIUM CHLORIDE 0.9 % IV BOLUS
1000.0000 mL | Freq: Once | INTRAVENOUS | Status: AC
  Administered 2022-10-14: 1000 mL via INTRAVENOUS

## 2022-10-14 NOTE — ED Notes (Signed)
Pt asked to provide urine sample

## 2022-10-14 NOTE — ED Triage Notes (Signed)
C/O mid epigastric pain since yesterday, throbbing in nature. LBM today, denies NV;

## 2022-10-14 NOTE — ED Notes (Signed)
Patient ambulatory to restroom  ?

## 2022-10-14 NOTE — ED Provider Notes (Signed)
[place] EMERGENCY DEPARTMENT AT [place] [facility] Provider Note   CSN: [id number] Arrival date & time: 10/14/22  1650     History  Chief Complaint  Patient presents with   Abdominal Pain    Erin Craig is a 62 y.o. female.  62 yo F with a chief complaint of diffuse crampy abdominal pain.  This been going on since yesterday.  She initially denied diarrhea but then said when she eats she has to go move her bowels.  She denies any vomiting denies fevers.  No known sick contacts.  No suspicious food intake.  Denies dark or bloody stool.   Abdominal Pain      Home Medications Prior to Admission medications   Medication Sig Start Date End Date Taking? Authorizing Provider  ondansetron (ZOFRAN-ODT) 4 MG disintegrating tablet '4mg'$  ODT q4 hours prn nausea/vomit 10/14/22  Yes Deno Etienne, DO  aspirin EC 81 MG tablet Take 1 tablet (81 mg total) by mouth daily. Swallow whole. 12/14/21 12/14/22  Danford, Suann Larry, MD  cyanocobalamin (,VITAMIN B-12,) 1000 MCG/ML injection Inject 1,000 mcg into the muscle every 30 (thirty) days. 04/07/16   [provider]  cyclobenzaprine (FLEXERIL) 5 MG tablet Take 2 tablets (10 mg total) by mouth 2 (two) times daily as needed for muscle spasms. 06/23/21   Horton, Barbette Hair, MD  diclofenac Sodium (VOLTAREN) 1 % GEL Apply 4 g topically 4 (four) times daily. 04/07/22   Tacy Learn, PA-C  dicyclomine (BENTYL) 20 MG tablet Take 1 tablet (20 mg total) by mouth every 8 (eight) hours as needed for spasms. 08/22/21   Hayden Rasmussen, MD  ibuprofen (ADVIL) 600 MG tablet Take 1 tablet (600 mg total) by mouth every 6 (six) hours as needed. 06/23/21   Horton, Barbette Hair, MD  meloxicam (MOBIC) 15 MG tablet TAKE 1 TABLET (15 MG TOTAL) BY MOUTH DAILY. 03/04/22   Felipa Furnace, DPM  metFORMIN (GLUCOPHAGE) 500 MG tablet Take by mouth. 09/29/16   [provider]  omeprazole (PRILOSEC) 40 MG capsule Take 40 mg by mouth daily. 11/09/21   [provider]  ondansetron (ZOFRAN) 4 MG tablet Take 1 tablet (4 mg total) by mouth every 8 (eight) hours as needed for nausea or vomiting. 12/01/21   Tegeler, Gwenyth Allegra, MD  oxyCODONE-acetaminophen (PERCOCET) 5-325 MG tablet Take 1 tablet by mouth every 4 (four) hours as needed for moderate pain. [id number]   Delora Fuel, MD  potassium chloride SA (KLOR-CON) 20 MEQ tablet Take 1 tablet (20 mEq total) by mouth daily. 10/22/20   Molpus, John, MD  atorvastatin (LIPITOR) 10 MG tablet Take 10 mg by mouth daily.  07/14/19  [provider]  furosemide (LASIX) 20 MG tablet Take 1 tablet (20 mg total) by mouth daily. 01/09/18 07/14/19  Recardo Evangelist, PA-C  glipiZIDE (GLUCOTROL) 5 MG tablet Take 10 mg by mouth daily before breakfast.  07/14/19  [provider]  lisinopril (PRINIVIL,ZESTRIL) 5 MG tablet Take 5 mg by mouth daily.  07/14/19  [provider]      Allergies    Patient has no known allergies.    Review of Systems   Review of Systems  Gastrointestinal:  Positive for abdominal pain.    Physical Exam Updated Vital Signs Pulse (!) 56   Ht '5\' 4"'$  (1.626 m)   Wt 78 kg   LMP  (LMP Unknown)   SpO2 (!) 84%   BMI 29.52 kg/m  Physical Exam Vitals and nursing note  reviewed.  Constitutional:      General: She is not in acute distress.    Appearance: She is well-developed. She is not diaphoretic.  HENT:     Head: Normocephalic and atraumatic.  Eyes:     Pupils: Pupils are equal, round, and reactive to light.  Cardiovascular:     Rate and Rhythm: Normal rate and regular rhythm.     Heart sounds: No murmur heard.    No friction rub. No gallop.  Pulmonary:     Effort: Pulmonary effort is normal.     Breath sounds: No wheezing or rales.  Abdominal:     General: There is no distension.     Palpations: Abdomen is soft.     Tenderness: There is no abdominal tenderness.     Comments: Mild diffuse pain throughout the abdomen without obvious focality   Musculoskeletal:        General: No tenderness.     Cervical back: Normal range of motion and neck supple.  Skin:    General: Skin is warm and dry.  Neurological:     Mental Status: She is alert and oriented to person, place, and time.  Psychiatric:        Behavior: Behavior normal.     ED Results / Procedures / Treatments   Labs (all labs ordered are listed, but only abnormal results are displayed) Labs Reviewed  COMPREHENSIVE METABOLIC PANEL - Abnormal; Notable for the following components:      Result Value   CO2 21 (*)    Glucose, Bld 105 (*)    Calcium 8.4 (*)    Anion gap 4 (*)    All other components within normal limits  URINALYSIS, ROUTINE W REFLEX MICROSCOPIC - Abnormal; Notable for the following components:   Color, Urine STRAW (*)    Hgb urine dipstick TRACE (*)    Leukocytes,Ua SMALL (*)    All other components within normal limits  URINALYSIS, MICROSCOPIC (REFLEX) - Abnormal; Notable for the following components:   Bacteria, UA RARE (*)    All other components within normal limits  LIPASE, BLOOD  CBC    EKG None  Radiology No results found.  Procedures Procedures    Medications Ordered in ED Medications  sodium chloride 0.9 % bolus 1,000 mL (1,000 mLs Intravenous New Bag/Given 10/14/22 1746)  morphine (PF) 4 MG/ML injection 4 mg (4 mg Intravenous Given 10/14/22 1745)  ondansetron (ZOFRAN) injection 4 mg (4 mg Intravenous Given 10/14/22 1744)    ED Course/ Medical Decision Making/ A&P                             Medical Decision Making Amount and/or Complexity of Data Reviewed Labs: ordered.  Risk Prescription drug management.   62 yo F with a chief complaints of diarrhea and diffuse abdominal discomfort.  This been going on since yesterday.  She is well-appearing nontoxic.  Nonfocal abdominal exam.  Will treat pain and nausea bolus of IV fluids check blood work UA reassess.  Patient feeling better on repeat assessment.  LFT and lipase are  unremarkable.  No leukocytosis.  UA negative for infection is independently interpreted by me.  Will treat with Imodium and Zofran.  PCP follow-up.  Of note the only vital signs that are currently documented show hypoxia bradycardia.  The patient is having no acute distress I think these are likely an error.  Repeated.  Discharge.  7:24 PM:  I  have discussed the diagnosis/risks/treatment options with the patient.  Evaluation and diagnostic testing in the emergency department does not suggest an emergent condition requiring admission or immediate intervention beyond what has been performed at this time.  They will follow up with PCP. We also discussed returning to the ED immediately if new or worsening sx occur. We discussed the sx which are most concerning (e.g., sudden worsening pain, fever, inability to tolerate by mouth) that necessitate immediate return. Medications administered to the patient during their visit and any new prescriptions provided to the patient are listed below.  Medications given during this visit Medications  sodium chloride 0.9 % bolus 1,000 mL (1,000 mLs Intravenous New Bag/Given 10/14/22 1746)  morphine (PF) 4 MG/ML injection 4 mg (4 mg Intravenous Given 10/14/22 1745)  ondansetron (ZOFRAN) injection 4 mg (4 mg Intravenous Given 10/14/22 1744)     The patient appears reasonably screen and/or stabilized for discharge and I doubt any other medical condition or other [facility] requiring further screening, evaluation, or treatment in the ED at this time prior to discharge.          Final Clinical Impression(s) / ED Diagnoses Final diagnoses:  Diarrhea, unspecified type    Rx / DC Orders ED Discharge Orders          Ordered    ondansetron (ZOFRAN-ODT) 4 MG disintegrating tablet        10/14/22 1918              Deno Etienne, DO 10/14/22 1924

## 2022-10-14 NOTE — ED Notes (Signed)
Pt asked to urinate but she denies being able to go at this time.

## 2022-10-14 NOTE — ED Notes (Signed)
RN attempted x 3 to get IV. RT to try with [place].

## 2022-10-14 NOTE — Discharge Instructions (Signed)
Take imodium for diarrhea.  Follow up with your family doc in the office. Return for worsening pain, fever inability to eat or drink.

## 2022-11-30 ENCOUNTER — Ambulatory Visit: Payer: 59 | Admitting: Podiatry

## 2022-12-04 ENCOUNTER — Ambulatory Visit: Payer: 59 | Admitting: Podiatry

## 2022-12-13 ENCOUNTER — Other Ambulatory Visit: Payer: Self-pay

## 2022-12-13 ENCOUNTER — Emergency Department (HOSPITAL_BASED_OUTPATIENT_CLINIC_OR_DEPARTMENT_OTHER)
Admission: EM | Admit: 2022-12-13 | Discharge: 2022-12-13 | Disposition: A | Discharge status: Home / Self Care | Payer: 59 | Attending: Emergency Medicine | Admitting: Emergency Medicine

## 2022-12-13 ENCOUNTER — Encounter (HOSPITAL_BASED_OUTPATIENT_CLINIC_OR_DEPARTMENT_OTHER): Payer: Self-pay | Admitting: Emergency Medicine

## 2022-12-13 DIAGNOSIS — R1013 Epigastric pain: Secondary | ICD-10-CM | POA: Insufficient documentation

## 2022-12-13 DIAGNOSIS — E119 Type 2 diabetes mellitus without complications: Secondary | ICD-10-CM | POA: Insufficient documentation

## 2022-12-13 DIAGNOSIS — Z7984 Long term (current) use of oral hypoglycemic drugs: Secondary | ICD-10-CM | POA: Diagnosis not present

## 2022-12-13 DIAGNOSIS — R197 Diarrhea, unspecified: Secondary | ICD-10-CM | POA: Insufficient documentation

## 2022-12-13 LAB — CBC WITH DIFFERENTIAL/PLATELET
Abs Immature Granulocytes: 0.01 10*3/uL (ref 0.00–0.07)
Basophils Absolute: 0 10*3/uL (ref 0.0–0.1)
Basophils Relative: 0 %
Eosinophils Absolute: 0.1 10*3/uL (ref 0.0–0.5)
Eosinophils Relative: 2 %
HCT: 40.4 % (ref 36.0–46.0)
Hemoglobin: 13 g/dL (ref 12.0–15.0)
Immature Granulocytes: 0 %
Lymphocytes Relative: 37 %
Lymphs Abs: 2.2 10*3/uL (ref 0.7–4.0)
MCH: 31 pg (ref 26.0–34.0)
MCHC: 32.2 g/dL (ref 30.0–36.0)
MCV: 96.4 fL (ref 80.0–100.0)
Monocytes Absolute: 0.5 10*3/uL (ref 0.1–1.0)
Monocytes Relative: 9 %
Neutro Abs: 3.1 10*3/uL (ref 1.7–7.7)
Neutrophils Relative %: 52 %
Platelets: 181 10*3/uL (ref 150–400)
RBC: 4.19 MIL/uL (ref 3.87–5.11)
RDW: 13.5 % (ref 11.5–15.5)
WBC: 5.9 10*3/uL (ref 4.0–10.5)
nRBC: 0 % (ref 0.0–0.2)

## 2022-12-13 LAB — COMPREHENSIVE METABOLIC PANEL
ALT: 16 U/L (ref 0–44)
AST: 18 U/L (ref 15–41)
Albumin: 3.3 g/dL — ABNORMAL LOW (ref 3.5–5.0)
Alkaline Phosphatase: 65 U/L (ref 38–126)
Anion gap: 7 (ref 5–15)
BUN: 14 mg/dL (ref 8–23)
CO2: 24 mmol/L (ref 22–32)
Calcium: 8.5 mg/dL — ABNORMAL LOW (ref 8.9–10.3)
Chloride: 105 mmol/L (ref 98–111)
Creatinine, Ser: 0.67 mg/dL (ref 0.44–1.00)
GFR, Estimated: >60 mL/min (ref >60)
Glucose, Bld: 102 mg/dL — ABNORMAL HIGH (ref 70–99)
Potassium: 3.6 mmol/L (ref 3.5–5.1)
Sodium: 136 mmol/L (ref 135–145)
Total Bilirubin: 0.5 mg/dL (ref 0.3–1.2)
Total Protein: 6.5 g/dL (ref 6.5–8.1)

## 2022-12-13 LAB — URINALYSIS, ROUTINE W REFLEX MICROSCOPIC
Bilirubin Urine: NEGATIVE
Glucose, UA: NEGATIVE mg/dL
Hgb urine dipstick: NEGATIVE
Ketones, ur: NEGATIVE mg/dL
Nitrite: NEGATIVE
Protein, ur: NEGATIVE mg/dL
Specific Gravity, Urine: 1.01 (ref 1.005–1.030)
pH: 6.5 (ref 5.0–8.0)

## 2022-12-13 LAB — URINALYSIS, MICROSCOPIC (REFLEX): RBC / HPF: NONE SEEN RBC/hpf (ref 0–5)

## 2022-12-13 LAB — LIPASE, BLOOD: Lipase: 36 U/L (ref 11–51)

## 2022-12-13 MED ORDER — FAMOTIDINE IN NACL 20-0.9 MG/50ML-% IV SOLN
20.0000 mg | Freq: Once | INTRAVENOUS | Status: AC
  Administered 2022-12-13: 20 mg via INTRAVENOUS
  Filled 2022-12-13: qty 50

## 2022-12-13 MED ORDER — SODIUM CHLORIDE 0.9 % IV BOLUS
1000.0000 mL | Freq: Once | INTRAVENOUS | Status: AC
  Administered 2022-12-13: 1000 mL via INTRAVENOUS

## 2022-12-13 MED ORDER — ALUM & MAG HYDROXIDE-SIMETH 200-200-20 MG/5ML PO SUSP
30.0000 mL | Freq: Once | ORAL | Status: AC
  Administered 2022-12-13: 30 mL via ORAL
  Filled 2022-12-13: qty 30

## 2022-12-13 NOTE — Discharge Instructions (Addendum)
Your workup today was reassuring for no signs of acute abnormalities in your labs.  I recommend trying a course of Pepcid over-the-counter to see if it may help alleviate some of your symptoms.  Please contact gastroenterology to schedule a follow-up appointment for further evaluation and management.  If you develop any life-threatening symptoms please return to the emergency department.

## 2022-12-13 NOTE — ED Notes (Signed)
Attempted IV access x 2 , blood obtained for labs. Tol well . Medic to use U/S for IV access

## 2022-12-13 NOTE — ED Triage Notes (Signed)
Pt reports abd pain. Says every time she eats she has to have a bowel movement. Diarrhea last week but none today. 1 episode of emesis this am.

## 2022-12-13 NOTE — ED Provider Notes (Signed)
[place] [facility] Provider Note   CSN: [id number] Arrival date & time: 12/13/22  1041     History  Chief Complaint  Patient presents with   Abdominal Pain    Erin Craig is a 62 y.o. female.  Patient with past medical history significant for type II DM, cholecystectomy presents to the emergency department complaining of epigastric abdominal pain.  Patient states that every time she eats she reports that she has to have a bowel movement which is loose, typically within an hour of eating.  She also endorses increased gas and bloating.  She does endorse 1 episode of emesis this morning.  She denies chest pain, shortness of breath, fevers, headache, urinary symptoms, vaginal discharge.  The patient reports that she has been evaluated at the emergency department, gastroenterology, and her primary care for the same.  She states they have found no definitive cause of her discomfort.  She has been prescribed Bentyl, Zofran.  She also takes over-the-counter Prilosec.  HPI     Home Medications Prior to Admission medications   Medication Sig Start Date End Date Taking? Authorizing Provider  aspirin EC 81 MG tablet Take 1 tablet (81 mg total) by mouth daily. Swallow whole. 12/14/21 12/14/22  Danford, Earl Lites, MD  cyanocobalamin (,VITAMIN B-12,) 1000 MCG/ML injection Inject 1,000 mcg into the muscle every 30 (thirty) days. 04/07/16   [provider]  cyclobenzaprine (FLEXERIL) 5 MG tablet Take 2 tablets (10 mg total) by mouth 2 (two) times daily as needed for muscle spasms. 06/23/21   Horton, Mayer Masker, MD  diclofenac Sodium (VOLTAREN) 1 % GEL Apply 4 g topically 4 (four) times daily. 04/07/22   Jeannie Fend, PA-C  dicyclomine (BENTYL) 20 MG tablet Take 1 tablet (20 mg total) by mouth every 8 (eight) hours as needed for spasms. 08/22/21   Terrilee Files, MD  ibuprofen (ADVIL) 600 MG tablet Take 1 tablet (600 mg total) by mouth every 6 (six)  hours as needed. 06/23/21   Horton, Mayer Masker, MD  meloxicam (MOBIC) 15 MG tablet TAKE 1 TABLET (15 MG TOTAL) BY MOUTH DAILY. 03/04/22   Candelaria Stagers, DPM  metFORMIN (GLUCOPHAGE) 500 MG tablet Take by mouth. 09/29/16   [provider]  omeprazole (PRILOSEC) 40 MG capsule Take 40 mg by mouth daily. 11/09/21   [provider]  ondansetron (ZOFRAN) 4 MG tablet Take 1 tablet (4 mg total) by mouth every 8 (eight) hours as needed for nausea or vomiting. 12/01/21   Tegeler, Canary Brim, MD  ondansetron (ZOFRAN-ODT) 4 MG disintegrating tablet 4mg  ODT q4 hours prn nausea/vomit 10/14/22   Melene Plan, DO  oxyCODONE-acetaminophen (PERCOCET) 5-325 MG tablet Take 1 tablet by mouth every 4 (four) hours as needed for moderate pain. 06/21/21   Dione Booze, MD  potassium chloride SA (KLOR-CON) 20 MEQ tablet Take 1 tablet (20 mEq total) by mouth daily. 10/22/20   Molpus, John, MD  atorvastatin (LIPITOR) 10 MG tablet Take 10 mg by mouth daily.  07/14/19  [provider]  furosemide (LASIX) 20 MG tablet Take 1 tablet (20 mg total) by mouth daily. 01/09/18 07/14/19  Bethel Born, PA-C  glipiZIDE (GLUCOTROL) 5 MG tablet Take 10 mg by mouth daily before breakfast.  07/14/19  [provider]  lisinopril (PRINIVIL,ZESTRIL) 5 MG tablet Take 5 mg by mouth daily.  07/14/19  [provider]      Allergies    Patient has no known allergies.  Review of Systems   Review of Systems  Physical Exam Updated Vital Signs BP 110/63   Pulse (!) 49   Temp 98.1 F (36.7 C) (Oral)   Resp 17   Ht 5\' 4"  (1.626 m)   Wt 76.7 kg   LMP  (LMP Unknown)   SpO2 99%   BMI 29.01 kg/m  Physical Exam Vitals and nursing note reviewed.  Constitutional:      General: She is not in acute distress.    Appearance: She is well-developed.  HENT:     Head: Normocephalic and atraumatic.  Eyes:     Conjunctiva/sclera: Conjunctivae normal.  Cardiovascular:     Rate and Rhythm: Normal rate and  regular rhythm.     Heart sounds: No murmur heard. Pulmonary:     Effort: Pulmonary effort is normal. No respiratory distress.     Breath sounds: Normal breath sounds.  Abdominal:     Palpations: Abdomen is soft.     Tenderness: There is no abdominal tenderness.     Comments: Abdomen is mildly distended with normal bowel sounds and no focal tenderness.  Musculoskeletal:        General: No swelling.     Cervical back: Neck supple.  Skin:    General: Skin is warm and dry.     Capillary Refill: Capillary refill takes less than 2 seconds.  Neurological:     Mental Status: She is alert.  Psychiatric:        Mood and Affect: Mood normal.     ED Results / Procedures / Treatments   Labs (all labs ordered are listed, but only abnormal results are displayed) Labs Reviewed  URINALYSIS, ROUTINE W REFLEX MICROSCOPIC - Abnormal; Notable for the following components:      Result Value   Color, Urine STRAW (*)    Leukocytes,Ua SMALL (*)    All other components within normal limits  COMPREHENSIVE METABOLIC PANEL - Abnormal; Notable for the following components:   Glucose, Bld 102 (*)    Calcium 8.5 (*)    Albumin 3.3 (*)    All other components within normal limits  URINALYSIS, MICROSCOPIC (REFLEX) - Abnormal; Notable for the following components:   Bacteria, UA RARE (*)    All other components within normal limits  CBC WITH DIFFERENTIAL/PLATELET  LIPASE, BLOOD    EKG None  Radiology No results found.  Procedures Procedures    Medications Ordered in ED Medications  famotidine (PEPCID) IVPB 20 mg premix (0 mg Intravenous Stopped 12/13/22 1230)  alum & mag hydroxide-simeth (MAALOX/MYLANTA) 200-200-20 MG/5ML suspension 30 mL (30 mLs Oral Given 12/13/22 1147)  sodium chloride 0.9 % bolus 1,000 mL (1,000 mLs Intravenous New Bag/Given 12/13/22 1141)    ED Course/ Medical Decision Making/ A&P                             Medical Decision Making Amount and/or Complexity of Data  Reviewed Labs: ordered.  Risk OTC drugs. Prescription drug management.   This patient presents to the ED for concern of abdominal pain, this involves an extensive number of treatment options, and is a complaint that carries with it a high risk of complications and morbidity.  The differential diagnosis includes colitis, appendicitis, bowel obstruction, others   Co morbidities that complicate the patient evaluation  Type II DM   Additional history obtained:   External records from outside source obtained and reviewed including family medicine notes from earlier  in the week where the patient was prescribed Bentyl   Lab Tests:  I Ordered, and personally interpreted labs.  The pertinent results include: UA with small leukocytes, rare bacteria; CMP showing albumin 3.3, unremarkable CBC, unremarkable lipase   Imaging Studies ordered:  Patient with no acute abdominal tenderness.  No signs of surgical/acute abdomen at this time.  No indication for abdominal imaging at this time  Problem List / ED Course / Critical interventions / Medication management   I ordered medication including Pepcid and Maalox for reflux-like symptoms Reevaluation of the patient after these medicines showed that the patient improved I have reviewed the patients home medicines and have made adjustments as needed   Test / Admission - Considered:  Patient feels better after GI cocktail.  Feel the patient's pain may be related to reflux/acid related symptoms.  Unclear etiology of patient's frequent bowel movements and bloating.  Patient request new referral to gastroenterology.  Will provide referral.  Recommend trying course of Pepcid over-the-counter to see if it helps alleviate some symptoms.  No signs of acute abdomen at this time.  Patient is nonseptic.  No indication for admission.  Discharge home         Final Clinical Impression(s) / ED Diagnoses Final diagnoses:  Epigastric pain  Diarrhea,  unspecified type    Rx / DC Orders ED Discharge Orders     None         Pamala Duffel 12/13/22 1329    Glynn Octave, MD 12/13/22 1542

## 2022-12-16 ENCOUNTER — Ambulatory Visit: Payer: 59 | Admitting: Podiatry

## 2022-12-28 ENCOUNTER — Emergency Department (HOSPITAL_BASED_OUTPATIENT_CLINIC_OR_DEPARTMENT_OTHER)
Admission: EM | Admit: 2022-12-28 | Discharge: 2022-12-28 | Disposition: A | Discharge status: Home / Self Care | Payer: 59 | Attending: Emergency Medicine | Admitting: Emergency Medicine

## 2022-12-28 ENCOUNTER — Emergency Department (HOSPITAL_BASED_OUTPATIENT_CLINIC_OR_DEPARTMENT_OTHER): Payer: 59

## 2022-12-28 ENCOUNTER — Telehealth (HOSPITAL_BASED_OUTPATIENT_CLINIC_OR_DEPARTMENT_OTHER): Payer: Self-pay

## 2022-12-28 ENCOUNTER — Other Ambulatory Visit: Payer: Self-pay

## 2022-12-28 ENCOUNTER — Encounter (HOSPITAL_BASED_OUTPATIENT_CLINIC_OR_DEPARTMENT_OTHER): Payer: Self-pay | Admitting: Emergency Medicine

## 2022-12-28 DIAGNOSIS — K5900 Constipation, unspecified: Secondary | ICD-10-CM | POA: Insufficient documentation

## 2022-12-28 DIAGNOSIS — E119 Type 2 diabetes mellitus without complications: Secondary | ICD-10-CM | POA: Diagnosis not present

## 2022-12-28 DIAGNOSIS — Z7984 Long term (current) use of oral hypoglycemic drugs: Secondary | ICD-10-CM | POA: Insufficient documentation

## 2022-12-28 DIAGNOSIS — R1010 Upper abdominal pain, unspecified: Secondary | ICD-10-CM | POA: Diagnosis present

## 2022-12-28 DIAGNOSIS — I10 Essential (primary) hypertension: Secondary | ICD-10-CM | POA: Diagnosis not present

## 2022-12-28 LAB — COMPREHENSIVE METABOLIC PANEL
ALT: 17 U/L (ref 0–44)
AST: 20 U/L (ref 15–41)
Albumin: 3.4 g/dL — ABNORMAL LOW (ref 3.5–5.0)
Alkaline Phosphatase: 74 U/L (ref 38–126)
Anion gap: 7 (ref 5–15)
BUN: 14 mg/dL (ref 8–23)
CO2: 24 mmol/L (ref 22–32)
Calcium: 8.7 mg/dL — ABNORMAL LOW (ref 8.9–10.3)
Chloride: 105 mmol/L (ref 98–111)
Creatinine, Ser: 0.59 mg/dL (ref 0.44–1.00)
GFR, Estimated: >60 mL/min (ref >60)
Glucose, Bld: 77 mg/dL (ref 70–99)
Potassium: 3.5 mmol/L (ref 3.5–5.1)
Sodium: 136 mmol/L (ref 135–145)
Total Bilirubin: 0.4 mg/dL (ref 0.3–1.2)
Total Protein: 6.6 g/dL (ref 6.5–8.1)

## 2022-12-28 LAB — CBC WITH DIFFERENTIAL/PLATELET
Abs Immature Granulocytes: 0.03 10*3/uL (ref 0.00–0.07)
Basophils Absolute: 0 10*3/uL (ref 0.0–0.1)
Basophils Relative: 0 %
Eosinophils Absolute: 0.1 10*3/uL (ref 0.0–0.5)
Eosinophils Relative: 1 %
HCT: 41.4 % (ref 36.0–46.0)
Hemoglobin: 13.5 g/dL (ref 12.0–15.0)
Immature Granulocytes: 0 %
Lymphocytes Relative: 39 %
Lymphs Abs: 3 10*3/uL (ref 0.7–4.0)
MCH: 31.2 pg (ref 26.0–34.0)
MCHC: 32.6 g/dL (ref 30.0–36.0)
MCV: 95.6 fL (ref 80.0–100.0)
Monocytes Absolute: 0.6 10*3/uL (ref 0.1–1.0)
Monocytes Relative: 8 %
Neutro Abs: 3.9 10*3/uL (ref 1.7–7.7)
Neutrophils Relative %: 52 %
Platelets: 201 10*3/uL (ref 150–400)
RBC: 4.33 MIL/uL (ref 3.87–5.11)
RDW: 13.2 % (ref 11.5–15.5)
WBC: 7.6 10*3/uL (ref 4.0–10.5)
nRBC: 0 % (ref 0.0–0.2)

## 2022-12-28 MED ORDER — KETOROLAC TROMETHAMINE 30 MG/ML IJ SOLN
30.0000 mg | Freq: Once | INTRAMUSCULAR | Status: AC
  Administered 2022-12-28: 30 mg via INTRAVENOUS
  Filled 2022-12-28: qty 1

## 2022-12-28 MED ORDER — ALUM & MAG HYDROXIDE-SIMETH 200-200-20 MG/5ML PO SUSP
30.0000 mL | Freq: Once | ORAL | Status: AC
  Administered 2022-12-28: 30 mL via ORAL
  Filled 2022-12-28: qty 30

## 2022-12-28 MED ORDER — SUCRALFATE 1 GM/10ML PO SUSP: 1.0000 g | Freq: Three times a day (TID) | ORAL | 0 refills | Status: DC

## 2022-12-28 MED ORDER — DICYCLOMINE HCL 10 MG PO CAPS
10.0000 mg | ORAL_CAPSULE | Freq: Once | ORAL | Status: AC
  Administered 2022-12-28: 10 mg via ORAL
  Filled 2022-12-28: qty 1

## 2022-12-28 MED ORDER — DICYCLOMINE HCL 10 MG/ML IM SOLN: 20.0000 mg | Freq: Once | INTRAMUSCULAR | Status: DC

## 2022-12-28 NOTE — Telephone Encounter (Signed)
Patient requested to speak with this nurse, states Dr. Nicanor Alcon prescribed liquid carafate. States pharmacy needed new order to change prescription to tablets as requested. Dr. Charm Barges states it is ok to call in verbal order for carafate tablets with same dosage. [facility] called and prescription updated.

## 2022-12-28 NOTE — ED Triage Notes (Signed)
Reports abd pain x 1 month. She states "whatever they've given me is not working". She did follow up with GI and was given "sample pills for acid" that she has been taking for 1 week. Reports non-bloody diarrhea, denies n/v or fever. Reports 2 episodes of loose stool in the last 24 hours. Pt rates pain 9/10, and characterizes it as "unbearable cramping". Pt ambulatory without apparent difficulty, freely answering triage questions and appears to be in NAD.

## 2022-12-28 NOTE — Discharge Instructions (Addendum)
Start miralax one capful twice daily

## 2022-12-28 NOTE — ED Provider Notes (Signed)
[facility] Provider Note   CSN: [id number] Arrival date & time: 12/28/22  0153     History  Chief Complaint  Patient presents with   Abdominal Pain    Erin Craig is a 62 y.o. female.  The history is provided by the patient.  Abdominal Pain Pain location: upper abdomen. Pain quality: cramping   Pain quality comment:  With gas Pain radiates to:  Does not radiate Pain severity:  Moderate Onset quality:  Gradual Duration:  4 weeks Timing:  Constant Progression:  Unchanged Context: not alcohol use and not sick contacts   Relieved by:  Nothing Worsened by:  Nothing Ineffective treatments:  None tried Associated symptoms: flatus   Associated symptoms: no anorexia, no chest pain, no chills, no dysuria, no fever, no nausea, no shortness of breath and no vomiting   Risk factors: no NSAID use   Patient being seen by her PMD and GI for abdominal pain and has been started on medication without relief per her report.      Past Medical History:  Diagnosis Date   Bradycardia    Diabetes mellitus without complication (HCC)    Heart murmur    Hypercholesterolemia    Hypertension      Home Medications Prior to Admission medications   Medication Sig Start Date End Date Taking? Authorizing Provider  sucralfate (CARAFATE) 1 GM/10ML suspension Take 10 mLs (1 g total) by mouth 4 (four) times daily -  with meals and at bedtime. 12/28/22  Yes Shaynna Husby, MD  cyanocobalamin (,VITAMIN B-12,) 1000 MCG/ML injection Inject 1,000 mcg into the muscle every 30 (thirty) days. 04/07/16   [provider]  cyclobenzaprine (FLEXERIL) 5 MG tablet Take 2 tablets (10 mg total) by mouth 2 (two) times daily as needed for muscle spasms. 06/23/21   Horton, Mayer Masker, MD  diclofenac Sodium (VOLTAREN) 1 % GEL Apply 4 g topically 4 (four) times daily. 04/07/22   Jeannie Fend, PA-C  dicyclomine (BENTYL) 20 MG tablet Take 1 tablet (20 mg total) by  mouth every 8 (eight) hours as needed for spasms. 08/22/21   Terrilee Files, MD  ibuprofen (ADVIL) 600 MG tablet Take 1 tablet (600 mg total) by mouth every 6 (six) hours as needed. 06/23/21   Horton, Mayer Masker, MD  meloxicam (MOBIC) 15 MG tablet TAKE 1 TABLET (15 MG TOTAL) BY MOUTH DAILY. 03/04/22   Candelaria Stagers, DPM  metFORMIN (GLUCOPHAGE) 500 MG tablet Take by mouth. 09/29/16   [provider]  omeprazole (PRILOSEC) 40 MG capsule Take 40 mg by mouth daily. 11/09/21   [provider]  ondansetron (ZOFRAN) 4 MG tablet Take 1 tablet (4 mg total) by mouth every 8 (eight) hours as needed for nausea or vomiting. 12/01/21   Tegeler, Canary Brim, MD  ondansetron (ZOFRAN-ODT) 4 MG disintegrating tablet 4mg  ODT q4 hours prn nausea/vomit 10/14/22   Melene Plan, DO  oxyCODONE-acetaminophen (PERCOCET) 5-325 MG tablet Take 1 tablet by mouth every 4 (four) hours as needed for moderate pain. 06/21/21   Dione Booze, MD  potassium chloride SA (KLOR-CON) 20 MEQ tablet Take 1 tablet (20 mEq total) by mouth daily. 10/22/20   Molpus, John, MD  atorvastatin (LIPITOR) 10 MG tablet Take 10 mg by mouth daily.  07/14/19  [provider]  furosemide (LASIX) 20 MG tablet Take 1 tablet (20 mg total) by mouth daily. 01/09/18 07/14/19  Bethel Born, PA-C  glipiZIDE (GLUCOTROL) 5 MG tablet Take  10 mg by mouth daily before breakfast.  07/14/19  [provider]  lisinopril (PRINIVIL,ZESTRIL) 5 MG tablet Take 5 mg by mouth daily.  07/14/19  [provider]      Allergies    Patient has no known allergies.    Review of Systems   Review of Systems  Constitutional:  Negative for chills and fever.  HENT:  Negative for facial swelling.   Eyes:  Negative for redness.  Respiratory:  Negative for shortness of breath.   Cardiovascular:  Negative for chest pain.  Gastrointestinal:  Positive for abdominal pain and flatus. Negative for anorexia, nausea and vomiting.  Genitourinary:  Negative  for dysuria.  All other systems reviewed and are negative.   Physical Exam Updated Vital Signs BP 133/75   Pulse (!) 56   Temp 98 F (36.7 C) (Oral)   Resp 16   Ht 5\' 4"  (1.626 m)   Wt 77.6 kg   LMP  (LMP Unknown)   SpO2 100%   BMI 29.35 kg/m  Physical Exam Vitals and nursing note reviewed.  Constitutional:      General: She is not in acute distress.    Appearance: She is well-developed.  HENT:     Head: Normocephalic and atraumatic.     Nose: Nose normal.  Eyes:     Pupils: Pupils are equal, round, and reactive to light.  Cardiovascular:     Rate and Rhythm: Normal rate and regular rhythm.     Pulses: Normal pulses.     Heart sounds: Normal heart sounds.  Pulmonary:     Effort: Pulmonary effort is normal. No respiratory distress.     Breath sounds: Normal breath sounds.  Abdominal:     General: Bowel sounds are normal. There is no distension.     Palpations: Abdomen is soft. There is no mass.     Tenderness: There is no abdominal tenderness. There is no guarding or rebound.     Hernia: No hernia is present.  Genitourinary:    Vagina: No vaginal discharge.  Musculoskeletal:        General: Normal range of motion.     Cervical back: Normal range of motion and neck supple.  Skin:    General: Skin is warm and dry.     Capillary Refill: Capillary refill takes less than 2 seconds.     Findings: No erythema or rash.  Neurological:     General: No focal deficit present.     Mental Status: She is oriented to person, place, and time.     Deep Tendon Reflexes: Reflexes normal.  Psychiatric:        Mood and Affect: Mood normal.     ED Results / Procedures / Treatments   Labs (all labs ordered are listed, but only abnormal results are displayed) Results for orders placed or performed during the hospital encounter of 12/28/22  CBC with Differential  Result Value Ref Range   WBC 7.6 4.0 - 10.5 K/uL   RBC 4.33 3.87 - 5.11 MIL/uL   Hemoglobin 13.5 12.0 - 15.0 g/dL    HCT 16.1 09.6 - 04.5 %   MCV 95.6 80.0 - 100.0 fL   MCH 31.2 26.0 - 34.0 pg   MCHC 32.6 30.0 - 36.0 g/dL   RDW 40.9 81.1 - 91.4 %   Platelets 201 150 - 400 K/uL   nRBC 0.0 0.0 - 0.2 %   Neutrophils Relative % 52 %   Neutro Abs 3.9 1.7 -  7.7 K/uL   Lymphocytes Relative 39 %   Lymphs Abs 3.0 0.7 - 4.0 K/uL   Monocytes Relative 8 %   Monocytes Absolute 0.6 0.1 - 1.0 K/uL   Eosinophils Relative 1 %   Eosinophils Absolute 0.1 0.0 - 0.5 K/uL   Basophils Relative 0 %   Basophils Absolute 0.0 0.0 - 0.1 K/uL   Immature Granulocytes 0 %   Abs Immature Granulocytes 0.03 0.00 - 0.07 K/uL  Comprehensive metabolic panel  Result Value Ref Range   Sodium 136 135 - 145 mmol/L   Potassium 3.5 3.5 - 5.1 mmol/L   Chloride 105 98 - 111 mmol/L   CO2 24 22 - 32 mmol/L   Glucose, Bld 77 70 - 99 mg/dL   BUN 14 8 - 23 mg/dL   Creatinine, Ser 4.09 0.44 - 1.00 mg/dL   Calcium 8.7 (L) 8.9 - 10.3 mg/dL   Total Protein 6.6 6.5 - 8.1 g/dL   Albumin 3.4 (L) 3.5 - 5.0 g/dL   AST 20 15 - 41 U/L   ALT 17 0 - 44 U/L   Alkaline Phosphatase 74 38 - 126 U/L   Total Bilirubin 0.4 0.3 - 1.2 mg/dL   GFR, Estimated >81 >19 mL/min   Anion gap 7 5 - 15   CT Renal Stone Study  Result Date: 12/28/2022 CLINICAL DATA:  Diarrhea, flank pain EXAM: CT ABDOMEN AND PELVIS WITHOUT CONTRAST TECHNIQUE: Multidetector CT imaging of the abdomen and pelvis was performed following the standard protocol without IV contrast. RADIATION DOSE REDUCTION: This exam was performed according to the departmental dose-optimization program which includes automated exposure control, adjustment of the mA and/or kV according to patient size and/or use of iterative reconstruction technique. COMPARISON:  08/22/2021 FINDINGS: Lower chest: No acute abnormality.  Small hiatal hernia. Hepatobiliary: No focal liver abnormality is seen. Status post cholecystectomy. No biliary dilatation. Pancreas: Unremarkable Spleen: Unremarkable Adrenals/Urinary Tract: Adrenal  glands are unremarkable. Kidneys are normal, without renal calculi, focal lesion, or hydronephrosis. Bladder is unremarkable. Stomach/Bowel: Moderate colonic stool burden without evidence of obstruction. The stomach, small bowel, and large bowel are otherwise unremarkable. Appendix normal. No free intraperitoneal gas or fluid. Vascular/Lymphatic: Aortic atherosclerosis. No enlarged abdominal or pelvic lymph nodes. Reproductive: Status post hysterectomy. No adnexal masses. Other: No abdominal wall hernia. Musculoskeletal: No acute bone abnormality. No lytic or blastic bone lesion. Degenerative changes are seen within the lumbar spine. IMPRESSION: 1. No acute intra-abdominal pathology identified. No definite radiographic explanation for the patient's reported symptoms. 2. Moderate colonic stool burden without evidence of obstruction. 3. Aortic atherosclerosis. Aortic Atherosclerosis (ICD10-I70.0). Electronically Signed   By: Helyn Numbers M.D.   On: 12/28/2022 03:55     Radiology CT Renal Stone Study  Result Date: 12/28/2022 CLINICAL DATA:  Diarrhea, flank pain EXAM: CT ABDOMEN AND PELVIS WITHOUT CONTRAST TECHNIQUE: Multidetector CT imaging of the abdomen and pelvis was performed following the standard protocol without IV contrast. RADIATION DOSE REDUCTION: This exam was performed according to the departmental dose-optimization program which includes automated exposure control, adjustment of the mA and/or kV according to patient size and/or use of iterative reconstruction technique. COMPARISON:  08/22/2021 FINDINGS: Lower chest: No acute abnormality.  Small hiatal hernia. Hepatobiliary: No focal liver abnormality is seen. Status post cholecystectomy. No biliary dilatation. Pancreas: Unremarkable Spleen: Unremarkable Adrenals/Urinary Tract: Adrenal glands are unremarkable. Kidneys are normal, without renal calculi, focal lesion, or hydronephrosis. Bladder is unremarkable. Stomach/Bowel: Moderate colonic stool  burden without evidence of obstruction. The stomach, small bowel,  and large bowel are otherwise unremarkable. Appendix normal. No free intraperitoneal gas or fluid. Vascular/Lymphatic: Aortic atherosclerosis. No enlarged abdominal or pelvic lymph nodes. Reproductive: Status post hysterectomy. No adnexal masses. Other: No abdominal wall hernia. Musculoskeletal: No acute bone abnormality. No lytic or blastic bone lesion. Degenerative changes are seen within the lumbar spine. IMPRESSION: 1. No acute intra-abdominal pathology identified. No definite radiographic explanation for the patient's reported symptoms. 2. Moderate colonic stool burden without evidence of obstruction. 3. Aortic atherosclerosis. Aortic Atherosclerosis (ICD10-I70.0). Electronically Signed   By: Helyn Numbers M.D.   On: 12/28/2022 03:55    Procedures Procedures    Medications Ordered in ED Medications  alum & mag hydroxide-simeth (MAALOX/MYLANTA) 200-200-20 MG/5ML suspension 30 mL (30 mLs Oral Given 12/28/22 0417)  dicyclomine (BENTYL) capsule 10 mg (10 mg Oral Given 12/28/22 0413)  ketorolac (TORADOL) 30 MG/ML injection 30 mg (30 mg Intravenous Given 12/28/22 0414)    ED Course/ Medical Decision Making/ A&P                             Medical Decision Making Patient with pain not responsive to medication she was given she does not know the names of them   Amount and/or Complexity of Data Reviewed External Data Reviewed: notes.    Details: Per notes of GI and PMD patient is not taking medications that have been prescribed for gerd and pain  Labs: ordered.    Details: Normal LFTs. Normal white count 7.6, normal hemoglobin 13.6, normal platelet count. Normal sodium 136, normal potassium 3.5 normal creatinine .60  Radiology: ordered and independent interpretation performed.    Details: Constipation on CT  Risk OTC drugs. Prescription drug management. Risk Details: Patient is having large volume gas and medications from PMD  and GI are reportedly not working.  Patient reports compliance.  Continue these medications I will add carafate.  I have given gerd friendly diet instructions.  Moreover patient is very constipated on CT.  Start miralax twice daily x 1 week and then one capful daily.  Stable for discharge with close follow up.  Strict return      Final Clinical Impression(s) / ED Diagnoses Return for intractable cough, coughing up blood, fevers > 100.4 unrelieved by medication, shortness of breath, intractable vomiting, chest pain, shortness of breath, weakness, numbness, changes in speech, facial asymmetry, abdominal pain, passing out, Inability to tolerate liquids or food, cough, altered mental status or any concerns. No signs of systemic illness or infection. The patient is nontoxic-appearing on exam and vital signs are within normal limits.  I have reviewed the triage vital signs and the nursing notes. Pertinent labs & imaging results that were available during my care of the patient were reviewed by me and considered in my medical decision making (see chart for details). After history, exam, and medical workup I feel the patient has been appropriately medically screened and is safe for discharge home. Pertinent diagnoses were discussed with the patient. Patient was given return precautions.   Rx / DC Orders ED Discharge Orders          Ordered    sucralfate (CARAFATE) 1 GM/10ML suspension  3 times daily with meals & bedtime        12/28/22 0459              Jose Corvin, MD 12/28/22 9518

## 2022-12-30 ENCOUNTER — Ambulatory Visit: Payer: 59 | Admitting: Podiatry

## 2022-12-31 ENCOUNTER — Encounter: Payer: Self-pay | Admitting: Podiatry

## 2022-12-31 ENCOUNTER — Ambulatory Visit (INDEPENDENT_AMBULATORY_CARE_PROVIDER_SITE_OTHER): Payer: 59 | Admitting: Podiatry

## 2022-12-31 DIAGNOSIS — L84 Corns and callosities: Secondary | ICD-10-CM | POA: Diagnosis not present

## 2022-12-31 DIAGNOSIS — E1165 Type 2 diabetes mellitus with hyperglycemia: Secondary | ICD-10-CM | POA: Diagnosis not present

## 2022-12-31 NOTE — Progress Notes (Signed)
  Subjective:  Patient ID: Erin Craig, female    DOB: 08/20/1960,   MRN: [id number]  Chief Complaint  Patient presents with   Callouses     Left foot corn    62 y.o. female presents for for follow-up of concern of callus on her right foot that has been acting up and starting to cause pain and wanted it trimmed again. Here to pick up orthotics as well.  She is diabetic and Last A1c was 6.0.  Denies any other pedal complaints. Denies n/v/f/c.   Past Medical History:  Diagnosis Date   Bradycardia    Diabetes mellitus without complication (HCC)    Heart murmur    Hypercholesterolemia    Hypertension     Objective:  Physical Exam: Vascular: DP/PT pulses 2/4 bilateral. CFT <3 seconds. Normal hair growth on digits. No edema.  Skin. No lacerations or abrasions bilateral feet. Hyperkeratotic lesion noted to the lateral fifth metatarsal on the left and dorsum of the left second digit.  Musculoskeletal: MMT 5/5 bilateral lower extremities in DF, PF, Inversion and Eversion. Deceased ROM in DF of ankle joint. No pain over lateral ankle joint line. And no pain over ATFL. Negative anterior drawer signs. No pain along peroneal tendons. No pain medial around angle. No pain in achilles. No pain with DF, PF and no pain with STJ ROM.  Neurological: Sensation intact to light touch.   Assessment:   1. Callus of foot   2. Type 2 diabetes mellitus with hyperglycemia, without long-term current use of insulin (HCC)        Plan:  Patient was evaluated and treated and all questions answered. -Discussed corns and calluses with patient and treatment options.  -Hyperkeratotic tissue was debrided with chisel without incident to left second digit.  -Encouraged daily moisturizing -Picked up orthotics and instructed on use.  -Discussed use of pumice stone -Advised good supportive shoes and inserts -Patient to return to office as needed or sooner if condition worsens.    Louann Sjogren, DPM

## 2023-02-17 ENCOUNTER — Encounter: Payer: Self-pay | Admitting: Podiatry

## 2023-02-17 ENCOUNTER — Ambulatory Visit (INDEPENDENT_AMBULATORY_CARE_PROVIDER_SITE_OTHER): Payer: 59 | Admitting: Podiatry

## 2023-02-17 DIAGNOSIS — L84 Corns and callosities: Secondary | ICD-10-CM

## 2023-02-17 DIAGNOSIS — E1165 Type 2 diabetes mellitus with hyperglycemia: Secondary | ICD-10-CM | POA: Diagnosis not present

## 2023-02-17 NOTE — Progress Notes (Signed)
  Subjective:  Patient ID: Erin Craig, female    DOB: [birth year],   MRN: [id number]  Chief Complaint  Patient presents with   Callouses    Trim corn 2nd toe left     62 y.o. female presents for for follow-up of concern of callus on her right foot that has been acting up and starting to cause pain and wanted it trimmed again. She is diabetic and Last A1c was 6.0.  Denies any other pedal complaints. Denies n/v/f/c.   Past Medical History:  Diagnosis Date   Bradycardia    Diabetes mellitus without complication (HCC)    Heart murmur    Hypercholesterolemia    Hypertension     Objective:  Physical Exam: Vascular: DP/PT pulses 2/4 bilateral. CFT <3 seconds. Normal hair growth on digits. No edema.  Skin. No lacerations or abrasions bilateral feet. Hyperkeratotic lesion noted to the lateral fifth metatarsal on the left and dorsum of the left second digit.  Musculoskeletal: MMT 5/5 bilateral lower extremities in DF, PF, Inversion and Eversion. Deceased ROM in DF of ankle joint. No pain over lateral ankle joint line. And no pain over ATFL. Negative anterior drawer signs. No pain along peroneal tendons. No pain medial around angle. No pain in achilles. No pain with DF, PF and no pain with STJ ROM.  Neurological: Sensation intact to light touch.   Assessment:   1. Callus of foot   2. Type 2 diabetes mellitus with hyperglycemia, without long-term current use of insulin (HCC)        Plan:  Patient was evaluated and treated and all questions answered. -Discussed corns and calluses with patient and treatment options.  -Hyperkeratotic tissue was debrided with chisel without incident to left second digit.  -Encouraged daily moisturizing -Discussed use of pumice stone -Advised good supportive shoes and inserts -Patient to return to office as needed or sooner if condition worsens.    Louann Sjogren, DPM

## 2023-05-10 ENCOUNTER — Encounter: Payer: Self-pay | Admitting: Podiatry

## 2023-05-10 ENCOUNTER — Ambulatory Visit (INDEPENDENT_AMBULATORY_CARE_PROVIDER_SITE_OTHER): Payer: 59 | Admitting: Podiatry

## 2023-05-10 DIAGNOSIS — E1165 Type 2 diabetes mellitus with hyperglycemia: Secondary | ICD-10-CM

## 2023-05-10 DIAGNOSIS — B351 Tinea unguium: Secondary | ICD-10-CM | POA: Diagnosis not present

## 2023-05-10 DIAGNOSIS — E119 Type 2 diabetes mellitus without complications: Secondary | ICD-10-CM | POA: Diagnosis not present

## 2023-05-10 DIAGNOSIS — L84 Corns and callosities: Secondary | ICD-10-CM

## 2023-05-10 DIAGNOSIS — M79674 Pain in right toe(s): Secondary | ICD-10-CM

## 2023-05-10 DIAGNOSIS — M79675 Pain in left toe(s): Secondary | ICD-10-CM

## 2023-05-10 NOTE — Progress Notes (Signed)
Subjective:  Patient ID: Erin Craig, female    DOB: [birth year],   MRN: [id number]  Chief Complaint  Patient presents with   Nail Problem    RFC.    62 y.o. female presents for concern of thickened elongated and painful nails that are difficult to trim. Also relates painful callus Requesting to have them trimmed today. Relates burning and tingling in their feet. Patient is diabetic and last A1c was 6.0.  PCP:  Tracey Harries, MD    Denies any other pedal complaints. Denies n/v/f/c.   Past Medical History:  Diagnosis Date   Bradycardia    Diabetes mellitus without complication (HCC)    Heart murmur    Hypercholesterolemia    Hypertension     Objective:  Physical Exam: Vascular: DP/PT pulses 2/4 bilateral. CFT <3 seconds. Normal hair growth on digits. No edema.  Skin. No lacerations or abrasions bilateral feet. Hyperkeratotic lesion noted to the lateral fifth metatarsal on the left and dorsum of the left second digit.  Musculoskeletal: MMT 5/5 bilateral lower extremities in DF, PF, Inversion and Eversion. Deceased ROM in DF of ankle joint. No pain over lateral ankle joint line. And no pain over ATFL. Negative anterior drawer signs. No pain along peroneal tendons. No pain medial around angle. No pain in achilles. No pain with DF, PF and no pain with STJ ROM.  Neurological: Sensation intact to light touch.   Assessment:   1. Encounter for diabetic foot exam (HCC)   2. Type 2 diabetes mellitus with hyperglycemia, without long-term current use of insulin (HCC)   3. Callus of foot        Plan:  Patient was evaluated and treated and all questions answered. -Discussed and educated patient on diabetic foot care, especially with  regards to the vascular, neurological and musculoskeletal systems.  -Stressed the importance of good glycemic control and the detriment of not  controlling glucose levels in relation to the foot. -Hyperkeratotic tissue was debrided with chisel without  incident to left second digit.  -Discussed supportive shoes at all times and checking feet regularly.  -Mechanically debrided all nails 1-5 bilateral using sterile nail nipper and filed with dremel without incident  -Answered all patient questions -Patient to return  in 3 months for at risk foot care -Patient advised to call the office if any problems or questions arise in the meantime.    Louann Sjogren, DPM

## 2023-05-26 ENCOUNTER — Ambulatory Visit: Payer: 59 | Admitting: Podiatry

## 2023-05-31 ENCOUNTER — Encounter (HOSPITAL_BASED_OUTPATIENT_CLINIC_OR_DEPARTMENT_OTHER): Payer: Self-pay | Admitting: Emergency Medicine

## 2023-05-31 ENCOUNTER — Emergency Department (HOSPITAL_BASED_OUTPATIENT_CLINIC_OR_DEPARTMENT_OTHER): Payer: 59

## 2023-05-31 ENCOUNTER — Emergency Department (HOSPITAL_BASED_OUTPATIENT_CLINIC_OR_DEPARTMENT_OTHER)
Admission: EM | Admit: 2023-05-31 | Discharge: 2023-05-31 | Disposition: A | Discharge status: Home / Self Care | Payer: 59 | Attending: Emergency Medicine | Admitting: Emergency Medicine

## 2023-05-31 ENCOUNTER — Other Ambulatory Visit: Payer: Self-pay

## 2023-05-31 DIAGNOSIS — Z79899 Other long term (current) drug therapy: Secondary | ICD-10-CM | POA: Diagnosis not present

## 2023-05-31 DIAGNOSIS — Z7984 Long term (current) use of oral hypoglycemic drugs: Secondary | ICD-10-CM | POA: Insufficient documentation

## 2023-05-31 DIAGNOSIS — E119 Type 2 diabetes mellitus without complications: Secondary | ICD-10-CM | POA: Diagnosis not present

## 2023-05-31 DIAGNOSIS — R079 Chest pain, unspecified: Secondary | ICD-10-CM | POA: Diagnosis present

## 2023-05-31 DIAGNOSIS — I10 Essential (primary) hypertension: Secondary | ICD-10-CM | POA: Insufficient documentation

## 2023-05-31 DIAGNOSIS — M940 Chondrocostal junction syndrome [Tietze]: Secondary | ICD-10-CM | POA: Diagnosis not present

## 2023-05-31 LAB — BASIC METABOLIC PANEL
Anion gap: 14 (ref 5–15)
BUN: 14 mg/dL (ref 8–23)
CO2: 21 mmol/L — ABNORMAL LOW (ref 22–32)
Calcium: 9 mg/dL (ref 8.9–10.3)
Chloride: 103 mmol/L (ref 98–111)
Creatinine, Ser: 0.61 mg/dL (ref 0.44–1.00)
GFR, Estimated: >60 mL/min (ref >60)
Glucose, Bld: 107 mg/dL — ABNORMAL HIGH (ref 70–99)
Potassium: 3.5 mmol/L (ref 3.5–5.1)
Sodium: 138 mmol/L (ref 135–145)

## 2023-05-31 LAB — CBC
HCT: 40.5 % (ref 36.0–46.0)
Hemoglobin: 13.2 g/dL (ref 12.0–15.0)
MCH: 30.6 pg (ref 26.0–34.0)
MCHC: 32.6 g/dL (ref 30.0–36.0)
MCV: 94 fL (ref 80.0–100.0)
Platelets: 225 10*3/uL (ref 150–400)
RBC: 4.31 MIL/uL (ref 3.87–5.11)
RDW: 13.2 % (ref 11.5–15.5)
WBC: 5.6 10*3/uL (ref 4.0–10.5)
nRBC: 0 % (ref 0.0–0.2)

## 2023-05-31 LAB — TROPONIN I (HIGH SENSITIVITY)
Troponin I (High Sensitivity): 2 ng/L
Troponin I (High Sensitivity): 3 ng/L

## 2023-05-31 LAB — BRAIN NATRIURETIC PEPTIDE: B Natriuretic Peptide: 41.1 pg/mL (ref 0.0–100.0)

## 2023-05-31 LAB — D-DIMER, QUANTITATIVE: D-Dimer, Quant: 0.44 ug{FEU}/mL (ref 0.00–0.50)

## 2023-05-31 MED ORDER — ACETAMINOPHEN 500 MG PO TABS
1000.0000 mg | ORAL_TABLET | Freq: Once | ORAL | Status: AC
  Administered 2023-05-31: 1000 mg via ORAL
  Filled 2023-05-31: qty 2

## 2023-05-31 MED ORDER — KETOROLAC TROMETHAMINE 15 MG/ML IJ SOLN
15.0000 mg | Freq: Once | INTRAMUSCULAR | Status: AC
  Administered 2023-05-31: 15 mg via INTRAVENOUS
  Filled 2023-05-31: qty 1

## 2023-05-31 NOTE — ED Provider Notes (Signed)
[facility] Provider Note   CSN: [id number] Arrival date & time: 05/31/23  1409     History  Chief Complaint  Patient presents with   Chest Pain    Erin Craig is a 62 y.o. female with PMH as listed below who presents with left sided CP that began at 0900 today while she was at rest, describes pain as pressure and radiates to left arm, denies any SOB, weakness, dizziness, n/v, diaphoresis. She does endorse a a subacute/chronic dry cough as well as orthopnea when she lies down at night, has to sleep elevated. States the left side of her anterior chest is very tender to palpation. Has no h/o DVT/PE, no leg swelling/redness, h/o malignancy or hormone intake, no recent travel/hospitalizations/surgeries. No h/o MI. No h/o similar pain.   Past Medical History:  Diagnosis Date   Bradycardia    Diabetes mellitus without complication (HCC)    Heart murmur    Hypercholesterolemia    Hypertension        Home Medications Prior to Admission medications   Medication Sig Start Date End Date Taking? Authorizing Provider  cyanocobalamin (,VITAMIN B-12,) 1000 MCG/ML injection Inject 1,000 mcg into the muscle every 30 (thirty) days. 04/07/16   [provider]  cyclobenzaprine (FLEXERIL) 5 MG tablet Take 2 tablets (10 mg total) by mouth 2 (two) times daily as needed for muscle spasms. 06/23/21   Horton, Mayer Masker, MD  diclofenac Sodium (VOLTAREN) 1 % GEL Apply 4 g topically 4 (four) times daily. 04/07/22   Jeannie Fend, PA-C  dicyclomine (BENTYL) 20 MG tablet Take 1 tablet (20 mg total) by mouth every 8 (eight) hours as needed for spasms. 08/22/21   Terrilee Files, MD  hydrOXYzine (ATARAX) 10 MG tablet Take 10 mg by mouth 3 (three) times daily as needed. 10/21/22   [provider]  ibuprofen (ADVIL) 600 MG tablet Take 1 tablet (600 mg total) by mouth every 6 (six) hours as needed. 06/23/21   Horton, Mayer Masker, MD  meloxicam  (MOBIC) 15 MG tablet TAKE 1 TABLET (15 MG TOTAL) BY MOUTH DAILY. 03/04/22   Candelaria Stagers, DPM  metFORMIN (GLUCOPHAGE) 500 MG tablet Take by mouth. 09/29/16   [provider]  omeprazole (PRILOSEC) 40 MG capsule Take 40 mg by mouth daily. 11/09/21   [provider]  ondansetron (ZOFRAN) 4 MG tablet Take 1 tablet (4 mg total) by mouth every 8 (eight) hours as needed for nausea or vomiting. 12/01/21   Tegeler, Canary Brim, MD  ondansetron (ZOFRAN-ODT) 4 MG disintegrating tablet 4mg  ODT q4 hours prn nausea/vomit 10/14/22   Melene Plan, DO  oxyCODONE-acetaminophen (PERCOCET) 5-325 MG tablet Take 1 tablet by mouth every 4 (four) hours as needed for moderate pain. 06/21/21   Dione Booze, MD  pioglitazone (ACTOS) 15 MG tablet Take 15 mg by mouth daily.    [provider]  potassium chloride SA (KLOR-CON) 20 MEQ tablet Take 1 tablet (20 mEq total) by mouth daily. 10/22/20   Molpus, John, MD  sucralfate (CARAFATE) 1 GM/10ML suspension Take 10 mLs (1 g total) by mouth 4 (four) times daily -  with meals and at bedtime. 12/28/22   Palumbo, April, MD  atorvastatin (LIPITOR) 10 MG tablet Take 10 mg by mouth daily.  07/14/19  [provider]  furosemide (LASIX) 20 MG tablet Take 1 tablet (20 mg total) by mouth daily. 01/09/18 07/14/19  Bethel Born, PA-C  glipiZIDE (GLUCOTROL) 5 MG  tablet Take 10 mg by mouth daily before breakfast.  07/14/19  [provider]  lisinopril (PRINIVIL,ZESTRIL) 5 MG tablet Take 5 mg by mouth daily.  07/14/19  [provider]      Allergies    Patient has no known allergies.    Review of Systems   Review of Systems A 10 point review of systems was performed and is negative unless otherwise reported in HPI.  Physical Exam Updated Vital Signs BP 108/60   Pulse (!) 44   Temp 98.2 F (36.8 C)   Resp 15   Ht 5\' 4"  (1.626 m)   Wt 72.6 kg   LMP  (LMP Unknown)   SpO2 98%   BMI 27.46 kg/m  Physical Exam General: Normal appearing  female, lying in bed.  HEENT: Sclera anicteric, MMM, trachea midline.  Cardiology: RRR, no murmurs/rubs/gallops. BL radial and DP pulses equal bilaterally. Significant TTP to anterior chest wall at ribs 3-4 parasternally. No skin changes or rashes, no signs of trauma.  Resp: Normal respiratory rate and effort. CTAB, no wheezes, rhonchi, crackles.  Abd: Soft, non-tender, non-distended. No rebound tenderness or guarding.  GU: Deferred. MSK: No peripheral edema or signs of trauma. Extremities without deformity or TTP. No cyanosis or clubbing. Skin: warm, dry.  Neuro: A&Ox4, CNs II-XII grossly intact. MAEs. Sensation grossly intact.  Psych: Normal mood and affect.   ED Results / Procedures / Treatments   Labs (all labs ordered are listed, but only abnormal results are displayed) Labs Reviewed  BASIC METABOLIC PANEL - Abnormal; Notable for the following components:      Result Value   CO2 21 (*)    Glucose, Bld 107 (*)    All other components within normal limits  CBC  D-DIMER, QUANTITATIVE  BRAIN NATRIURETIC PEPTIDE  TROPONIN I (HIGH SENSITIVITY)  TROPONIN I (HIGH SENSITIVITY)    EKG EKG Interpretation Date/Time:  Monday May 31 2023 14:19:21 EDT Ventricular Rate:  56 PR Interval:  131 QRS Duration:  91 QT Interval:  435 QTC Calculation: 420 R Axis:   70  Text Interpretation: Sinus rhythm Borderline T abnormalities, diffuse leads Confirmed by Vivi Barrack [phone]) on 05/31/2023 3:30:36 PM  Radiology DG Chest 2 View  Result Date: 05/31/2023 CLINICAL DATA:  Chest pain. EXAM: CHEST - 2 VIEW COMPARISON:  01/02/2022 FINDINGS: The cardiomediastinal contours are normal. The lungs are clear. Pulmonary vasculature is normal. No consolidation, pleural effusion, or pneumothorax. No acute osseous abnormalities are seen. IMPRESSION: No active cardiopulmonary disease. Electronically Signed   By: Narda Rutherford M.D.   On: 05/31/2023 18:21    Procedures Procedures    Medications  Ordered in ED Medications  ketorolac (TORADOL) 15 MG/ML injection 15 mg (15 mg Intravenous Given 05/31/23 1617)  acetaminophen (TYLENOL) tablet 1,000 mg (1,000 mg Oral Given 05/31/23 1613)    ED Course/ Medical Decision Making/ A&P                          Medical Decision Making Amount and/or Complexity of Data Reviewed Labs: ordered. Decision-making details documented in ED Course. Radiology: ordered.  Risk OTC drugs. Prescription drug management.    This patient presents to the ED for concern of anterior L chest pain, this involves an extensive number of treatment options, and is a complaint that carries with it a high risk of complications and morbidity.  I considered the following differential and admission for this acute, potentially life threatening condition.   MDM:  DDX for chest pain includes but is not limited to:  Very low suspicion for ACS vs aortic dissection given presenting sx; EKG doesn't show signs of ischemia or pericarditis, and trop negative x2. Patient cannot PERC out based on age, but D dimer is negative, minimal risk factors for PE. No abdominal pain and no c/f biliary disease. Considered pulm edema/pleural effusion/CHF with report of some SOB when she lies flat, but BNP is neg and CXR doesn't show any fluid. Also no PNA or PTX. Consider GERD as well. But given TTP of chest wall, must more likely to be costochondritis or chest wall pathology such as pulled pectoralis muscle, though patient has not noted any increased exercise/trauma. Most likely costochondritis. Will DC w/ tylenol/ibuprofen and o/p f/u with PCP. HEART score is low. DC w/ discharge instructions/return precautions. All questions answered to patient's satisfaction.    Clinical Course as of 05/31/23 1827  Mon May 31, 2023  1530 CBC, BMP, initial troponin unremarkable [HN]  1825 B Natriuretic Peptide: 41.1 neg [HN]  1825 D-Dimer, Quant: 0.44 neg [HN]  1825 Troponin I (High Sensitivity): 3 neg  [HN]    Clinical Course User Index [HN] Loetta Rough, MD    Labs: I Ordered, and personally interpreted labs.  The pertinent results include:  those listed above  Imaging Studies ordered: I ordered imaging studies including CXR I independently visualized and interpreted imaging. I agree with the radiologist interpretation  Additional history obtained from chart review.    Cardiac Monitoring: The patient was maintained on a cardiac monitor.  I personally viewed and interpreted the cardiac monitored which showed an underlying rhythm of: NSR  Reevaluation: After the interventions noted above, I reevaluated the patient and found that they have :improved  Social Determinants of Health: Lives independently  Disposition:  HEART score low. DC w/ discharge instructions/return precautions. All questions answered to patient's satisfaction.    Co morbidities that complicate the patient evaluation  Past Medical History:  Diagnosis Date   Bradycardia    Diabetes mellitus without complication (HCC)    Heart murmur    Hypercholesterolemia    Hypertension      Medicines Meds ordered this encounter  Medications   ketorolac (TORADOL) 15 MG/ML injection 15 mg   acetaminophen (TYLENOL) tablet 1,000 mg    I have reviewed the patients home medicines and have made adjustments as needed  Problem List / ED Course: Problem List Items Addressed This Visit   None Visit Diagnoses     Acute costochondritis    -  Primary                   This note was created using dictation software, which may contain spelling or grammatical errors.    Loetta Rough, MD 05/31/23 [phone]

## 2023-05-31 NOTE — ED Triage Notes (Signed)
Pt reports left sided CP that began at 0900 today, describes pain as pressure and radiates to left arm, denies any ShoB, weakness, dizziness, n/v, diaphoresis or any other sxs

## 2023-05-31 NOTE — Discharge Instructions (Addendum)
Thank you for coming to [facility] Emergency Department. You were seen for left sided chest pain. We did an exam, labs, and imaging, and these showed likely costochondritis, or an acute inflammation of the cartilage where the rib meets the sternum. You can alternate taking Tylenol and ibuprofen as needed for pain. You can take 650mg  tylenol (acetaminophen) every 4-6 hours, and 600 mg ibuprofen 3 times a day. Ice applied to the area could also improve the inflammation. Please follow up with your primary care provider within 1-2 weeks if your symptoms do not improve.  Do not hesitate to return to the ED or call 911 if you experience: -Worsening symptoms -Worsening shortness of breath or chest pain -Lightheadedness, passing out -Fevers/chills -Anything else that concerns you

## 2023-08-06 ENCOUNTER — Ambulatory Visit (INDEPENDENT_AMBULATORY_CARE_PROVIDER_SITE_OTHER): Payer: 59 | Admitting: Podiatry

## 2023-08-06 ENCOUNTER — Encounter: Payer: Self-pay | Admitting: Podiatry

## 2023-08-06 DIAGNOSIS — L84 Corns and callosities: Secondary | ICD-10-CM

## 2023-08-06 DIAGNOSIS — E1165 Type 2 diabetes mellitus with hyperglycemia: Secondary | ICD-10-CM

## 2023-08-06 DIAGNOSIS — M2042 Other hammer toe(s) (acquired), left foot: Secondary | ICD-10-CM

## 2023-08-06 NOTE — Progress Notes (Signed)
  Subjective:  Patient ID: Erin Craig, female    DOB: Sep 04, 1960,   MRN: [id number]  No chief complaint on file.   62 y.o. female presents for concern of thickened elongated and painful nails that are difficult to trim. Also relates painful callus Requesting to have them trimmed today. Relates burning and tingling in their feet. Patient is diabetic and last A1c was 6.0.  PCP:  Tracey Harries, MD    Denies any other pedal complaints. Denies n/v/f/c.   Past Medical History:  Diagnosis Date   Bradycardia    Diabetes mellitus without complication (HCC)    Heart murmur    Hypercholesterolemia    Hypertension     Objective:  Physical Exam: Vascular: DP/PT pulses 2/4 bilateral. CFT <3 seconds. Normal hair growth on digits. No edema.  Skin. No lacerations or abrasions bilateral feet. Hyperkeratotic lesion noted to the lateral fifth metatarsal on the left and dorsum of the left second digit.  Musculoskeletal: MMT 5/5 bilateral lower extremities in DF, PF, Inversion and Eversion. Deceased ROM in DF of ankle joint. No pain over lateral ankle joint line. And no pain over ATFL. Negative anterior drawer signs. No pain along peroneal tendons. No pain medial around angle. No pain in achilles. No pain with DF, PF and no pain with STJ ROM.  Neurological: Sensation intact to light touch.   Assessment:   1. Callus of foot   2. Type 2 diabetes mellitus with hyperglycemia, without long-term current use of insulin (HCC)   3. Hammertoe of left foot        Plan:  Patient was evaluated and treated and all questions answered. -Discussed and educated patient on diabetic foot care, especially with  regards to the vascular, neurological and musculoskeletal systems.  -Stressed the importance of good glycemic control and the detriment of not  controlling glucose levels in relation to the foot. -Hyperkeratotic tissue was debrided with chisel without incident to left second digit.  -Educated on hammertoes  and treatment options  -Discussed padding including toe caps and crest pads.  -Discussed need for potential surgery if pain does not improved. Discussed surgery and perioperative course however patient does not have the ability to take time off of work right now.  -Patient to return  in 3 months for at risk foot care -Patient advised to call the office if any problems or questions arise in the meantime.    Louann Sjogren, DPM

## 2023-08-09 ENCOUNTER — Ambulatory Visit: Payer: 59 | Admitting: Podiatry

## 2023-09-29 ENCOUNTER — Emergency Department (HOSPITAL_COMMUNITY): Payer: 59

## 2023-09-29 ENCOUNTER — Encounter (HOSPITAL_COMMUNITY): Payer: Self-pay

## 2023-09-29 ENCOUNTER — Emergency Department (HOSPITAL_COMMUNITY)
Admission: EM | Admit: 2023-09-29 | Discharge: 2023-09-29 | Disposition: A | Discharge status: Home / Self Care | Payer: 59 | Attending: Emergency Medicine | Admitting: Emergency Medicine

## 2023-09-29 ENCOUNTER — Other Ambulatory Visit: Payer: Self-pay

## 2023-09-29 DIAGNOSIS — Z7984 Long term (current) use of oral hypoglycemic drugs: Secondary | ICD-10-CM | POA: Diagnosis not present

## 2023-09-29 DIAGNOSIS — R0789 Other chest pain: Secondary | ICD-10-CM | POA: Insufficient documentation

## 2023-09-29 DIAGNOSIS — Z7982 Long term (current) use of aspirin: Secondary | ICD-10-CM | POA: Insufficient documentation

## 2023-09-29 DIAGNOSIS — E119 Type 2 diabetes mellitus without complications: Secondary | ICD-10-CM | POA: Diagnosis not present

## 2023-09-29 DIAGNOSIS — I10 Essential (primary) hypertension: Secondary | ICD-10-CM | POA: Insufficient documentation

## 2023-09-29 DIAGNOSIS — R079 Chest pain, unspecified: Secondary | ICD-10-CM

## 2023-09-29 LAB — RESP PANEL BY RT-PCR (RSV, FLU A&B, COVID)  RVPGX2
Influenza A by PCR: NEGATIVE
Influenza B by PCR: NEGATIVE
Resp Syncytial Virus by PCR: NEGATIVE
SARS Coronavirus 2 by RT PCR: NEGATIVE

## 2023-09-29 LAB — CBC
HCT: 43 % (ref 36.0–46.0)
Hemoglobin: 13.9 g/dL (ref 12.0–15.0)
MCH: 30.7 pg (ref 26.0–34.0)
MCHC: 32.3 g/dL (ref 30.0–36.0)
MCV: 94.9 fL (ref 80.0–100.0)
Platelets: 209 10*3/uL (ref 150–400)
RBC: 4.53 MIL/uL (ref 3.87–5.11)
RDW: 13 % (ref 11.5–15.5)
WBC: 5.9 10*3/uL (ref 4.0–10.5)
nRBC: 0 % (ref 0.0–0.2)

## 2023-09-29 LAB — COMPREHENSIVE METABOLIC PANEL
ALT: 17 U/L (ref 0–44)
AST: 19 U/L (ref 15–41)
Albumin: 3.7 g/dL (ref 3.5–5.0)
Alkaline Phosphatase: 68 U/L (ref 38–126)
Anion gap: 8 (ref 5–15)
BUN: 13 mg/dL (ref 8–23)
CO2: 24 mmol/L (ref 22–32)
Calcium: 9 mg/dL (ref 8.9–10.3)
Chloride: 105 mmol/L (ref 98–111)
Creatinine, Ser: 0.58 mg/dL (ref 0.44–1.00)
GFR, Estimated: >60 mL/min (ref >60)
Glucose, Bld: 91 mg/dL (ref 70–99)
Potassium: 3.5 mmol/L (ref 3.5–5.1)
Sodium: 137 mmol/L (ref 135–145)
Total Bilirubin: 0.6 mg/dL (ref 0.0–1.2)
Total Protein: 6.6 g/dL (ref 6.5–8.1)

## 2023-09-29 LAB — TROPONIN I (HIGH SENSITIVITY)
Troponin I (High Sensitivity): 2 ng/L (ref <18)
Troponin I (High Sensitivity): 2 ng/L (ref <18)

## 2023-09-29 LAB — LIPASE, BLOOD: Lipase: 38 U/L (ref 11–51)

## 2023-09-29 LAB — BRAIN NATRIURETIC PEPTIDE: B Natriuretic Peptide: 64 pg/mL (ref 0.0–100.0)

## 2023-09-29 MED ORDER — ACETAMINOPHEN 325 MG PO TABS: 650.0000 mg | ORAL_TABLET | Freq: Four times a day (QID) | ORAL | 0 refills | Status: AC | PRN

## 2023-09-29 MED ORDER — LIDOCAINE 5 % EX PTCH
1.0000 | MEDICATED_PATCH | Freq: Once | CUTANEOUS | Status: DC
  Administered 2023-09-29: 1 via TRANSDERMAL
  Filled 2023-09-29: qty 1

## 2023-09-29 MED ORDER — CYCLOBENZAPRINE HCL 10 MG PO TABS: 10.0000 mg | ORAL_TABLET | Freq: Two times a day (BID) | ORAL | 0 refills | Status: AC | PRN

## 2023-09-29 MED ORDER — KETOROLAC TROMETHAMINE 15 MG/ML IJ SOLN
15.0000 mg | Freq: Once | INTRAMUSCULAR | Status: AC
  Administered 2023-09-29: 15 mg via INTRAVENOUS
  Filled 2023-09-29: qty 1

## 2023-09-29 MED ORDER — SODIUM CHLORIDE 0.9 % IV BOLUS
1000.0000 mL | Freq: Once | INTRAVENOUS | Status: AC
  Administered 2023-09-29: 1000 mL via INTRAVENOUS

## 2023-09-29 MED ORDER — LIDOCAINE 5 % EX PTCH: 1.0000 | MEDICATED_PATCH | Freq: Every day | CUTANEOUS | 0 refills | Status: AC | PRN

## 2023-09-29 MED ORDER — IBUPROFEN 600 MG PO TABS: 600.0000 mg | ORAL_TABLET | Freq: Four times a day (QID) | ORAL | 0 refills | Status: DC | PRN

## 2023-09-29 MED ORDER — LIDOCAINE VISCOUS HCL 2 % MT SOLN
15.0000 mL | Freq: Once | OROMUCOSAL | Status: AC
  Administered 2023-09-29: 15 mL via ORAL
  Filled 2023-09-29: qty 15

## 2023-09-29 MED ORDER — ALUM & MAG HYDROXIDE-SIMETH 200-200-20 MG/5ML PO SUSP
30.0000 mL | Freq: Once | ORAL | Status: AC
  Administered 2023-09-29: 30 mL via ORAL
  Filled 2023-09-29: qty 30

## 2023-09-29 NOTE — ED Triage Notes (Signed)
Patient brought in by [facility] from home for having chest pain when travelling home from work this morning. Patient denies nausea and vomiting. She took aspirin after calling EMS.

## 2023-09-29 NOTE — Discharge Instructions (Addendum)
Return to the Emergency Department if you have unusual chest pain, pressure, or discomfort, shortness of breath, nausea, vomiting, burping, heartburn, tingling upper body parts, sweating, cold, clammy skin, or racing heartbeat. Call 911 if you think you are having a heart attack. Follow cardiac diet - avoid fatty & fried foods, don't eat too much red meat, eat lots of fruits & vegetables, and dairy products should be low fat. Please lose weight if you are overweight. Become more active with walking, gardening, or any other activity that gets you to moving.   Please return to the emergency department immediately for any new or concerning symptoms, or if you get worse. 

## 2023-09-29 NOTE — ED Provider Notes (Signed)
[place] EMERGENCY DEPARTMENT AT [facility] Provider Note  CSN: [id number] Arrival date & time: 09/29/23 0701  Chief Complaint(s) Chest Pain  HPI Erin Craig is a 63 y.o. female with past medical history as below, significant for bradycardia, HLD, HTN, T2DM who presents to the ED with complaint of chest pain  Onset pain around 430 this morning while she was driving home from work, she works at the [employer], carrying trays of mail.  Pain described as a tightness, pressure sensation left-sided chest wall, worsened with arm movement.  Worsened with direct pressure.  No associated dyspnea, diaphoresis, nausea or vomiting.  Does report recent rhinorrhea and postnasal drip but no fevers or cough.  Symptoms have not improved since the onset.  Have been constant.  Pain does not radiate.  Past Medical History Past Medical History:  Diagnosis Date   Bradycardia    Diabetes mellitus without complication (HCC)    Heart murmur    Hypercholesterolemia    Hypertension    Patient Active Problem List   Diagnosis Date Noted   Impingement of left ankle joint 02/16/2022   Type 2 diabetes mellitus with hyperglycemia (HCC)    Left arm numbness 10/06/2021   Palpitations 10/06/2021   Esophageal stricture 05/06/2021   Gastroesophageal reflux disease without esophagitis 05/04/2021   Hiatal hernia 05/04/2021   Acute cystitis without hematuria 11/18/2016   Need for influenza vaccination 06/05/2016   B12 deficiency 04/22/2016   Dyshidrotic eczema 03/11/2016   Type 2 diabetes mellitus without complication, without long-term current use of insulin (HCC) 03/11/2016   Colitis 11/10/2012   Family history of colon cancer 11/10/2012   Screening for colon cancer 11/10/2012   Home Medication(s) Prior to Admission medications   Medication Sig Start Date End Date Taking? Authorizing Provider  acetaminophen (TYLENOL) 325 MG tablet Take 2 tablets (650 mg total) by mouth every 6 (six) hours as  needed. 09/29/23  Yes Tanda Rockers A, DO  ASPIRIN LOW DOSE 81 MG tablet Take 81 mg by mouth daily. 1-4 tablets   Yes [provider]  cyanocobalamin (,VITAMIN B-12,) 1000 MCG/ML injection Inject 1,000 mcg into the muscle every 30 (thirty) days. 04/07/16  Yes [provider]  cyclobenzaprine (FLEXERIL) 10 MG tablet Take 1 tablet (10 mg total) by mouth 2 (two) times daily as needed for muscle spasms. 09/29/23  Yes Tanda Rockers A, DO  diclofenac Sodium (VOLTAREN) 1 % GEL Apply 4 g topically 4 (four) times daily. 04/07/22  Yes Jeannie Fend, PA-C  fluticasone (CUTIVATE) 0.005 % ointment SMARTSIG:sparingly Topical Twice Daily 08/05/23  Yes [provider]  ibuprofen (ADVIL) 600 MG tablet Take 1 tablet (600 mg total) by mouth every 6 (six) hours as needed. 09/29/23  Yes Tanda Rockers A, DO  lidocaine (LIDODERM) 5 % Place 1 patch onto the skin daily as needed. Remove & Discard patch within 12 hours or as directed by MD 09/29/23  Yes Sloan Leiter, DO  MOUNJARO 2.5 MG/0.5ML Pen SMARTSIG:2.5 Milligram(s) SUB-Q Once a Week   Yes [provider]  omeprazole (PRILOSEC) 40 MG capsule Take 40 mg by mouth daily. 11/09/21  Yes [provider]  ondansetron (ZOFRAN) 4 MG tablet Take 1 tablet (4 mg total) by mouth every 8 (eight) hours as needed for nausea or vomiting. 12/01/21  Yes Tegeler, Canary Brim, MD  atorvastatin (LIPITOR) 10 MG tablet Take 10 mg by mouth daily.  07/14/19  [provider]  furosemide (LASIX) 20 MG tablet Take 1 tablet (  20 mg total) by mouth daily. 01/09/18 07/14/19  Bethel Born, PA-C  glipiZIDE (GLUCOTROL) 5 MG tablet Take 10 mg by mouth daily before breakfast.  07/14/19  [provider]  lisinopril (PRINIVIL,ZESTRIL) 5 MG tablet Take 5 mg by mouth daily.  07/14/19  [provider]                                                                                                                                    Past Surgical  History Past Surgical History:  Procedure Laterality Date   ABDOMINAL HYSTERECTOMY     CESAREAN SECTION     CHOLECYSTECTOMY     FOOT SURGERY     Family History Family History  Problem Relation Age of Onset   Alzheimer's disease Other    Coronary artery disease Other    Breast cancer Other    Arthritis Other    Hypertension Other    Diabetes Other     Social History Social History   Tobacco Use   Smoking status: Never   Smokeless tobacco: Never  Vaping Use   Vaping status: Never Used  Substance Use Topics   Alcohol use: No   Drug use: No   Allergies Patient has no known allergies.  Review of Systems A thorough review of systems was obtained and all systems are negative except as noted in the HPI and PMH.   Physical Exam Vital Signs  I have reviewed the triage vital signs BP 125/68   Pulse (!) 46   Temp 98.3 F (36.8 C) (Oral)   Resp 14   Ht 5\' 4"  (1.626 m)   Wt 67.1 kg   LMP  (LMP Unknown)   SpO2 97%   BMI 25.40 kg/m  Physical Exam Vitals and nursing note reviewed.  Constitutional:      General: She is not in acute distress.    Appearance: Normal appearance.  HENT:     Head: Normocephalic and atraumatic.     Right Ear: External ear normal.     Left Ear: External ear normal.     Nose: Nose normal.     Mouth/Throat:     Mouth: Mucous membranes are moist.  Eyes:     General: No scleral icterus.       Right eye: No discharge.        Left eye: No discharge.  Cardiovascular:     Rate and Rhythm: Regular rhythm. Bradycardia present.     Pulses: Normal pulses.     Heart sounds: S1 normal and S2 normal.     No S3 or S4 sounds.  Pulmonary:     Effort: Pulmonary effort is normal. No respiratory distress.     Breath sounds: Normal breath sounds. No stridor.  Chest:    Abdominal:     General: Abdomen is flat. There is no distension.     Palpations: Abdomen is soft.  Tenderness: There is no abdominal tenderness.  Musculoskeletal:     Cervical  back: No rigidity.     Right lower leg: No edema.     Left lower leg: No edema.  Skin:    General: Skin is warm and dry.     Capillary Refill: Capillary refill takes less than 2 seconds.  Neurological:     Mental Status: She is alert.  Psychiatric:        Mood and Affect: Mood normal.        Behavior: Behavior normal. Behavior is cooperative.     ED Results and Treatments Labs (all labs ordered are listed, but only abnormal results are displayed) Labs Reviewed  RESP PANEL BY RT-PCR (RSV, FLU A&B, COVID)  RVPGX2  CBC  COMPREHENSIVE METABOLIC PANEL  LIPASE, BLOOD  BRAIN NATRIURETIC PEPTIDE  TROPONIN I (HIGH SENSITIVITY)  TROPONIN I (HIGH SENSITIVITY)                                                                                                                          Radiology DG Chest Port 1 View Result Date: 09/29/2023 CLINICAL DATA:  Chest pain. EXAM: PORTABLE CHEST 1 VIEW COMPARISON:  PA Lat chest 05/31/2023 FINDINGS: There is a low inspiration. This limits evaluation of the lower lung fields. The visualized lungs are clear. There is mild cardiomegaly without CHF pattern. Stable mediastinum with aortic atherosclerosis and tortuosity. No pleural effusion is seen. No new osseous findings. Thoracic spondylosis. Overlying monitor wires. IMPRESSION: 1. Low inspiration. No evidence of acute chest disease, but limited view of the bases. 2. Mild cardiomegaly without CHF pattern. 3. Aortic atherosclerosis. Electronically Signed   By: Almira Bar M.D.   On: 09/29/2023 08:08    Pertinent labs & imaging results that were available during my care of the patient were reviewed by me and considered in my medical decision making (see MDM for details).  Medications Ordered in ED Medications  lidocaine (LIDODERM) 5 % 1 patch (1 patch Transdermal Patch Applied 09/29/23 0853)  ketorolac (TORADOL) 15 MG/ML injection 15 mg (15 mg Intravenous Given 09/29/23 0853)  alum & mag hydroxide-simeth  (MAALOX/MYLANTA) 200-200-20 MG/5ML suspension 30 mL (30 mLs Oral Given 09/29/23 0853)    And  lidocaine (XYLOCAINE) 2 % viscous mouth solution 15 mL (15 mLs Oral Given 09/29/23 0853)  sodium chloride 0.9 % bolus 1,000 mL (1,000 mLs Intravenous New Bag/Given 09/29/23 0910)  Procedures Procedures  (including critical care time)  Medical Decision Making / ED Course    Medical Decision Making:    Donnette Macmullen is a 63 y.o. female  with past medical history as below, significant for bradycardia, HLD, HTN, T2DM who presents to the ED with complaint of chest pain. The complaint involves an extensive differential diagnosis and also carries with it a high risk of complications and morbidity.  Serious etiology was considered. Ddx includes but is not limited to: Differential includes all life-threatening causes for chest pain. This includes but is not exclusive to acute coronary syndrome, aortic dissection, pulmonary embolism, cardiac tamponade, community-acquired pneumonia, pericarditis, musculoskeletal chest wall pain, etc.   Complete initial physical exam performed, notably the patient was in no acute distress, sitting comfortably on stretcher, no hypoxia.    Reviewed and confirmed nursing documentation for past medical history, family history, social history.  Vital signs reviewed.   Clinical Course as of 09/29/23 1106  Wed Sep 29, 2023  1022 Pain improved [SG]    Clinical Course User Index [SG] Sloan Leiter, DO    Brief summary: 63 year old female history as above here with chest pain.  Given aspirin prior to arrival.  Currently still having some chest pain.  It is reproducible on my exam.  Will collect screening labs, maintain cardiac monitoring.  Give analgesia.  Reassess.   Labs reviewed, these are stable.  X-ray stable, EKG nonischemic.  Symptoms have  resolved on recheck.  Favor atypical etiology      The patient's chest pain is not suggestive of pulmonary embolus, cardiac ischemia, aortic dissection, pericarditis, myocarditis, pulmonary embolism, pneumothorax, pneumonia, Zoster, or esophageal perforation, or other serious etiology.  Historically not abrupt in onset, tearing or ripping, pulses symmetric. EKG nonspecific for ischemia/infarction. No dysrhythmias, brugada, WPW, prolonged QT noted.   Troponin negative x2. CXR reviewed. Labs without demonstration of acute pathology unless otherwise noted above. Low HEART Score: 0-3 points (0.9-1.7% risk of MACE).  Given the extremely low risk of these diagnoses further testing and evaluation for these possibilities does not appear to be indicated at this time. Patient in no distress and overall condition improved here in the ED. Detailed discussions were had with the patient regarding current findings, and need for close f/u with PCP or on call doctor. The patient has been instructed to return immediately if the symptoms worsen in any way for re-evaluation. Patient verbalized understanding and is in agreement with current care plan. All questions answered prior to discharge.        Additional history obtained: -Additional history obtained from family -External records from outside source obtained and reviewed including: Chart review including previous notes, labs, imaging, consultation notes including  Prior ED visits, home medications, primary care documentation   Lab Tests: -I ordered, reviewed, and interpreted labs.   The pertinent results include:   Labs Reviewed  RESP PANEL BY RT-PCR (RSV, FLU A&B, COVID)  RVPGX2  CBC  COMPREHENSIVE METABOLIC PANEL  LIPASE, BLOOD  BRAIN NATRIURETIC PEPTIDE  TROPONIN I (HIGH SENSITIVITY)  TROPONIN I (HIGH SENSITIVITY)    Notable for trop neg x2  EKG   EKG Interpretation Date/Time:  Wednesday September 29 2023 07:18:46 EST Ventricular Rate:   50 PR Interval:  147 QRS Duration:  94 QT Interval:  444 QTC Calculation: 405 R Axis:   67  Text Interpretation: Sinus rhythm Borderline T abnormalities, anterior leads Confirmed by Tanda Rockers ([id number]) on 09/29/2023 9:48:35 AM  Imaging Studies ordered: I ordered imaging studies including cxr I independently visualized the following imaging with scope of interpretation limited to determining acute life threatening conditions related to emergency care; findings noted above I independently visualized and interpreted imaging. I agree with the radiologist interpretation   Medicines ordered and prescription drug management: Meds ordered this encounter  Medications   lidocaine (LIDODERM) 5 % 1 patch   ketorolac (TORADOL) 15 MG/ML injection 15 mg   AND Linked Order Group    alum & mag hydroxide-simeth (MAALOX/MYLANTA) 200-200-20 MG/5ML suspension 30 mL    lidocaine (XYLOCAINE) 2 % viscous mouth solution 15 mL   sodium chloride 0.9 % bolus 1,000 mL   cyclobenzaprine (FLEXERIL) 10 MG tablet    Sig: Take 1 tablet (10 mg total) by mouth 2 (two) times daily as needed for muscle spasms.    Dispense:  20 tablet    Refill:  0   lidocaine (LIDODERM) 5 %    Sig: Place 1 patch onto the skin daily as needed. Remove & Discard patch within 12 hours or as directed by MD    Dispense:  15 patch    Refill:  0   acetaminophen (TYLENOL) 325 MG tablet    Sig: Take 2 tablets (650 mg total) by mouth every 6 (six) hours as needed.    Dispense:  36 tablet    Refill:  0   ibuprofen (ADVIL) 600 MG tablet    Sig: Take 1 tablet (600 mg total) by mouth every 6 (six) hours as needed.    Dispense:  30 tablet    Refill:  0    -I have reviewed the patients home medicines and have made adjustments as needed   Consultations Obtained: na   Cardiac Monitoring: The patient was maintained on a cardiac monitor.  I personally viewed and interpreted the cardiac monitored which showed an underlying rhythm  of: sinus brady Continuous pulse oximetry interpreted by myself, 98% on RA.    Social Determinants of Health:  Diagnosis or treatment significantly limited by social determinants of health: na   Reevaluation: After the interventions noted above, I reevaluated the patient and found that they have resolved  Co morbidities that complicate the patient evaluation  Past Medical History:  Diagnosis Date   Bradycardia    Diabetes mellitus without complication (HCC)    Heart murmur    Hypercholesterolemia    Hypertension       Dispostion: Disposition decision including need for hospitalization was considered, and patient discharged from emergency department.    Final Clinical Impression(s) / ED Diagnoses Final diagnoses:  Chest pain with low risk of acute coronary syndrome        Sloan Leiter, DO 09/29/23 1106

## 2023-09-29 NOTE — ED Notes (Signed)
 Pt ambulated to the restroom.

## 2023-10-09 ENCOUNTER — Emergency Department (HOSPITAL_BASED_OUTPATIENT_CLINIC_OR_DEPARTMENT_OTHER): Admitting: Radiology

## 2023-10-09 ENCOUNTER — Emergency Department (HOSPITAL_BASED_OUTPATIENT_CLINIC_OR_DEPARTMENT_OTHER)
Admission: EM | Admit: 2023-10-09 | Discharge: 2023-10-09 | Disposition: A | Discharge status: Home / Self Care | Attending: Emergency Medicine | Admitting: Emergency Medicine

## 2023-10-09 ENCOUNTER — Encounter (HOSPITAL_BASED_OUTPATIENT_CLINIC_OR_DEPARTMENT_OTHER): Payer: Self-pay | Admitting: Emergency Medicine

## 2023-10-09 DIAGNOSIS — Z7982 Long term (current) use of aspirin: Secondary | ICD-10-CM | POA: Insufficient documentation

## 2023-10-09 DIAGNOSIS — M25561 Pain in right knee: Secondary | ICD-10-CM | POA: Insufficient documentation

## 2023-10-09 DIAGNOSIS — E119 Type 2 diabetes mellitus without complications: Secondary | ICD-10-CM | POA: Diagnosis not present

## 2023-10-09 DIAGNOSIS — Z7984 Long term (current) use of oral hypoglycemic drugs: Secondary | ICD-10-CM | POA: Insufficient documentation

## 2023-10-09 DIAGNOSIS — Z79899 Other long term (current) drug therapy: Secondary | ICD-10-CM | POA: Insufficient documentation

## 2023-10-09 DIAGNOSIS — I10 Essential (primary) hypertension: Secondary | ICD-10-CM | POA: Insufficient documentation

## 2023-10-09 MED ORDER — KETOROLAC TROMETHAMINE 15 MG/ML IJ SOLN
15.0000 mg | Freq: Once | INTRAMUSCULAR | Status: AC
  Administered 2023-10-09: 15 mg via INTRAMUSCULAR
  Filled 2023-10-09: qty 1

## 2023-10-09 NOTE — ED Triage Notes (Signed)
 Pt c/o RT knee pain and swelling since last night. Denies injury. Referred by [facility]

## 2023-10-09 NOTE — ED Provider Notes (Signed)
  EMERGENCY DEPARTMENT AT [facility] Provider Note   CSN: [id number] Arrival date & time: 10/09/23  1338     History  Chief Complaint  Patient presents with   Knee Pain    Erin Craig is a 63 y.o. female with PMHx DM, HLD, HTN, OA who presents to ED concerned for right knee pain and swelling since last night despite no recent knee injury. Patient ambulatory but stating that it hurts to put weight onto this right knee. Patient has not taken any pain medication for the pain today. Patient requesting work note x2 days. No hx of gout.  Denies fever, chest pain, dyspnea, cough, nausea, vomiting, diarrhea, dysuria, hematuria, hematochezia. .   Knee Pain      Home Medications Prior to Admission medications   Medication Sig Start Date End Date Taking? Authorizing Provider  acetaminophen (TYLENOL) 325 MG tablet Take 2 tablets (650 mg total) by mouth every 6 (six) hours as needed. 09/29/23   Tanda Rockers A, DO  ASPIRIN LOW DOSE 81 MG tablet Take 81 mg by mouth daily. 1-4 tablets    [provider]  cyanocobalamin (,VITAMIN B-12,) 1000 MCG/ML injection Inject 1,000 mcg into the muscle every 30 (thirty) days. 04/07/16   [provider]  cyclobenzaprine (FLEXERIL) 10 MG tablet Take 1 tablet (10 mg total) by mouth 2 (two) times daily as needed for muscle spasms. 09/29/23   Sloan Leiter, DO  diclofenac Sodium (VOLTAREN) 1 % GEL Apply 4 g topically 4 (four) times daily. 04/07/22   Jeannie Fend, PA-C  fluticasone (CUTIVATE) 0.005 % ointment SMARTSIG:sparingly Topical Twice Daily 08/05/23   [provider]  ibuprofen (ADVIL) 600 MG tablet Take 1 tablet (600 mg total) by mouth every 6 (six) hours as needed. 09/29/23   Tanda Rockers A, DO  lidocaine (LIDODERM) 5 % Place 1 patch onto the skin daily as needed. Remove & Discard patch within 12 hours or as directed by MD 09/29/23   Sloan Leiter, DO  MOUNJARO 2.5 MG/0.5ML Pen SMARTSIG:2.5 Milligram(s) SUB-Q  Once a Week    [provider]  omeprazole (PRILOSEC) 40 MG capsule Take 40 mg by mouth daily. 11/09/21   [provider]  ondansetron (ZOFRAN) 4 MG tablet Take 1 tablet (4 mg total) by mouth every 8 (eight) hours as needed for nausea or vomiting. 12/01/21   Tegeler, Canary Brim, MD  atorvastatin (LIPITOR) 10 MG tablet Take 10 mg by mouth daily.  07/14/19  [provider]  furosemide (LASIX) 20 MG tablet Take 1 tablet (20 mg total) by mouth daily. 01/09/18 07/14/19  Bethel Born, PA-C  glipiZIDE (GLUCOTROL) 5 MG tablet Take 10 mg by mouth daily before breakfast.  07/14/19  [provider]  lisinopril (PRINIVIL,ZESTRIL) 5 MG tablet Take 5 mg by mouth daily.  07/14/19  [provider]      Allergies    Patient has no known allergies.    Review of Systems   Review of Systems  Musculoskeletal:        Knee pain    Physical Exam Updated Vital Signs BP 112/60 (BP Location: Left Arm)   Pulse 60   Temp 97.6 F (36.4 C)   Resp 15   LMP  (LMP Unknown)   SpO2 98%  Physical Exam Vitals and nursing note reviewed.  Constitutional:      General: She is not in acute distress.    Appearance: She is not ill-appearing or toxic-appearing.  HENT:  Head: Normocephalic and atraumatic.     Mouth/Throat:     Mouth: Mucous membranes are moist.  Eyes:     General: No scleral icterus.       Right eye: No discharge.        Left eye: No discharge.     Conjunctiva/sclera: Conjunctivae normal.  Cardiovascular:     Rate and Rhythm: Normal rate and regular rhythm.     Pulses: Normal pulses.     Heart sounds: Normal heart sounds. No murmur heard. Pulmonary:     Effort: Pulmonary effort is normal. No respiratory distress.     Breath sounds: Normal breath sounds. No wheezing, rhonchi or rales.  Abdominal:     General: Abdomen is flat. Bowel sounds are normal.  Musculoskeletal:     Right lower leg: No edema.     Left lower leg: No edema.     Comments: Mild  swelling of the right knee when compared to left knee with possible small joint effusion palpated. No erythema or increased warmth. +2 pedal pulses. Sensation to light touch intact. Area non-tense. ROM intact. Tenderness to palpation of anteromedial knee.  Skin:    General: Skin is warm and dry.     Findings: No rash.  Neurological:     General: No focal deficit present.     Mental Status: She is alert. Mental status is at baseline.  Psychiatric:        Mood and Affect: Mood normal.     ED Results / Procedures / Treatments   Labs (all labs ordered are listed, but only abnormal results are displayed) Labs Reviewed - No data to display  EKG None  Radiology DG Knee 2 Views Right Result Date: 10/09/2023 CLINICAL DATA:  Knee pain. EXAM: RIGHT KNEE - 1-2 VIEW COMPARISON:  None Available. FINDINGS: No fracture. Normal alignment. Medial tibiofemoral joint space narrowing. Moderate tricompartmental peripheral spurring. Subchondral cystic change in the central patella. There is a joint effusion. Ossified intra-articular body measuring 10 mm in the suprapatellar joint space. No erosive change. No focal soft tissue abnormalities. IMPRESSION: 1. 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 2. Joint effusion with ossified intra-articular body. Electronically Signed   By: Narda Rutherford M.D.   On: 10/09/2023 15:13    Procedures Procedures    Medications Ordered in ED Medications  ketorolac (TORADOL) 15 MG/ML injection 15 mg (15 mg Intramuscular Given 10/09/23 1424)    ED Course/ Medical Decision Making/ A&P                                 Medical Decision Making Amount and/or Complexity of Data Reviewed Radiology: ordered.  Risk Prescription drug management.   This patient presents to the ED for concern of right knee pain, this involves an extensive number of treatment options, and is a complaint that carries with it a high risk of complications and  morbidity.  The differential diagnosis includes hemarthrosis, gout, septic joint, fracture, tendonitis, carpal tunnel syndrome, muscle strain, bursitis, compartment syndrome   Co morbidities that complicate the patient evaluation  DM, HLD, HTN   Additional history obtained:  Dr. Everlene Other PCP 09/2023 CMP with reassuring BUN/Cr   Problem List / ED Course / Critical interventions / Medication management  Patient presents to ED concern for right knee pain since last night.  No trauma. Patient has not taken any pain medicine for pain.  Patient ambulatory on this knee  but states that it is painful.  Denies any recent infectious symptoms.  Patient afebrile with stable vitals.  Physical exam with mild swelling of the right knee but is otherwise unremarkable. I ordered imaging studies including right knee x-ray. I independently visualized and interpreted imaging. I agree with the radiologist interpretation of OA with joint effusion and ossified intra-articular body.  Patient stated she feels a lot better after knee sleeve was applied and Toradol was given.  Educated patient on alternating ibuprofen and Tylenol for pain control. Shared results with patient. Answered all questions.  Patient stating that she already has an orthopedic provider that she is able to have close follow-up with. Patient agrees to follow up with orthopedics next week. Provided patient with knee brace.  I have reviewed the patients home medicines and have made adjustments as needed Patient afebrile with stable vitals.  Provided  with return precautions.  Discharged in good condition.   Ddx these are considered less likely due to history of present illness and physical exam -hemarthrosis: no injuries; ROM intact -gout: no warmth or erythema; ROM intact  -septic joint: afebrile; no warmth or erythema; no skin changes; ROM intact  -compartment syndrome: area not tense; neurovascularly intact   Social Determinants of  Health:  none         Final Clinical Impression(s) / ED Diagnoses Final diagnoses:  Acute pain of right knee    Rx / DC Orders ED Discharge Orders     None         Dorthy Cooler, [place] 10/09/23 1549    Virgina Norfolk, DO 10/10/23 0703

## 2023-10-09 NOTE — Discharge Instructions (Signed)
 It was a pleasure caring for you today.  Please follow-up with your primary care provider and orthopedic provider.  Seek emergency care if experiencing any new or worsening symptoms.  Alternating between 650 mg Tylenol and 400 mg Advil: The best way to alternate taking Acetaminophen (example Tylenol) and Ibuprofen (example Advil/Motrin) is to take them 3 hours apart. For example, if you take ibuprofen at 6 am you can then take Tylenol at 9 am. You can continue this regimen throughout the day, making sure you do not exceed the recommended maximum dose for each drug.

## 2023-11-11 ENCOUNTER — Ambulatory Visit: Payer: 59 | Admitting: Podiatry

## 2023-11-16 ENCOUNTER — Ambulatory Visit (INDEPENDENT_AMBULATORY_CARE_PROVIDER_SITE_OTHER): Admitting: Podiatry

## 2023-11-16 DIAGNOSIS — Z91199 Patient's noncompliance with other medical treatment and regimen due to unspecified reason: Secondary | ICD-10-CM

## 2023-11-16 NOTE — Progress Notes (Signed)
 Cancel,  sick

## 2023-12-04 ENCOUNTER — Emergency Department (HOSPITAL_BASED_OUTPATIENT_CLINIC_OR_DEPARTMENT_OTHER)
Admission: EM | Admit: 2023-12-04 | Discharge: 2023-12-04 | Disposition: A | Discharge status: Home / Self Care | Attending: Emergency Medicine | Admitting: Emergency Medicine

## 2023-12-04 ENCOUNTER — Emergency Department (HOSPITAL_BASED_OUTPATIENT_CLINIC_OR_DEPARTMENT_OTHER)

## 2023-12-04 ENCOUNTER — Other Ambulatory Visit: Payer: Self-pay

## 2023-12-04 ENCOUNTER — Encounter (HOSPITAL_BASED_OUTPATIENT_CLINIC_OR_DEPARTMENT_OTHER): Payer: Self-pay

## 2023-12-04 DIAGNOSIS — R1084 Generalized abdominal pain: Secondary | ICD-10-CM | POA: Insufficient documentation

## 2023-12-04 DIAGNOSIS — R109 Unspecified abdominal pain: Secondary | ICD-10-CM | POA: Diagnosis present

## 2023-12-04 DIAGNOSIS — Z7982 Long term (current) use of aspirin: Secondary | ICD-10-CM | POA: Insufficient documentation

## 2023-12-04 LAB — COMPREHENSIVE METABOLIC PANEL WITH GFR
ALT: 13 U/L (ref 0–44)
AST: 17 U/L (ref 15–41)
Albumin: 4.2 g/dL (ref 3.5–5.0)
Alkaline Phosphatase: 90 U/L (ref 38–126)
Anion gap: 8 (ref 5–15)
BUN: 13 mg/dL (ref 8–23)
CO2: 25 mmol/L (ref 22–32)
Calcium: 9.8 mg/dL (ref 8.9–10.3)
Chloride: 106 mmol/L (ref 98–111)
Creatinine, Ser: 0.62 mg/dL (ref 0.44–1.00)
GFR, Estimated: >60 mL/min (ref >60)
Glucose, Bld: 88 mg/dL (ref 70–99)
Potassium: 4.4 mmol/L (ref 3.5–5.1)
Sodium: 140 mmol/L (ref 135–145)
Total Bilirubin: 0.2 mg/dL (ref 0.0–1.2)
Total Protein: 6.5 g/dL (ref 6.5–8.1)

## 2023-12-04 LAB — URINALYSIS, ROUTINE W REFLEX MICROSCOPIC
Bacteria, UA: NONE SEEN
Bilirubin Urine: NEGATIVE
Glucose, UA: NEGATIVE mg/dL
Ketones, ur: NEGATIVE mg/dL
Leukocytes,Ua: NEGATIVE
Nitrite: NEGATIVE
Protein, ur: NEGATIVE mg/dL
Specific Gravity, Urine: 1.026 (ref 1.005–1.030)
pH: 6 (ref 5.0–8.0)

## 2023-12-04 LAB — LIPASE, BLOOD: Lipase: 72 U/L — ABNORMAL HIGH (ref 11–51)

## 2023-12-04 LAB — CBC
HCT: 40.6 % (ref 36.0–46.0)
Hemoglobin: 13.2 g/dL (ref 12.0–15.0)
MCH: 31.1 pg (ref 26.0–34.0)
MCHC: 32.5 g/dL (ref 30.0–36.0)
MCV: 95.8 fL (ref 80.0–100.0)
Platelets: 222 10*3/uL (ref 150–400)
RBC: 4.24 MIL/uL (ref 3.87–5.11)
RDW: 13.5 % (ref 11.5–15.5)
WBC: 6.6 10*3/uL (ref 4.0–10.5)
nRBC: 0 % (ref 0.0–0.2)

## 2023-12-04 MED ORDER — ONDANSETRON 4 MG PO TBDP
4.0000 mg | ORAL_TABLET | Freq: Once | ORAL | Status: AC | PRN
  Administered 2023-12-04: 4 mg via ORAL
  Filled 2023-12-04: qty 1

## 2023-12-04 MED ORDER — IOHEXOL 300 MG/ML  SOLN
100.0000 mL | Freq: Once | INTRAMUSCULAR | Status: AC | PRN
Start: 2023-12-04 — End: 2023-12-04
  Administered 2023-12-04: 100 mL via INTRAVENOUS

## 2023-12-04 MED ORDER — MORPHINE SULFATE (PF) 4 MG/ML IV SOLN
4.0000 mg | Freq: Once | INTRAVENOUS | Status: AC
  Administered 2023-12-04: 4 mg via INTRAVENOUS
  Filled 2023-12-04: qty 1

## 2023-12-04 MED ORDER — PANTOPRAZOLE SODIUM 40 MG PO TBEC
40.0000 mg | DELAYED_RELEASE_TABLET | Freq: Every day | ORAL | 0 refills | Status: AC
Start: 2023-12-04 — End: ?

## 2023-12-04 NOTE — ED Notes (Signed)
 Patient very drowsy from pain medication, vitals stable.  Patient drove herself to the ED, advised she can be discharged once fully awake.

## 2023-12-04 NOTE — ED Provider Notes (Signed)
 [place] EMERGENCY DEPARTMENT AT [facility]  Provider Note  CSN: [id number] Arrival date & time: 12/04/23 0251  History Chief Complaint  Patient presents with   Abdominal Pain    Erin Craig is a 63 y.o. female with remote history of cholecystectomy reports 2 days of diffuse abdominal pain, some nausea, no vomiting. No diarrhea/constipation, no dysuria. Had similar a few years ago, told it was gastritis/GERD. She had taken some recent NSAIDs for knee but stopped a few weeks ago.    Home Medications Prior to Admission medications   Medication Sig Start Date End Date Taking? Authorizing Provider  ASPIRIN  LOW DOSE 81 MG tablet Take 81 mg by mouth daily. 1-4 tablets   Yes [provider]  cyanocobalamin (,VITAMIN B-12,) 1000 MCG/ML injection Inject 1,000 mcg into the muscle every 30 (thirty) days. 04/07/16  Yes [provider]  cyclobenzaprine  (FLEXERIL ) 10 MG tablet Take 1 tablet (10 mg total) by mouth 2 (two) times daily as needed for muscle spasms. 09/29/23  Yes Russella Courts A, DO  MOUNJARO 2.5 MG/0.5ML Pen SMARTSIG:2.5 Milligram(s) SUB-Q Once a Week   Yes [provider]  pantoprazole (PROTONIX) 40 MG tablet Take 1 tablet (40 mg total) by mouth daily. 12/04/23  Yes Charmayne Cooper, MD  acetaminophen  (TYLENOL ) 325 MG tablet Take 2 tablets (650 mg total) by mouth every 6 (six) hours as needed. 09/29/23   Teddi Favors, DO  diclofenac  Sodium (VOLTAREN ) 1 % GEL Apply 4 g topically 4 (four) times daily. 04/07/22   Darlis Eisenmenger, PA-C  fluticasone  (CUTIVATE ) 0.005 % ointment SMARTSIG:sparingly Topical Twice Daily 08/05/23   [provider]  lidocaine  (LIDODERM ) 5 % Place 1 patch onto the skin daily as needed. Remove & Discard patch within 12 hours or as directed by MD 09/29/23   Russella Courts A, DO  ondansetron  (ZOFRAN ) 4 MG tablet Take 1 tablet (4 mg total) by mouth every 8 (eight) hours as needed for nausea or vomiting. 12/01/21   Tegeler,  Marine Sia, MD  atorvastatin (LIPITOR) 10 MG tablet Take 10 mg by mouth daily.  07/14/19  [provider]  furosemide  (LASIX ) 20 MG tablet Take 1 tablet (20 mg total) by mouth daily. 01/09/18 07/14/19  Maryanna Smart, PA-C  glipiZIDE (GLUCOTROL) 5 MG tablet Take 10 mg by mouth daily before breakfast.  07/14/19  [provider]  lisinopril (PRINIVIL,ZESTRIL) 5 MG tablet Take 5 mg by mouth daily.  07/14/19  [provider]     Allergies    Patient has no known allergies.   Review of Systems   Review of Systems Please see HPI for pertinent positives and negatives  Physical Exam BP 123/72   Pulse (!) 43   Temp 98.1 F (36.7 C) (Oral)   Resp 15   Ht 5\' 4"  (1.626 m)   Wt 67.1 kg   LMP  (LMP Unknown)   SpO2 98%   BMI 25.40 kg/m   Physical Exam Vitals and nursing note reviewed.  Constitutional:      Appearance: Normal appearance.  HENT:     Head: Normocephalic and atraumatic.     Nose: Nose normal.     Mouth/Throat:     Mouth: Mucous membranes are moist.  Eyes:     Extraocular Movements: Extraocular movements intact.     Conjunctiva/sclera: Conjunctivae normal.  Cardiovascular:     Rate and Rhythm: Normal rate.  Pulmonary:     Effort: Pulmonary effort is normal.  Breath sounds: Normal breath sounds.  Abdominal:     General: Abdomen is flat.     Palpations: Abdomen is soft.     Tenderness: There is generalized abdominal tenderness. There is no guarding. Negative signs include Murphy's sign and McBurney's sign.  Musculoskeletal:        General: No swelling. Normal range of motion.     Cervical back: Neck supple.  Skin:    General: Skin is warm and dry.  Neurological:     General: No focal deficit present.     Mental Status: She is alert.  Psychiatric:        Mood and Affect: Mood normal.     ED Results / Procedures / Treatments   EKG None  Procedures Procedures  Medications Ordered in the ED Medications  ondansetron   (ZOFRAN -ODT) disintegrating tablet 4 mg (4 mg Oral Given 12/04/23 0314)  morphine  (PF) 4 MG/ML injection 4 mg (4 mg Intravenous Given 12/04/23 0514)  iohexol  (OMNIPAQUE ) 300 MG/ML solution 100 mL (100 mLs Intravenous Contrast Given 12/04/23 0545)    Initial Impression and Plan  Patient here with nonspecific abdominal pain, consider PUD, gastritis, SBO, pancreatitis. Less likely diverticulitis or appendicitis. Labs done in triage show unremarkable CBC, CMP, and UA. Lipase is mildly elevated but not 3 times upper limit of normal.   ED Course   Clinical Course as of 12/04/23 6578  Sat Dec 04, 2023  0604 Patient sleeping soundly on re-eval. Abdomen remains benign although patient reports no change in pain level after morphine . I personally viewed the images from radiology studies and agree with radiologist interpretation: CT neg for acute process, consistent with prior cholecystectomy. Will start PPI, recommend GI follow up, avoid NSAIDs. RTED for any other concerns.  [CS]    Clinical Course User Index [CS] Charmayne Cooper, MD     MDM Rules/Calculators/A&P Medical Decision Making Problems Addressed: Generalized abdominal pain: acute illness or injury  Amount and/or Complexity of Data Reviewed Labs: ordered. Decision-making details documented in ED Course. Radiology: ordered and independent interpretation performed. Decision-making details documented in ED Course.  Risk Prescription drug management. Parenteral controlled substances.     Final Clinical Impression(s) / ED Diagnoses Final diagnoses:  Generalized abdominal pain    Rx / DC Orders ED Discharge Orders          Ordered    pantoprazole (PROTONIX) 40 MG tablet  Daily        12/04/23 0606             Charmayne Cooper, MD 12/04/23 [phone]

## 2023-12-04 NOTE — ED Triage Notes (Signed)
 Patient reports feeling sick and only eating a small piece of a sandwich around 5pm. Tonight she started having abdominal pain that woke her up from sleep. Pain is all over the abdomen Endorses nausea. Denies vomiting, diarrhea or constipation. Denies black or tarry stool. Denies blood thinners. LBM was yesterday.

## 2023-12-04 NOTE — ED Notes (Signed)
 Dc instructions reviewed with patient. Patient voiced understanding. Dc with belongings.

## 2024-01-10 ENCOUNTER — Ambulatory Visit: Admitting: Podiatry

## 2024-02-23 ENCOUNTER — Telehealth: Payer: Self-pay | Admitting: Gastroenterology

## 2024-02-23 NOTE — Telephone Encounter (Signed)
 Patient was referred to be seen for IBS. Patient was seen within the last year with Dr. Rollin. Patient is requesting to transfer her care due to feel as though her symptoms were not being heard or treated and was unhappy with care provided. Patient will have records sent for review.

## 2024-02-26 ENCOUNTER — Emergency Department (HOSPITAL_BASED_OUTPATIENT_CLINIC_OR_DEPARTMENT_OTHER)
Admission: EM | Admit: 2024-02-26 | Discharge: 2024-02-26 | Disposition: A | Discharge status: Home / Self Care | Attending: Emergency Medicine | Admitting: Emergency Medicine

## 2024-02-26 ENCOUNTER — Other Ambulatory Visit: Payer: Self-pay

## 2024-02-26 ENCOUNTER — Encounter (HOSPITAL_BASED_OUTPATIENT_CLINIC_OR_DEPARTMENT_OTHER): Payer: Self-pay | Admitting: Emergency Medicine

## 2024-02-26 DIAGNOSIS — K529 Noninfective gastroenteritis and colitis, unspecified: Secondary | ICD-10-CM | POA: Diagnosis not present

## 2024-02-26 DIAGNOSIS — R197 Diarrhea, unspecified: Secondary | ICD-10-CM | POA: Diagnosis present

## 2024-02-26 DIAGNOSIS — Z7984 Long term (current) use of oral hypoglycemic drugs: Secondary | ICD-10-CM | POA: Diagnosis not present

## 2024-02-26 LAB — CBC
HCT: 36.6 % (ref 36.0–46.0)
Hemoglobin: 12.1 g/dL (ref 12.0–15.0)
MCH: 31.3 pg (ref 26.0–34.0)
MCHC: 33.1 g/dL (ref 30.0–36.0)
MCV: 94.8 fL (ref 80.0–100.0)
Platelets: 223 K/uL (ref 150–400)
RBC: 3.86 MIL/uL — ABNORMAL LOW (ref 3.87–5.11)
RDW: 12.8 % (ref 11.5–15.5)
WBC: 7.7 K/uL (ref 4.0–10.5)
nRBC: 0 % (ref 0.0–0.2)

## 2024-02-26 LAB — COMPREHENSIVE METABOLIC PANEL WITH GFR
ALT: 11 U/L (ref 0–44)
AST: 15 U/L (ref 15–41)
Albumin: 4.2 g/dL (ref 3.5–5.0)
Alkaline Phosphatase: 106 U/L (ref 38–126)
Anion gap: 11 (ref 5–15)
BUN: 17 mg/dL (ref 8–23)
CO2: 23 mmol/L (ref 22–32)
Calcium: 9.8 mg/dL (ref 8.9–10.3)
Chloride: 106 mmol/L (ref 98–111)
Creatinine, Ser: 0.68 mg/dL (ref 0.44–1.00)
GFR, Estimated: >60 mL/min (ref >60)
Glucose, Bld: 126 mg/dL — ABNORMAL HIGH (ref 70–99)
Potassium: 3.8 mmol/L (ref 3.5–5.1)
Sodium: 140 mmol/L (ref 135–145)
Total Bilirubin: 0.2 mg/dL (ref 0.0–1.2)
Total Protein: 6.8 g/dL (ref 6.5–8.1)

## 2024-02-26 LAB — LIPASE, BLOOD: Lipase: 44 U/L (ref 11–51)

## 2024-02-26 MED ORDER — COLESTIPOL HCL 1 G PO TABS: 1.0000 g | ORAL_TABLET | Freq: Every day | ORAL | 0 refills | Status: AC

## 2024-02-26 NOTE — ED Triage Notes (Signed)
 Pt reports concern for generalized abdominal pain associated with increased gas and diarrhea ongoing since the beginning of the year. Was seen by Dr.Hong her GI specialist since with no prescribed medications.

## 2024-02-26 NOTE — ED Provider Notes (Signed)
 [place] EMERGENCY DEPARTMENT AT [facility]  Provider Note  CSN: [id number] Arrival date & time: 02/26/24 9476  History Chief Complaint  Patient presents with   Abdominal Pain    Erin Craig is a 63 y.o. female with history of cholecystectomy reports several months of abdominal bloating, gas pains and frequent explosive diarrhea. She was seen in the ED in Apr with a negative workup including CT. She has been to GI and PCP multiple times for same, but does not feel her GI doctor has been taking her symptoms seriously and she is seeking a second opinion.Denies any vomiting or fever. No blood in stools. No urinary symptoms.    Home Medications Prior to Admission medications   Medication Sig Start Date End Date Taking? Authorizing Provider  colestipol  (COLESTID ) 1 g tablet Take 1 tablet (1 g total) by mouth daily. 02/26/24  Yes Roselyn Carlin NOVAK, MD  acetaminophen  (TYLENOL ) 325 MG tablet Take 2 tablets (650 mg total) by mouth every 6 (six) hours as needed. 09/29/23   Elnor Jayson LABOR, DO  ASPIRIN  LOW DOSE 81 MG tablet Take 81 mg by mouth daily. 1-4 tablets    [provider]  cyanocobalamin (,VITAMIN B-12,) 1000 MCG/ML injection Inject 1,000 mcg into the muscle every 30 (thirty) days. 04/07/16   [provider]  cyclobenzaprine  (FLEXERIL ) 10 MG tablet Take 1 tablet (10 mg total) by mouth 2 (two) times daily as needed for muscle spasms. 09/29/23   Elnor Jayson LABOR, DO  diclofenac  Sodium (VOLTAREN ) 1 % GEL Apply 4 g topically 4 (four) times daily. 04/07/22   Beverley Leita LABOR, PA-C  fluticasone  (CUTIVATE ) 0.005 % ointment SMARTSIG:sparingly Topical Twice Daily 08/05/23   [provider]  lidocaine  (LIDODERM ) 5 % Place 1 patch onto the skin daily as needed. Remove & Discard patch within 12 hours or as directed by MD 09/29/23   Elnor Jayson LABOR, DO  MOUNJARO 2.5 MG/0.5ML Pen SMARTSIG:2.5 Milligram(s) SUB-Q Once a Week    [provider]  ondansetron  (ZOFRAN ) 4  MG tablet Take 1 tablet (4 mg total) by mouth every 8 (eight) hours as needed for nausea or vomiting. 12/01/21   Tegeler, Lonni PARAS, MD  pantoprazole  (PROTONIX ) 40 MG tablet Take 1 tablet (40 mg total) by mouth daily. 12/04/23   Roselyn Carlin NOVAK, MD  atorvastatin (LIPITOR) 10 MG tablet Take 10 mg by mouth daily.  07/14/19  [provider]  furosemide  (LASIX ) 20 MG tablet Take 1 tablet (20 mg total) by mouth daily. 01/09/18 07/14/19  Odis Burnard Jansky, PA-C  glipiZIDE (GLUCOTROL) 5 MG tablet Take 10 mg by mouth daily before breakfast.  07/14/19  [provider]  lisinopril (PRINIVIL,ZESTRIL) 5 MG tablet Take 5 mg by mouth daily.  07/14/19  [provider]     Allergies    Patient has no known allergies.   Review of Systems   Review of Systems Please see HPI for pertinent positives and negatives  Physical Exam BP 115/71 (BP Location: Right Arm)   Pulse 75   Temp 97.8 F (36.6 C) (Oral)   Resp 19   LMP  (LMP Unknown)   SpO2 99%   Physical Exam Vitals and nursing note reviewed.  Constitutional:      Appearance: Normal appearance.  HENT:     Head: Normocephalic and atraumatic.     Nose: Nose normal.     Mouth/Throat:     Mouth: Mucous membranes are moist.  Eyes:     Extraocular  Movements: Extraocular movements intact.     Conjunctiva/sclera: Conjunctivae normal.  Cardiovascular:     Rate and Rhythm: Normal rate.  Pulmonary:     Effort: Pulmonary effort is normal.     Breath sounds: Normal breath sounds.  Abdominal:     General: Abdomen is flat.     Palpations: Abdomen is soft.     Tenderness: There is no abdominal tenderness. There is no guarding. Negative signs include McBurney's sign.  Musculoskeletal:        General: No swelling. Normal range of motion.     Cervical back: Neck supple.  Skin:    General: Skin is warm and dry.  Neurological:     General: No focal deficit present.     Mental Status: She is alert.  Psychiatric:        Mood  and Affect: Mood normal.     ED Results / Procedures / Treatments   EKG None  Procedures Procedures  Medications Ordered in the ED Medications - No data to display  Initial Impression and Plan  Patient here with several months of lower GI symptoms, suspect malabsorption, FODMAP, SIBO, etc as a cause. Will recheck labs to ensure no acute concerns. If negative plan trial Rx for Colestipol . She is already working to establish with [facility].   ED Course   Clinical Course as of 02/26/24 0650  Sat Feb 26, 2024  0634 CMP and Lipase are normal.  [CS]  0649 CBC is normal. Plan dispo as above with continued outpatient follow up.  [CS]    Clinical Course User Index [CS] Roselyn Carlin NOVAK, MD     MDM Rules/Calculators/A&P Medical Decision Making Problems Addressed: Chronic diarrhea: chronic illness or injury with exacerbation, progression, or side effects of treatment  Amount and/or Complexity of Data Reviewed Labs: ordered. Decision-making details documented in ED Course.  Risk Prescription drug management.     Final Clinical Impression(s) / ED Diagnoses Final diagnoses:  Chronic diarrhea    Rx / DC Orders ED Discharge Orders          Ordered    colestipol  (COLESTID ) 1 g tablet  Daily        02/26/24 0629             Roselyn Carlin NOVAK, MD 02/26/24 [phone]

## 2024-02-26 NOTE — ED Notes (Signed)
 Lav tube recollected and walked to the lab

## 2024-03-22 ENCOUNTER — Ambulatory Visit: Admitting: Podiatry

## 2024-03-22 ENCOUNTER — Encounter: Payer: Self-pay | Admitting: Podiatry

## 2024-03-22 DIAGNOSIS — L84 Corns and callosities: Secondary | ICD-10-CM

## 2024-03-22 DIAGNOSIS — E1165 Type 2 diabetes mellitus with hyperglycemia: Secondary | ICD-10-CM | POA: Diagnosis not present

## 2024-03-22 DIAGNOSIS — M2042 Other hammer toe(s) (acquired), left foot: Secondary | ICD-10-CM

## 2024-03-22 NOTE — Progress Notes (Signed)
  Subjective:  Patient ID: Erin Craig, female    DOB: 03-28-1961,   MRN: [id number]  Chief Complaint  Patient presents with   Diabetes    I'm going to hold off on doing the surgery.  I want her to look at the toe (2nd) on my left foot.  Both of my big toenails are ingrown.  Saw Dr. Alm Bilis - 2 weeks ago; A1c - 5.0    63 y.o. female presents for concern of thickened painful calussesthat are difficult to trim. Also relates some painful possible ingrown nails.  Requesting to have them trimmed today. Relates burning and tingling in their feet. Patient is diabetic and last A1c was 6.0.  PCP:  Bilis Alm, MD    Denies any other pedal complaints. Denies n/v/f/c.   Past Medical History:  Diagnosis Date   Bradycardia    Diabetes mellitus without complication (HCC)    Heart murmur    Hypercholesterolemia    Hypertension     Objective:  Physical Exam: Vascular: DP/PT pulses 2/4 bilateral. CFT <3 seconds. Normal hair growth on digits. No edema.  Skin. No lacerations or abrasions bilateral feet. Hyperkeratotic lesion noted to the lateral fifth metatarsal on the left and dorsum of the left second digit.  Musculoskeletal: MMT 5/5 bilateral lower extremities in DF, PF, Inversion and Eversion. Deceased ROM in DF of ankle joint. No pain over lateral ankle joint line. And no pain over ATFL. Negative anterior drawer signs. No pain along peroneal tendons. No pain medial around angle. No pain in achilles. No pain with DF, PF and no pain with STJ ROM.  Neurological: Sensation intact to light touch.   Assessment:   1. Callus of foot   2. Type 2 diabetes mellitus with hyperglycemia, without long-term current use of insulin  (HCC)   3. Hammertoe of left foot         Plan:  Patient was evaluated and treated and all questions answered. -Discussed and educated patient on diabetic foot care, especially with  regards to the vascular, neurological and musculoskeletal systems.  -Stressed the  importance of good glycemic control and the detriment of not  controlling glucose levels in relation to the foot. -Hyperkeratotic tissue was debrided with chisel without incident to left second digit and lateral fifth metatarsal head on the left.  -Slant back debridement as courtesy to bilateral medial hallux nails without incident covered with neosporin and band aid.  -Educated on hammertoes and treatment options  -Discussed padding including toe caps and crest pads.  -Discussed need for potential surgery if pain does not improved. Discussed surgery and perioperative course however patient does not have the ability to take time off of work right now.  -Patient to return  in 3 months for at risk foot care -Patient advised to call the office if any problems or questions arise in the meantime.    Asberry Failing, DPM

## 2024-06-21 ENCOUNTER — Encounter: Payer: Self-pay | Admitting: Podiatry

## 2024-06-21 ENCOUNTER — Ambulatory Visit (INDEPENDENT_AMBULATORY_CARE_PROVIDER_SITE_OTHER): Admitting: Podiatry

## 2024-06-21 DIAGNOSIS — L84 Corns and callosities: Secondary | ICD-10-CM

## 2024-06-21 DIAGNOSIS — E1165 Type 2 diabetes mellitus with hyperglycemia: Secondary | ICD-10-CM

## 2024-06-21 NOTE — Progress Notes (Signed)
  Subjective:  Patient ID: Erin Craig, female    DOB: 1961/08/03,   MRN: [id number]  Chief Complaint  Patient presents with   Diabetes    I need her to do just one little place on my toe.  I want her to check my ingrown toenails on my big toes.  Saw Dr. Alm Bilis - 06/16/2024; A1c - 6.0    63 y.o. female presents for concern of thickened painful calussesthat are difficult to trim. Also relates some painful possible ingrown nails.  Requesting to have them trimmed today. Relates burning and tingling in their feet. Patient is diabetic and last A1c was 6.0.  PCP:  Bilis Alm, MD    Denies any other pedal complaints. Denies n/v/f/c.   Past Medical History:  Diagnosis Date   Bradycardia    Diabetes mellitus without complication (HCC)    Heart murmur    Hypercholesterolemia    Hypertension     Objective:  Physical Exam: Vascular: DP/PT pulses 2/4 bilateral. CFT <3 seconds. Normal hair growth on digits. No edema.  Skin. No lacerations or abrasions bilateral feet. Hyperkeratotic lesion noted to the lateral fifth metatarsal on the left and dorsum of the left second digit.  Musculoskeletal: MMT 5/5 bilateral lower extremities in DF, PF, Inversion and Eversion. Deceased ROM in DF of ankle joint. No pain over lateral ankle joint line. No pain medial around angle. No pain in achilles. No pain with DF, PF and no pain with STJ ROM.  Neurological: Sensation intact to light touch.   Assessment:   1. Callus of foot   2. Type 2 diabetes mellitus with hyperglycemia, without long-term current use of insulin  (HCC)         Plan:  Patient was evaluated and treated and all questions answered. -Discussed and educated patient on diabetic foot care, especially with  regards to the vascular, neurological and musculoskeletal systems.  -Stressed the importance of good glycemic control and the detriment of not  controlling glucose levels in relation to the foot. -Hyperkeratotic tissue was  debrided with chisel without incident to left second digit and lateral fifth metatarsal head on the left.  -Slant back debridement as courtesy to bilateral medial hallux nails without incident covered with neosporin and band aid.  -Educated on hammertoes and treatment options  -Discussed padding including toe caps and crest pads.  -Discussed need for potential surgery if pain does not improved. Discussed surgery and perioperative course however patient does not have the ability to take time off of work right now.  -Patient to return  in 3 months for at risk foot care -Patient advised to call the office if any problems or questions arise in the meantime.    Asberry Failing, DPM

## 2024-07-21 ENCOUNTER — Encounter (HOSPITAL_BASED_OUTPATIENT_CLINIC_OR_DEPARTMENT_OTHER): Payer: Self-pay

## 2024-07-21 ENCOUNTER — Other Ambulatory Visit: Payer: Self-pay

## 2024-07-21 ENCOUNTER — Emergency Department (HOSPITAL_BASED_OUTPATIENT_CLINIC_OR_DEPARTMENT_OTHER)
Admission: EM | Admit: 2024-07-21 | Discharge: 2024-07-21 | Disposition: A | Discharge status: Home / Self Care | Attending: Emergency Medicine | Admitting: Emergency Medicine

## 2024-07-21 ENCOUNTER — Emergency Department (HOSPITAL_BASED_OUTPATIENT_CLINIC_OR_DEPARTMENT_OTHER)

## 2024-07-21 DIAGNOSIS — K625 Hemorrhage of anus and rectum: Secondary | ICD-10-CM | POA: Insufficient documentation

## 2024-07-21 DIAGNOSIS — Z7984 Long term (current) use of oral hypoglycemic drugs: Secondary | ICD-10-CM | POA: Insufficient documentation

## 2024-07-21 DIAGNOSIS — Z7982 Long term (current) use of aspirin: Secondary | ICD-10-CM | POA: Insufficient documentation

## 2024-07-21 DIAGNOSIS — R109 Unspecified abdominal pain: Secondary | ICD-10-CM | POA: Insufficient documentation

## 2024-07-21 LAB — CBC WITH DIFFERENTIAL/PLATELET
Abs Immature Granulocytes: 0.01 K/uL (ref 0.00–0.07)
Basophils Absolute: 0 K/uL (ref 0.0–0.1)
Basophils Relative: 0 %
Eosinophils Absolute: 0.2 K/uL (ref 0.0–0.5)
Eosinophils Relative: 2 %
HCT: 38.4 % (ref 36.0–46.0)
Hemoglobin: 12.6 g/dL (ref 12.0–15.0)
Immature Granulocytes: 0 %
Lymphocytes Relative: 36 %
Lymphs Abs: 3.1 K/uL (ref 0.7–4.0)
MCH: 31.3 pg (ref 26.0–34.0)
MCHC: 32.8 g/dL (ref 30.0–36.0)
MCV: 95.5 fL (ref 80.0–100.0)
Monocytes Absolute: 0.6 K/uL (ref 0.1–1.0)
Monocytes Relative: 7 %
Neutro Abs: 4.8 K/uL (ref 1.7–7.7)
Neutrophils Relative %: 55 %
Platelets: 208 K/uL (ref 150–400)
RBC: 4.02 MIL/uL (ref 3.87–5.11)
RDW: 13.5 % (ref 11.5–15.5)
WBC: 8.7 K/uL (ref 4.0–10.5)
nRBC: 0 % (ref 0.0–0.2)

## 2024-07-21 LAB — COMPREHENSIVE METABOLIC PANEL WITH GFR
ALT: 14 U/L (ref 0–44)
AST: 17 U/L (ref 15–41)
Albumin: 4.2 g/dL (ref 3.5–5.0)
Alkaline Phosphatase: 97 U/L (ref 38–126)
Anion gap: 10 (ref 5–15)
BUN: 18 mg/dL (ref 8–23)
CO2: 27 mmol/L (ref 22–32)
Calcium: 9.6 mg/dL (ref 8.9–10.3)
Chloride: 104 mmol/L (ref 98–111)
Creatinine, Ser: 0.66 mg/dL (ref 0.44–1.00)
GFR, Estimated: >60 mL/min (ref >60)
Glucose, Bld: 89 mg/dL (ref 70–99)
Potassium: 3.8 mmol/L (ref 3.5–5.1)
Sodium: 141 mmol/L (ref 135–145)
Total Bilirubin: 0.4 mg/dL (ref 0.0–1.2)
Total Protein: 7.2 g/dL (ref 6.5–8.1)

## 2024-07-21 MED ORDER — IOHEXOL 350 MG/ML SOLN
100.0000 mL | Freq: Once | INTRAVENOUS | Status: AC | PRN
  Administered 2024-07-21: 100 mL via INTRAVENOUS

## 2024-07-21 NOTE — ED Triage Notes (Signed)
 Pt POV reporting rectal bleeding after colonoscopy yesterday, several clots seen in underwear tonight, denies weakness or SOB, called on call physician and advised to be seen in ED.

## 2024-07-21 NOTE — Discharge Instructions (Signed)
 Overall we suspect blood came from your biopsy site.  Hopefully this will not occur again but please return if you have very heavy bleeding.  Recommend you call your GI doctor if this occurs again as well.  Lab work and imaging today are normal.

## 2024-07-21 NOTE — ED Provider Notes (Signed)
 [place] EMERGENCY DEPARTMENT AT [facility] Provider Note   CSN: [id number] Arrival date & time: 07/21/24  2053     Patient presents with: Rectal Bleeding   Erin Craig is a 63 y.o. female.   Patient here with passing blood clots from her rectum.  She had a colonoscopy yesterday.  Sounds like she has some biopsies but had a normal colonoscopy otherwise.  She was sent in for evaluation by her GI doctor.  Erin Craig performed colonoscopy.  He is having abdominal discomfort but she always has some abdominal discomfort.  She denies any fevers or chills.  She is only passed 2 dark clots this afternoon.  She denies any fevers or chills.  No nausea vomit diarrhea.  The history is provided by the patient.       Prior to Admission medications  Medication Sig Start Date End Date Taking? Authorizing Provider  acetaminophen  (TYLENOL ) 325 MG tablet Take 2 tablets (650 mg total) by mouth every 6 (six) hours as needed. 09/29/23   Elnor Jayson LABOR, DO  ASPIRIN  LOW DOSE 81 MG tablet Take 81 mg by mouth daily. 1-4 tablets    [provider]  colestipol  (COLESTID ) 1 g tablet Take 1 tablet (1 g total) by mouth daily. 02/26/24   Roselyn Carlin NOVAK, MD  cyanocobalamin (,VITAMIN B-12,) 1000 MCG/ML injection Inject 1,000 mcg into the muscle every 30 (thirty) days. 04/07/16   [provider]  cyclobenzaprine  (FLEXERIL ) 10 MG tablet Take 1 tablet (10 mg total) by mouth 2 (two) times daily as needed for muscle spasms. 09/29/23   Elnor Jayson LABOR, DO  diclofenac  Sodium (VOLTAREN ) 1 % GEL Apply 4 g topically 4 (four) times daily. 04/07/22   Beverley Leita LABOR, PA-C  fluticasone  (CUTIVATE ) 0.005 % ointment SMARTSIG:sparingly Topical Twice Daily 08/05/23   [provider]  lidocaine  (LIDODERM ) 5 % Place 1 patch onto the skin daily as needed. Remove & Discard patch within 12 hours or as directed by MD 09/29/23   Elnor Jayson LABOR, DO  MOUNJARO 2.5 MG/0.5ML Pen SMARTSIG:2.5 Milligram(s) SUB-Q  Once a Week    [provider]  ondansetron  (ZOFRAN ) 4 MG tablet Take 1 tablet (4 mg total) by mouth every 8 (eight) hours as needed for nausea or vomiting. 12/01/21   Tegeler, Lonni PARAS, MD  pantoprazole  (PROTONIX ) 40 MG tablet Take 1 tablet (40 mg total) by mouth daily. 12/04/23   Roselyn Carlin NOVAK, MD  atorvastatin (LIPITOR) 10 MG tablet Take 10 mg by mouth daily.  07/14/19  [provider]  furosemide  (LASIX ) 20 MG tablet Take 1 tablet (20 mg total) by mouth daily. 01/09/18 07/14/19  Odis Burnard Jansky, PA-C  glipiZIDE (GLUCOTROL) 5 MG tablet Take 10 mg by mouth daily before breakfast.  07/14/19  [provider]  lisinopril (PRINIVIL,ZESTRIL) 5 MG tablet Take 5 mg by mouth daily.  07/14/19  [provider]    Allergies: Patient has no known allergies.    Review of Systems  Updated Vital Signs BP 138/77   Pulse (!) 59   Temp 98 F (36.7 C) (Temporal)   Resp 19   Ht 5' 4 (1.626 m)   Wt 71.7 kg   LMP  (LMP Unknown)   SpO2 100%   BMI 27.12 kg/m   Physical Exam Vitals and nursing note reviewed.  Constitutional:      General: She is not in acute distress.    Appearance: She is well-developed. She is not ill-appearing.  HENT:  Head: Normocephalic and atraumatic.     Nose: Nose normal.     Mouth/Throat:     Mouth: Mucous membranes are moist.  Eyes:     Extraocular Movements: Extraocular movements intact.     Conjunctiva/sclera: Conjunctivae normal.     Pupils: Pupils are equal, round, and reactive to light.  Cardiovascular:     Rate and Rhythm: Normal rate and regular rhythm.     Pulses: Normal pulses.     Heart sounds: Normal heart sounds. No murmur heard. Pulmonary:     Effort: Pulmonary effort is normal. No respiratory distress.     Breath sounds: Normal breath sounds.  Abdominal:     Palpations: Abdomen is soft.     Tenderness: There is abdominal tenderness.  Musculoskeletal:        General: No swelling.     Cervical back:  Normal range of motion and neck supple.  Skin:    General: Skin is warm and dry.     Capillary Refill: Capillary refill takes less than 2 seconds.  Neurological:     Mental Status: She is alert.  Psychiatric:        Mood and Affect: Mood normal.     (all labs ordered are listed, but only abnormal results are displayed) Labs Reviewed  CBC WITH DIFFERENTIAL/PLATELET  COMPREHENSIVE METABOLIC PANEL WITH GFR    EKG: None  Radiology: CT ANGIO GI BLEED Result Date: 07/21/2024 EXAM: CTA ABDOMEN AND PELVIS WITH CONTRAST 07/21/2024 10:36:49 PM TECHNIQUE: CTA images of the abdomen and pelvis with intravenous contrast. 100 mL of iohexol  (OMNIPAQUE ) 350 MG/ML injection. Three-dimensional MIP/volume rendered formations were performed. Automated exposure control, iterative reconstruction, and/or weight based adjustment of the mA/kV was utilized to reduce the radiation dose to as low as reasonably achievable. COMPARISON: None available. CLINICAL HISTORY: bleeding post colonoscopy FINDINGS: VASCULATURE: GI BLEED: No active extravasation of contrast within the GI tract. AORTA: Calcific changes of the aorta are noted without aneurysmal dilatation. No dissection. CELIAC TRUNK: No acute finding. No occlusion or significant stenosis. SUPERIOR MESENTERIC ARTERY: No acute finding. No occlusion or significant stenosis. INFERIOR MESENTERIC ARTERY: No acute finding. No occlusion or significant stenosis. RENAL ARTERIES: No acute finding. No occlusion or significant stenosis. ILIAC ARTERIES: No acute finding. No occlusion or significant stenosis. ABDOMEN/PELVIS: LOWER CHEST: Lung bases are free of acute infiltrate or sizable effusion. LIVER: The liver is within normal limits. GALLBLADDER AND BILE DUCTS: The gallbladder has been surgically removed. No biliary ductal dilatation. SPLEEN: The spleen is unremarkable. PANCREAS: The pancreas is unremarkable. ADRENAL GLANDS: The adrenal glands are within normal limits. KIDNEYS,  URETERS AND BLADDER: A normal enhancement pattern of the kidneys is seen. No renal calculi or obstructive changes are noted. The bladder is partially distended. GI AND BOWEL: Stomach and duodenal sweep demonstrate no acute abnormality. No obstructive or inflammatory changes of the colon are seen. The appendix is within normal limits. There is no bowel obstruction. No abnormal bowel wall thickening or distension. REPRODUCTIVE: The uterus has been surgically removed. PERITONEUM AND RETROPERITONEUM: No free fluid is noted. No free air. LYMPH NODES: No lymphadenopathy is seen. BONES AND SOFT TISSUES: No bony abnormality is seen. No acute soft tissue abnormality. IMPRESSION: 1. No active GI bleeding. 2. No occlusion or hemodynamically significant stenosis of the abdominal or pelvic arterial system. 3. No abdominal aortic aneurysm or aortic dissection. Electronically signed by: Erin Devonshire MD 07/21/2024 10:45 PM EST RP Workstation: [id number]     Procedures   Medications Ordered in  the ED  iohexol  (OMNIPAQUE ) 350 MG/ML injection 100 mL (100 mLs Intravenous Contrast Given 07/21/24 2236)                                    Medical Decision Making Amount and/or Complexity of Data Reviewed Labs: ordered. Radiology: ordered.  Risk Prescription drug management.   Kaysie Michelini is here after she passed some blood clots.  She had a colonoscopy yesterday.  Per my review of the report she has some biopsies but otherwise had normal mucosa.  She has chronic abdominal pain.  Not on any blood thinners.  I talked with Dr. Leigh who is covering for Erin Craig.  Blood work was normal.  Hemoglobin is 12.6.  BUN is normal.  Her hemoglobin is at her baseline.  Suspect that blood is likely from biopsy sites and should resolve but given her abdominal pain we will get a CT scan to further evaluate any complications from the colonoscopy.  If CT scans unremarkable once we discharged home.  Patient understands the plan.   She had normal vitals.  She is well-appearing.  Anticipate discharge.  Overall CT scan per radiology report is unremarkable.  I do think that this was blood from her biopsies.  She stable.  Discharged in good condition.  Can follow-up with GI told to return if symptoms worsen.  This chart was dictated using voice recognition software.  Despite best efforts to proofread,  errors can occur which can change the documentation meaning.      Final diagnoses:  Rectal bleeding    ED Discharge Orders     None          Erin Cornet, DO 07/21/24 2248

## 2024-09-20 ENCOUNTER — Ambulatory Visit: Admitting: Podiatry
